# Patient Record
Sex: Male | Born: 1965 | Race: White | Hispanic: No | Smoking: Never smoker
Health system: Southern US, Community
[De-identification: ages and names within clinical notes are randomized; demographics above are authoritative.]

## PROBLEM LIST (undated history)

## (undated) DIAGNOSIS — T7840XA Allergy, unspecified, initial encounter: Secondary | ICD-10-CM

## (undated) DIAGNOSIS — E039 Hypothyroidism, unspecified: Secondary | ICD-10-CM

## (undated) DIAGNOSIS — K828 Other specified diseases of gallbladder: Secondary | ICD-10-CM

## (undated) DIAGNOSIS — T8859XA Other complications of anesthesia, initial encounter: Secondary | ICD-10-CM

## (undated) DIAGNOSIS — F411 Generalized anxiety disorder: Secondary | ICD-10-CM

## (undated) DIAGNOSIS — K229 Disease of esophagus, unspecified: Secondary | ICD-10-CM

## (undated) DIAGNOSIS — E663 Overweight: Secondary | ICD-10-CM

## (undated) DIAGNOSIS — K760 Fatty (change of) liver, not elsewhere classified: Secondary | ICD-10-CM

## (undated) DIAGNOSIS — K5792 Diverticulitis of intestine, part unspecified, without perforation or abscess without bleeding: Secondary | ICD-10-CM

## (undated) DIAGNOSIS — K649 Unspecified hemorrhoids: Secondary | ICD-10-CM

## (undated) DIAGNOSIS — T4145XA Adverse effect of unspecified anesthetic, initial encounter: Secondary | ICD-10-CM

## (undated) DIAGNOSIS — M1990 Unspecified osteoarthritis, unspecified site: Secondary | ICD-10-CM

## (undated) DIAGNOSIS — K219 Gastro-esophageal reflux disease without esophagitis: Secondary | ICD-10-CM

## (undated) DIAGNOSIS — Z87442 Personal history of urinary calculi: Secondary | ICD-10-CM

## (undated) DIAGNOSIS — Z8619 Personal history of other infectious and parasitic diseases: Secondary | ICD-10-CM

## (undated) DIAGNOSIS — K589 Irritable bowel syndrome without diarrhea: Secondary | ICD-10-CM

## (undated) DIAGNOSIS — K910 Vomiting following gastrointestinal surgery: Secondary | ICD-10-CM

## (undated) DIAGNOSIS — I10 Essential (primary) hypertension: Secondary | ICD-10-CM

## (undated) DIAGNOSIS — R112 Nausea with vomiting, unspecified: Secondary | ICD-10-CM

## (undated) HISTORY — DX: Overweight: E66.3

## (undated) HISTORY — DX: Hypothyroidism, unspecified: E03.9

## (undated) HISTORY — DX: Personal history of urinary calculi: Z87.442

## (undated) HISTORY — DX: Gastro-esophageal reflux disease without esophagitis: K21.9

## (undated) HISTORY — DX: Allergy, unspecified, initial encounter: T78.40XA

## (undated) HISTORY — DX: Personal history of other infectious and parasitic diseases: Z86.19

## (undated) HISTORY — PX: LIVER BIOPSY: SHX301

## (undated) HISTORY — DX: Fatty (change of) liver, not elsewhere classified: K76.0

## (undated) HISTORY — DX: Unspecified hemorrhoids: K64.9

## (undated) HISTORY — PX: TONSILLECTOMY AND ADENOIDECTOMY: SUR1326

## (undated) HISTORY — DX: Unspecified osteoarthritis, unspecified site: M19.90

## (undated) HISTORY — DX: Diverticulitis of intestine, part unspecified, without perforation or abscess without bleeding: K57.92

## (undated) HISTORY — DX: Generalized anxiety disorder: F41.1

## (undated) HISTORY — PX: DIAGNOSTIC LAPAROSCOPY: SUR761

## (undated) HISTORY — DX: Irritable bowel syndrome, unspecified: K58.9

---

## 2001-08-31 ENCOUNTER — Encounter: Payer: Self-pay | Admitting: Urology

## 2001-08-31 ENCOUNTER — Encounter: Admission: RE | Admit: 2001-08-31 | Discharge: 2001-08-31 | Payer: Self-pay | Admitting: Urology

## 2001-09-03 ENCOUNTER — Encounter: Payer: Self-pay | Admitting: Urology

## 2001-09-03 ENCOUNTER — Encounter: Admission: RE | Admit: 2001-09-03 | Discharge: 2001-09-03 | Payer: Self-pay | Admitting: Urology

## 2005-08-16 ENCOUNTER — Ambulatory Visit: Payer: Self-pay | Admitting: Pulmonary Disease

## 2005-12-20 ENCOUNTER — Ambulatory Visit: Payer: Self-pay | Admitting: Pulmonary Disease

## 2006-04-06 ENCOUNTER — Ambulatory Visit (HOSPITAL_COMMUNITY): Admission: RE | Admit: 2006-04-06 | Discharge: 2006-04-06 | Payer: Self-pay | Admitting: Pulmonary Disease

## 2006-04-06 ENCOUNTER — Ambulatory Visit: Payer: Self-pay | Admitting: Pulmonary Disease

## 2006-12-19 ENCOUNTER — Ambulatory Visit: Payer: Self-pay | Admitting: Pulmonary Disease

## 2007-02-15 ENCOUNTER — Ambulatory Visit: Payer: Self-pay | Admitting: Pulmonary Disease

## 2007-02-15 LAB — CONVERTED CEMR LAB
ALT: 100 units/L — ABNORMAL HIGH (ref 0–40)
AST: 61 units/L — ABNORMAL HIGH (ref 0–37)
Albumin: 4.1 g/dL (ref 3.5–5.2)
Alkaline Phosphatase: 71 units/L (ref 39–117)
BUN: 11 mg/dL (ref 6–23)
Basophils Absolute: 0 10*3/uL (ref 0.0–0.1)
Basophils Relative: 0.4 % (ref 0.0–1.0)
Bilirubin Urine: NEGATIVE
Bilirubin, Direct: 0.2 mg/dL (ref 0.0–0.3)
CO2: 30 meq/L (ref 19–32)
Calcium: 9.2 mg/dL (ref 8.4–10.5)
Chloride: 102 meq/L (ref 96–112)
Cholesterol: 143 mg/dL (ref 0–200)
Creatinine, Ser: 0.9 mg/dL (ref 0.4–1.5)
Eosinophils Absolute: 0.1 10*3/uL (ref 0.0–0.6)
Eosinophils Relative: 2 % (ref 0.0–5.0)
GFR calc Af Amer: 120 mL/min
GFR calc non Af Amer: 99 mL/min
Glucose, Bld: 96 mg/dL (ref 70–99)
HCT: 42.9 % (ref 39.0–52.0)
HDL: 30.1 mg/dL — ABNORMAL LOW (ref >39.0)
Hemoglobin, Urine: NEGATIVE
Hemoglobin: 15.2 g/dL (ref 13.0–17.0)
LDL Cholesterol: 75 mg/dL (ref 0–99)
Leukocytes, UA: NEGATIVE
Lymphocytes Relative: 40.4 % (ref 12.0–46.0)
MCHC: 35.6 g/dL (ref 30.0–36.0)
MCV: 92.6 fL (ref 78.0–100.0)
Monocytes Absolute: 0.6 10*3/uL (ref 0.2–0.7)
Monocytes Relative: 9.5 % (ref 3.0–11.0)
Neutro Abs: 3.3 10*3/uL (ref 1.4–7.7)
Neutrophils Relative %: 47.7 % (ref 43.0–77.0)
Nitrite: NEGATIVE
Platelets: 269 10*3/uL (ref 150–400)
Potassium: 3.6 meq/L (ref 3.5–5.1)
RBC: 4.63 M/uL (ref 4.22–5.81)
RDW: 11.9 % (ref 11.5–14.6)
Sodium: 139 meq/L (ref 135–145)
Specific Gravity, Urine: 1.025 (ref 1.000–1.03)
TSH: 20.48 microintl units/mL — ABNORMAL HIGH (ref 0.35–5.50)
Total Bilirubin: 1.4 mg/dL — ABNORMAL HIGH (ref 0.3–1.2)
Total CHOL/HDL Ratio: 4.8
Total Protein, Urine: NEGATIVE mg/dL
Total Protein: 7.2 g/dL (ref 6.0–8.3)
Triglycerides: 190 mg/dL — ABNORMAL HIGH (ref 0–149)
Urine Glucose: NEGATIVE mg/dL
Urobilinogen, UA: 0.2 (ref 0.0–1.0)
VLDL: 38 mg/dL (ref 0–40)
WBC: 6.7 10*3/uL (ref 4.5–10.5)
pH: 6 (ref 5.0–8.0)

## 2007-02-19 ENCOUNTER — Ambulatory Visit: Payer: Self-pay | Admitting: Pulmonary Disease

## 2007-06-19 ENCOUNTER — Ambulatory Visit: Payer: Self-pay | Admitting: Pulmonary Disease

## 2007-12-19 DIAGNOSIS — F411 Generalized anxiety disorder: Secondary | ICD-10-CM

## 2007-12-19 DIAGNOSIS — I10 Essential (primary) hypertension: Secondary | ICD-10-CM | POA: Insufficient documentation

## 2007-12-19 DIAGNOSIS — K219 Gastro-esophageal reflux disease without esophagitis: Secondary | ICD-10-CM | POA: Insufficient documentation

## 2007-12-19 DIAGNOSIS — E039 Hypothyroidism, unspecified: Secondary | ICD-10-CM

## 2007-12-19 DIAGNOSIS — K589 Irritable bowel syndrome without diarrhea: Secondary | ICD-10-CM | POA: Insufficient documentation

## 2007-12-19 DIAGNOSIS — K649 Unspecified hemorrhoids: Secondary | ICD-10-CM | POA: Insufficient documentation

## 2008-01-14 ENCOUNTER — Ambulatory Visit: Payer: Self-pay | Admitting: Pulmonary Disease

## 2008-01-14 DIAGNOSIS — E7800 Pure hypercholesterolemia, unspecified: Secondary | ICD-10-CM | POA: Insufficient documentation

## 2008-01-14 DIAGNOSIS — K7689 Other specified diseases of liver: Secondary | ICD-10-CM | POA: Insufficient documentation

## 2008-01-14 DIAGNOSIS — E663 Overweight: Secondary | ICD-10-CM | POA: Insufficient documentation

## 2008-07-08 ENCOUNTER — Ambulatory Visit: Payer: Self-pay | Admitting: Internal Medicine

## 2008-07-08 DIAGNOSIS — J069 Acute upper respiratory infection, unspecified: Secondary | ICD-10-CM | POA: Insufficient documentation

## 2008-07-15 ENCOUNTER — Telehealth: Payer: Self-pay | Admitting: Adult Health

## 2008-07-21 ENCOUNTER — Telehealth (INDEPENDENT_AMBULATORY_CARE_PROVIDER_SITE_OTHER): Payer: Self-pay | Admitting: *Deleted

## 2008-07-22 ENCOUNTER — Ambulatory Visit: Payer: Self-pay | Admitting: Pulmonary Disease

## 2008-07-22 DIAGNOSIS — J209 Acute bronchitis, unspecified: Secondary | ICD-10-CM | POA: Insufficient documentation

## 2008-08-25 ENCOUNTER — Ambulatory Visit: Payer: Self-pay | Admitting: Pulmonary Disease

## 2008-08-27 ENCOUNTER — Ambulatory Visit: Payer: Self-pay | Admitting: Pulmonary Disease

## 2008-08-31 LAB — CONVERTED CEMR LAB
ALT: 62 units/L — ABNORMAL HIGH (ref 0–53)
Basophils Absolute: 0 10*3/uL (ref 0.0–0.1)
Basophils Relative: 0.4 % (ref 0.0–3.0)
Bilirubin Urine: NEGATIVE
CO2: 30 meq/L (ref 19–32)
Calcium: 9.1 mg/dL (ref 8.4–10.5)
Cholesterol: 143 mg/dL (ref 0–200)
Creatinine, Ser: 0.9 mg/dL (ref 0.4–1.5)
GFR calc Af Amer: 120 mL/min
HCT: 43 % (ref 39.0–52.0)
HDL: 19.5 mg/dL — ABNORMAL LOW (ref >39.0)
Hemoglobin: 15.5 g/dL (ref 13.0–17.0)
Ketones, ur: NEGATIVE mg/dL
Leukocytes, UA: NEGATIVE
Lymphocytes Relative: 37.1 % (ref 12.0–46.0)
MCHC: 36 g/dL (ref 30.0–36.0)
MCV: 94.4 fL (ref 78.0–100.0)
Monocytes Absolute: 0.6 10*3/uL (ref 0.1–1.0)
Neutro Abs: 2.7 10*3/uL (ref 1.4–7.7)
RBC: 4.56 M/uL (ref 4.22–5.81)
RDW: 12.8 % (ref 11.5–14.6)
Specific Gravity, Urine: 1.01 (ref 1.000–1.03)
TSH: 6.65 microintl units/mL — ABNORMAL HIGH (ref 0.35–5.50)
Total Bilirubin: 1.6 mg/dL — ABNORMAL HIGH (ref 0.3–1.2)
Total Protein, Urine: NEGATIVE mg/dL
Total Protein: 7.1 g/dL (ref 6.0–8.3)
Triglycerides: 123 mg/dL (ref 0–149)
Urine Glucose: NEGATIVE mg/dL
pH: 7 (ref 5.0–8.0)

## 2008-12-15 ENCOUNTER — Telehealth (INDEPENDENT_AMBULATORY_CARE_PROVIDER_SITE_OTHER): Payer: Self-pay | Admitting: *Deleted

## 2008-12-24 ENCOUNTER — Telehealth (INDEPENDENT_AMBULATORY_CARE_PROVIDER_SITE_OTHER): Payer: Self-pay | Admitting: *Deleted

## 2009-01-01 ENCOUNTER — Telehealth (INDEPENDENT_AMBULATORY_CARE_PROVIDER_SITE_OTHER): Payer: Self-pay | Admitting: *Deleted

## 2009-01-13 ENCOUNTER — Encounter: Payer: Self-pay | Admitting: Adult Health

## 2009-01-13 ENCOUNTER — Ambulatory Visit: Payer: Self-pay | Admitting: Pulmonary Disease

## 2009-01-13 DIAGNOSIS — R300 Dysuria: Secondary | ICD-10-CM | POA: Insufficient documentation

## 2009-01-13 DIAGNOSIS — R1084 Generalized abdominal pain: Secondary | ICD-10-CM | POA: Insufficient documentation

## 2009-01-13 LAB — CONVERTED CEMR LAB
ALT: 39 units/L (ref 0–53)
Bilirubin Urine: NEGATIVE
Bilirubin, Direct: 0.2 mg/dL (ref 0.0–0.3)
CO2: 31 meq/L (ref 19–32)
Calcium: 9.2 mg/dL (ref 8.4–10.5)
Crystals: NEGATIVE
GFR calc Af Amer: 137 mL/min
Hemoglobin, Urine: NEGATIVE
Hemoglobin, Urine: NEGATIVE
Leukocytes, UA: NEGATIVE
Lymphocytes Relative: 20.7 % (ref 12.0–46.0)
Monocytes Absolute: 0.8 10*3/uL (ref 0.1–1.0)
Monocytes Relative: 8.1 % (ref 3.0–12.0)
Neutrophils Relative %: 68.2 % (ref 43.0–77.0)
Nitrite: NEGATIVE
Nitrite: NEGATIVE
Platelets: 271 10*3/uL (ref 150–400)
RDW: 11.8 % (ref 11.5–14.6)
Sodium: 140 meq/L (ref 135–145)
Total Bilirubin: 0.9 mg/dL (ref 0.3–1.2)
Total Protein, Urine: NEGATIVE mg/dL
WBC, UA: NONE SEEN cells/hpf
pH: 6.5 (ref 5.0–8.0)

## 2009-01-19 ENCOUNTER — Telehealth (INDEPENDENT_AMBULATORY_CARE_PROVIDER_SITE_OTHER): Payer: Self-pay | Admitting: *Deleted

## 2009-02-27 ENCOUNTER — Ambulatory Visit: Payer: Self-pay | Admitting: Pulmonary Disease

## 2009-03-02 ENCOUNTER — Ambulatory Visit: Payer: Self-pay | Admitting: Pulmonary Disease

## 2009-03-02 LAB — CONVERTED CEMR LAB: TSH: 92.9 microintl units/mL — ABNORMAL HIGH (ref 0.35–5.50)

## 2009-11-16 ENCOUNTER — Ambulatory Visit: Payer: Self-pay | Admitting: Pulmonary Disease

## 2009-11-23 ENCOUNTER — Telehealth (INDEPENDENT_AMBULATORY_CARE_PROVIDER_SITE_OTHER): Payer: Self-pay | Admitting: *Deleted

## 2009-12-12 HISTORY — PX: KNEE SURGERY: SHX244

## 2010-01-11 ENCOUNTER — Telehealth: Payer: Self-pay | Admitting: Pulmonary Disease

## 2010-01-13 ENCOUNTER — Telehealth (INDEPENDENT_AMBULATORY_CARE_PROVIDER_SITE_OTHER): Payer: Self-pay | Admitting: *Deleted

## 2010-03-19 ENCOUNTER — Ambulatory Visit: Payer: Self-pay | Admitting: Pulmonary Disease

## 2010-03-19 LAB — CONVERTED CEMR LAB
Albumin: 4.4 g/dL (ref 3.5–5.2)
BUN: 10 mg/dL (ref 6–23)
Calcium: 9.3 mg/dL (ref 8.4–10.5)
Cholesterol: 130 mg/dL (ref 0–200)
Eosinophils Relative: 3 % (ref 0.0–5.0)
GFR calc non Af Amer: 97.82 mL/min (ref >60)
Glucose, Bld: 94 mg/dL (ref 70–99)
HCT: 47.8 % (ref 39.0–52.0)
HDL: 32.6 mg/dL — ABNORMAL LOW (ref >39.00)
Hemoglobin: 16.9 g/dL (ref 13.0–17.0)
Ketones, ur: NEGATIVE mg/dL
Leukocytes, UA: NEGATIVE
Lymphocytes Relative: 37.6 % (ref 12.0–46.0)
Lymphs Abs: 2.6 10*3/uL (ref 0.7–4.0)
Monocytes Relative: 11.6 % (ref 3.0–12.0)
Neutro Abs: 3.3 10*3/uL (ref 1.4–7.7)
Platelets: 250 10*3/uL (ref 150.0–400.0)
Specific Gravity, Urine: 1.015 (ref 1.000–1.030)
TSH: 0.61 microintl units/mL (ref 0.35–5.50)
Triglycerides: 94 mg/dL (ref 0.0–149.0)
VLDL: 18.8 mg/dL (ref 0.0–40.0)
WBC: 6.9 10*3/uL (ref 4.5–10.5)
pH: 7 (ref 5.0–8.0)

## 2010-09-27 ENCOUNTER — Telehealth: Payer: Self-pay | Admitting: Pulmonary Disease

## 2010-10-26 ENCOUNTER — Encounter: Payer: Self-pay | Admitting: Cardiovascular Disease

## 2010-10-26 ENCOUNTER — Ambulatory Visit: Payer: Self-pay

## 2010-10-26 DIAGNOSIS — M7989 Other specified soft tissue disorders: Secondary | ICD-10-CM

## 2011-01-02 ENCOUNTER — Encounter: Payer: Self-pay | Admitting: Pulmonary Disease

## 2011-01-11 NOTE — Progress Notes (Signed)
Summary: rx  Phone Note Call from Patient Call back at Home Phone [phone]   Caller: Patient Call For: nadel Reason for Call: Talk to Nurse Summary of Call: sinus drainage, has been stopped up, cough, not in chest.  Can you call something in? [facility] Initial call taken by: Eugene Gavia,  January 11, 2010 1:55 PM  Follow-up for Phone Call        called and lmomtcb for pt. [name] CMA  January 11, 2010 1:57 PM   started friday night with congestion---stopped up nose--using advil sinus meds---drainage started 4-5 hours ago---clear drainage--had avelox last month for the same thing---please advise.  thanks  allergies--pcn [name] CMA  January 11, 2010 2:09 PM   Additional Follow-up for Phone Call Additional follow up Details #1::        probably viral , needs to use mucinex, saline REC: zpack to have on hold if symptoms worsen with discolored mucus  Zpack #1 take as directed , no refills Please contact office for sooner follow up if symptoms do not improve or worsen  Additional Follow-up by: Rubye Oaks NP,  January 11, 2010 2:23 PM    Additional Follow-up for Phone Call Additional follow up Details #2::    called and spoke with pt ---he is aware per TP recs--will hold the zpak and take only if he needs it with change in nasal drainage---use mucinex and nasal saline spray.  pt instructed to call back later this week if not better [name] CMA  January 11, 2010 2:27 PM   New/Updated Medications: ZITHROMAX Z-PAK 250 MG TABS (AZITHROMYCIN) take as directed Prescriptions: ZITHROMAX Z-PAK 250 MG TABS (AZITHROMYCIN) take as directed  #1 pak x 0   Entered by:   [name] CMA   Authorized by:   [name] MD   Signed by:   [name] CMA on 01/11/2010   Method used:   Electronically to        [facility] [address] [phone]* (retail)       [address], [place]  [address]       Ph: [phone]       Fax: [phone]   RxID:    [id number]

## 2011-01-11 NOTE — Assessment & Plan Note (Signed)
Summary: cpx/fasting/apc   CC:  Yearly ROv & CPX....  History of Present Illness: 45 y/o WM here for a follow up visit & CPX... he has multiple medical problems as noted below...    ~  March 02, 2009:  he informs me that he now has a new Engineering geologist thru work where he pays the first $6,000 out of pocket (w/ a health savings acct helping) & lower premiums for the rest of the plan (catastrophic coverage)... he has to pay for everything initially, meds included, except for a Physical Exam (?including CXR, EKG, labs), which is covered completely once per year... we discussed his med Rx- Levothyroid & Hyzaar- he will check w/ Pharm re: cash cost of the ARB's.   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  also has some right lat epicondylitis pain- try soaks, heat MOBIC (may need injection)... BP appears controlled on medication;  trying to control Chol w/ diet alone & intol to Cres10 last yr;  weight is about the same and desperately needs to get wt down, esp in view of his steatosis.    Current Problem List:  HYPERTENSION (ICD-401.9) - controlled on HYZAAR 100-25 daily... BP=114/80 today, & he states OK at home  ~130's to 140/ 80's-90... denies HA, fatigue, visual changes, CP, palipit, dizziness, syncope, dyspnea, edema, etc...   ~  CXR 4/11 showed low lung vol's but clear, normal Cor, NAD.  ~  EKG showed NSR rate 60, no signif STTWA's, WNL.  HYPERCHOLESTEROLEMIA (ICD-272.0) - he has refused meds preferring to attempt control on diet alone... he was intol to Niacin in past w/ flushing... intol to Crestor10 when tried in Sep09...  ~  FLP 3/08 showed TChol 143, TG 190, HDL 30, LDL 75... rec to start fibrate- he declined meds.  ~  FLP  9/09 showed TChol 143, TG 123, HDL 20, LDL 99... rec to start Crestor10mg /d- he agrees.  ~  pt states that he didn't tolerate the Crestor10 either, therefore stopped it.  ~  FLP 4/11 showed TChol 130, TG 94, HDL 33, LDL 79... much improved, on diet alone.  HYPOTHYROIDISM (ICD-244.9) - on SYNTHROID 260mcg/d w/ ?compliance issues...   ~  labs 3/08 showed TSH=20.5 on Synthroid 174mcg/d and dose increased to 200.  ~  labs 9/09 showed TSH= 6.65 on 231mcg/d... continue same med.  ~  ran out of meds Feb10 and didn't refill- labs 3/10 showed TSH= 93... rec> restart LEVOTHY200.  ~  labs 4/11 showed TSH= 0.61... clinically euthyroid, may need to cut back dose.  OVERWEIGHT (ICD-278.02) - since we first met [name] in 1997 his weight has been 220-230 range... occas down to 210-215, and once down to 205# in 2001... hightest weight has been 232#.[name].  ~  weight 3/10 = 230#  ~  weight 4/11 = 228#  GERD (ICD-530.81) - uses OTC Prilosec as needed.  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 he uses OTC PrepH.  FATTY LIVER DISEASE (ICD-571.8) -  he has hepatic steatosis w/ prev GI eval 1999 by [facility] including a liver Bx... there is a pos family hx of cirrhosis in his father who died from  this disease... he has been counselled on weight reduction and told to avoid hepatotoxins including alcohol...  he has discussed Hepatic Steatosis/ Fatty Liver Disease w/ GI [facility]), and we have provided him w/ mult hand-out about this condition...  he understands that it can lead to cirrhosis and death from liver failure... he understands that he must get his weight down, control his fat intake, and follow the FLP & LFT's carefully...  ~  LFT's 3/08 showed SGOT= 61, SGPT= 100... rec> get wt down.  ~  LFT's 9/09 showed SGOT= 43, SGPT= 62... improved.  ~  LFT's 2/10 showed SGOT= 29, SGPT= 39... normal!  ~  LFT's 4/11 showed SGOT= 60, SGPT= 97... must diet & get wt  down!  ANXIETY (ICD-300.00) - uses Alprazolam Prn...  DERM - mult tattoos...  Health Maintenance -   ~  GU:  age 68, PSA checked 2/10 due to symptoms and normal at 0.85...  ~  GI:  had FlexSig in 1998 for IBS symptoms by [facility] & it was neg...  ~  Immunizations:  ? last Tetanus shot (he agrees to TDAP today)... hasn't had Pneumovax, Flu shots (refuses), or HepB series...   Allergies: 1)  ! Penicillin 2)  ! Niacin 3)  ! Crestor (Rosuvastatin Calcium)  Comments:  Nurse/Medical Assistant: The patient's medications and allergies were reviewed with the patient and were updated in the Medication and Allergy Lists.  Past History:  Past Medical History: HYPERTENSION (ICD-401.9) HYPERCHOLESTEROLEMIA (ICD-272.0) HYPOTHYROIDISM (ICD-244.9) OVERWEIGHT (ICD-278.02) GERD (ICD-530.81) IRRITABLE BOWEL SYNDROME (ICD-564.1) HEMORRHOIDS (ICD-455.6) FATTY LIVER DISEASE (ICD-571.8) ANXIETY (ICD-300.00)  Family History: Reviewed history from 11/16/2009 and no changes required. allergies - mother, MGM heart disease - MGF rheumatism - father cancer - father (kidney) DM - father, PGF  Social History: Reviewed history from 03/02/2009 and no changes required. Married Children Never Smoked Social alcohol Works for [employer]  Review of Systems       The patient complains of joint pain and paresthesias.  The patient denies fever, chills, sweats, anorexia, fatigue, weakness, malaise, weight loss, sleep disorder, blurring, diplopia, eye irritation, eye discharge, vision loss, eye pain, photophobia, earache, ear discharge, tinnitus, decreased hearing, nasal congestion, nosebleeds, sore throat, hoarseness, chest pain, palpitations, syncope, dyspnea on exertion, orthopnea, PND, peripheral edema, cough, dyspnea at rest, excessive sputum, hemoptysis, wheezing, pleurisy, nausea, vomiting, diarrhea, constipation, change in bowel habits, abdominal pain, melena, hematochezia, jaundice,  gas/bloating, indigestion/heartburn, dysphagia, odynophagia, dysuria, hematuria, urinary frequency, urinary hesitancy, nocturia, incontinence, back pain, joint swelling, muscle cramps, muscle weakness, stiffness, arthritis, sciatica, restless legs, leg pain at night, leg pain with exertion, rash, itching, dryness, suspicious lesions, paralysis, seizures, tremors, vertigo, transient blindness, frequent falls, frequent headaches, difficulty walking, depression, anxiety, memory loss, confusion, cold intolerance, heat intolerance, polydipsia, polyphagia, polyuria, unusual weight change, abnormal bruising, bleeding, enlarged lymph nodes, urticaria, allergic rash, hay fever, and recurrent infections.    Vital Signs:  Patient profile:   45 year old male Height:      69 inches Weight:      227.31 pounds BMI:     33.69 O2 Sat:      99 % on Room air Temp:     96.8 degrees F oral Pulse rate:   62 / minute BP sitting:   114 / 80  (right arm) Cuff size:   regular  Vitals Entered By: [name] CMA (March 19, 2010 8:55 AM)  O2 Sat at Rest %:  99 O2 Flow:  Room air  CC: Yearly  ROv & CPX... Is Patient Diabetic? No Pain Assessment Patient in pain? yes      Comments meds updated today   Physical Exam  Additional Exam:  WD, Overweight, 45 y/o WM in NAD... GENERAL:  Alert & oriented; pleasant & cooperative... HEENT:  [place]/AT, EOM-wnl, PERRLA, EACs-clear, TMs-wnl, NOSE-clear, THROAT-clear & wnl... NECK:  Supple w/ full ROM; no JVD; normal carotid impulses w/o bruits; no thyromegaly or nodules palpated; no lymphadenopathy... CHEST:  Clear to P & A; without wheezes/ rales/ or rhonchi heard... HEART:  Regular Rhythm; without murmurs/ rubs/ or gallops detected... ABDOMEN:  Soft & nontender; normal bowel sounds; no organomegaly or masses palpated... RECTAL:  neg- prostate 2+ normal, stool heme neg, few ext hem's noted... EXT: without deformities, mild arthritic changes;   NEURO:  CN's intact; motor  testing normal; sensory testing normal; gait normal & balance OK. DERM:  No lesions noted; no rash, several tatoos...    CXR  Procedure date:  03/19/2010  Findings:      HEST - 2 VIEW Comparison: [facility] chest x-rays 07/22/2008 and 03/02/2009.   Findings: Persistent low lung volumes with clear lungs and normal heart size seen.  Mediastinum, hila, pleura and osseous structures appear normal for age.   IMPRESSION: Stable - no active disease.   Read By:  Barnie Del,  M.D.   EKG  Procedure date:  03/19/2010  Findings:      Normal sinus rhythm with rate of:  60/ min... Tracing is WNL, NAD...  SN   MISC. Report  Procedure date:  03/19/2010  Findings:      Lipid Panel (LIPID)   Cholesterol               130 mg/dL                   1-610   Triglycerides             94.0 mg/dL                  9.6-045.4   HDL                  [L]  09.81 mg/dL                 >19.14   LDL Cholesterol           79 mg/dL                    7-82         BMP (METABOL)   Sodium                    140 mEq/L                   135-145   Potassium                 3.6 mEq/L                   3.5-5.1   Chloride                  100 mEq/L                   96-112   Carbon Dioxide            31 mEq/L  19-32   Glucose                   94 mg/dL                    16-10   BUN                       10 mg/dL                    9-60   Creatinine                0.9 mg/dL                   4.5-4.0   Calcium                   9.3 mg/dL                   9.8-11.9   GFR                       97.82 mL/min                >60   Hepatic/Liver Function Panel (HEPATIC)   Total Bilirubin      [H]  1.6 mg/dL                   1.4-7.8   Direct Bilirubin          0.2 mg/dL                   2.9-5.6   Alkaline Phosphatase      61 U/L                      39-117   AST                  [H]  60 U/L                      0-37   ALT                  [H]  97 U/L                       0-53   Total Protein             7.8 g/dL                    2.1-3.0   Albumin                   4.4 g/dL                    8.6-5.7  Comments:      CBC Platelet w/Diff (CBCD)   White Cell Count          6.9 K/uL                    4.5-10.5   Red Cell Count            5.05 Mil/uL                 4.22-5.81   Hemoglobin                16.9 g/dL  13.0-17.0   Hematocrit                47.8 %                      39.0-52.0   MCV                       94.6 fl                     78.0-100.0   Platelet Count            250.0 K/uL                  150.0-400.0   Neutrophil %              47.4 %                      43.0-77.0   Lymphocyte %              37.6 %                      12.0-46.0   Monocyte %                11.6 %                      3.0-12.0   Eosinophils%              3.0 %                       0.0-5.0   Basophils %               0.4 %                       0.0-3.0   TSH (TSH)   FastTSH                   0.61 uIU/mL                 0.35-5.50   UDip w/Micro (URINE)   Color                     YELLOW   Clarity                   CLEAR                       Clear   Specific Gravity          1.015                       1.000 - 1.030   Urine Ph                  7.0                         5.0-8.0   Protein                   NEGATIVE                    Negative   Urine Glucose             NEGATIVE  Negative   Ketones                   NEGATIVE                    Negative   Urine Bilirubin           NEGATIVE                    Negative   Blood                     NEGATIVE                    Negative   Urobilinogen              0.2                         0.0 - 1.0   Leukocyte Esterace        NEGATIVE                    Negative   Nitrite                   NEGATIVE                    Negative   Urine WBC                 0-2/hpf                     0-2/hpf   Urine RBC                 0-2/hpf                     0-2/hpf   Urine Epith                Rare(0-4/hpf)               Rare(0-4/hpf)   Urine Bacteria            Rare(<10/hpf)               None   Impression & Recommendations:  Problem # 1:  PHYSICAL EXAMINATION (ICD-V70.0)  Orders: EKG w/ Interpretation (93000) T-2 View CXR (71020TC) TLB-Lipid Panel (80061-LIPID) TLB-BMP (Basic Metabolic Panel-BMET) (80048-METABOL) TLB-Hepatic/Liver Function Pnl (80076-HEPATIC) TLB-CBC Platelet - w/Differential (85025-CBCD) TLB-TSH (Thyroid Stimulating Hormone) (84443-TSH) TLB-Udip w/ Micro (81001-URINE)  Problem # 2:  HYPERTENSION (ICD-401.9) Controlled on Rx... continue same. His updated medication list for this problem includes:    Hyzaar 100-25 Mg Tabs (Losartan potassium-hctz) .[name]... Take 1 tab by mouth once daily...  Problem # 3:  HYPERCHOLESTEROLEMIA (ICD-272.0) Improved on his diet Rx... HDL still sl low- rec incr exercise!!!  Problem # 4:  HYPOTHYROIDISM (ICD-244.9) Amazing how well the meds work when you take them regularly... clinically euthyroid & TSH sl suppressed... continue same for now, we will watch... His updated medication list for this problem includes:    Levothyroxine Sodium 200 Mcg Tabs (Levothyroxine sodium) .[name]... Take 1 tab by mouth once daily...  Problem # 5:  FATTY LIVER DISEASE (ICD-571.8) LFT's are back up and diet, weight reduction are his only hope here- we discussed this in detail & he understands...  Problem # 6:  OVERWEIGHT (ICD-278.02) Weight reduction is key!!!  Problem # 7:  OTHER MEDICAL  PROBLEMS AS NOTED>>> OK TDAP today...  Complete Medication List: 1)  Adult Aspirin Low Strength 81 Mg Tbdp (Aspirin) .[name].. 1 tab daily.Marland KitchenMarland Kitchen 2)  Hyzaar 100-25 Mg Tabs (Losartan potassium-hctz) .... Take 1 tab by mouth once daily.Marland KitchenMarland Kitchen 3)  Levothyroxine Sodium 200 Mcg Tabs (Levothyroxine sodium) .... Take 1 tab by mouth once daily.Marland KitchenMarland Kitchen 4)  Multivitamins Tabs (Multiple vitamin) .... Take 1 tablet by mouth once a day 5)  Vitamin B-12 1000 Mcg Tabs (Cyanocobalamin) ....  Take 1 tablet by mouth once a day 6)  Meloxicam 7.5 Mg Tabs (Meloxicam) .... Take 1 tab by mouth once daily as needed for arthritis pain...  Other Orders: Prescription Created Electronically [phone]) Tdap => 32yrs IM (47829) Admin 1st Vaccine (56213)  Patient Instructions: 1)  Today we updated your med list- see below.... 2)  We refilled your meds for 90d supplies as requested... 3)  We wrote a new perscription for generic Mobic- take one tab daily w/ a meal as needed for arthritis/ bursitis/ tendonitis pain.Marland KitchenMarland Kitchen 4)  For your left elbow (prob ulnar neuropathy): you need to keep off the elbow while keyboarding, driving, etc... try a bulky elbow pad. 5)  For your right elbow pain (lat epicondylitis= "tennis elbow":  try the Mobic + hot soaks, etc... if symptoms persist we can ask Dr[facility]le to inject the area for you. 6)  Today we did your follow up CXR, EKG, and FASTING blood work... please call the "phone tree" in a few days for your lab results.Marland KitchenMarland Kitchen 7)  Remember to get on that diet & get the weight down... goal is to lose 15-20 lbs... 8)  Today we gave you a TETANUS vaccination (good for 16yrs). 9)  Please collect the "stool cards" at your convenience so that we can check for hidden blood... 10)  Call w/ ant questions... 11)  Please schedule a follow-up appointment in 1 year. Prescriptions: MELOXICAM 7.5 MG TABS (MELOXICAM) take 1 tab by mouth once daily as needed for arthritis pain...  #90 x 4   Entered and Authorized by:   [name] MD   Signed by:   [name] MD on 03/19/2010   Method used:   Print then Give to Patient   RxID:   [id number] LEVOTHYROXINE SODIUM 200 MCG TABS (LEVOTHYROXINE SODIUM) take 1 tab by mouth once daily...  #90 x 4   Entered and Authorized by:   [name] MD   Signed by:   [name] MD on 03/19/2010   Method used:   Print then Give to Patient   RxID:   [id number] HYZAAR 100-25 MG TABS (LOSARTAN POTASSIUM-HCTZ) take 1 tab by mouth once  daily...  #90 x 4   Entered and Authorized by:   [name] MD   Signed by:   [name] MD on 03/19/2010   Method used:   Print then Give to Patient   RxID:   [id number]    CardioPerfect ECG  ID: [id number] Patient: Paul Shannon A DOB: [birth year] Age: 45 Years Old Sex: Male Race: White Physician: Amiya Escamilla Technician: [name] CMA Height: 69 Weight: 227.31 Status: Unconfirmed Past Medical History:  HYPERTENSION (ICD-401.9) HYPERCHOLESTEROLEMIA (ICD-272.0) HYPOTHYROIDISM (ICD-244.9) OVERWEIGHT (ICD-278.02) GERD (ICD-530.81) IRRITABLE BOWEL SYNDROME (ICD-564.1) HEMORRHOIDS (ICD-455.6) FATTY LIVER DISEASE (ICD-571.8) ANXIETY (ICD-300.00)   Recorded: 03/19/2010 09:07 AM P/PR: 127 ms / 173 ms - Heart rate (maximum exercise) QRS: 110 QT/QTc/QTd: 427 ms / 427 ms / 42 ms - Heart rate (maximum exercise)  P/QRS/T  axis: 27 deg / 69 deg / 17 deg - Heart rate (maximum exercise)  Heartrate: 60 bpm  Interpretation:  Normal sinus rhythm with rate of:  60/ min... Tracing is WNL, NAD...  SN     Immunizations Administered:  Tetanus Vaccine:    Vaccine Type: Tdap    Site: left deltoid    Mfr: boostrix    Dose: 0.5 ml    Route: IM    Given by: [name] CMA    Exp. Date: 03/05/2012    Lot #: [id number]    VIS given: 10/30/07 version given March 19, 2010.

## 2011-01-11 NOTE — Progress Notes (Signed)
Summary: cough  Phone Note Call from Patient   Caller: Patient Call For: nadel Summary of Call: pt need something for a cough [facility]  [place] Initial call taken by: [name],  January 13, 2010 8:24 AM  Follow-up for Phone Call        The patient states he did not pick up abx and doesn't think he needs it but does c/o drainage and a dry hacky cough. He would like something called in for the cough. Please advise.[name] CMA  January 13, 2010 9:38 AM  Additional Follow-up for Phone Call Additional follow up Details #1::        per SN---use mucinex max 1200mg  1 by mouth two times a day with plenty of fluids and tussionex  #4oz  1 tsp by mouth every 12 hours as needed for cough.  thanks [name] CMA  January 13, 2010 2:24 PM   Patient is aware of SN recs and will call if his sxs do not improve or get worse.[name] CMA  January 13, 2010 2:32 PM    New/Updated Medications: MUCINEX MAXIMUM STRENGTH 1200 MG XR12H-TAB (GUAIFENESIN) 1 by mouth two times a day TUSSIONEX PENNKINETIC ER 8-10 MG/5ML LQCR (CHLORPHENIRAMINE-HYDROCODONE) 1 tsp every 12 hours as needed for cough Prescriptions: TUSSIONEX PENNKINETIC ER 8-10 MG/5ML LQCR (CHLORPHENIRAMINE-HYDROCODONE) 1 tsp every 12 hours as needed for cough  #4 oz x 0   Entered by:   [name] CMA   Authorized by:   [name] MD   Signed by:   [name] CMA on 01/13/2010   Method used:   Telephoned to ...       [facility] [address] [phone]* (retail)       [address] [place]  [address]       Ph: [phone]       Fax: [phone]   RxID:   [id number]

## 2011-01-11 NOTE — Progress Notes (Signed)
Summary: prescription  Phone Note Call from Patient Call back at Home Phone [phone]   Caller: Patient Call For: nadel Summary of Call: Wants a rx for zpak.//[facility] Initial call taken by: Darletta Moll,  September 27, 2010 8:23 AM  Follow-up for Phone Call        called and spoke with pt.  pt states she is scheduled to have knee surgery this weds and believes he is coming down with a sinus infection.  Pt requests abx.  Sx started on Friday 09/17/2010.  C/o facial presure, nasal congestion, PND, and sore throat. Pt denied fever or chest congestion.  Pt has tried OTC Zyrtec and antihistamines with no relief of symptoms.  Will forward message to SN to address.  Aundra Millet Reynolds LPN  September 27, 2010 9:05 AM  allergies: PCN, Niacin, Crestor  Additional Follow-up for Phone Call Additional follow up Details #1::        per SN---ok for pt to have zpak #1   take as directec until gone. thanks [name] CMA  September 27, 2010 12:30 PM     called and spoke with pt and he is aware of zpak sent to the pharmacy [name] CMA  September 27, 2010 12:49 PM     New/Updated Medications: ZITHROMAX Z-PAK 250 MG TABS (AZITHROMYCIN) take as directed Prescriptions: ZITHROMAX Z-PAK 250 MG TABS (AZITHROMYCIN) take as directed  #1 pak x 0   Entered by:   [name] CMA   Authorized by:   [name] MD   Signed by:   [name] CMA on 09/27/2010   Method used:   Faxed to ...       [facility] (retail)       [address]       [place], [place]    [place]       Ph: [phone]       Fax: [phone]   RxID:   [id number]

## 2011-01-11 NOTE — Miscellaneous (Signed)
Summary: Orders Update  Clinical Lists Changes  Problems: Added new problem of SWELLING OF LIMB (ICD-729.81) Orders: Added new Test order of Venous Duplex Lower Extremity (Venous Duplex Lower) - Signed 

## 2011-01-21 ENCOUNTER — Telehealth (INDEPENDENT_AMBULATORY_CARE_PROVIDER_SITE_OTHER): Payer: Self-pay | Admitting: *Deleted

## 2011-01-27 NOTE — Progress Notes (Signed)
Summary: congestion > z pak  Phone Note Call from Patient Call back at [facility] Phone [phone]   Caller: Patient Call For: nadel Summary of Call: Pt c/o pressure in sinus congestion  since yesterday wants a z-pak called in pls advsie.//[facility] [address] [place] Initial call taken by: Darletta Moll,  January 21, 2011 10:18 AM  Follow-up for Phone Call        Spoke with pt and he is c/o sinus pressure, head congestion, and sinus drainage x 2 days. Pt requesting zpak. Please advise. Carron Curie CMA  January 21, 2011 10:44 AM  PER SN, call in Zpak #1, use as directed.[name] [facility]  January 21, 2011 4:06 PM   Additional Follow-up for Phone Call Additional follow up Details #1::        Called, spoke with pt.  He is aware SN recs z pak - take as directed.  He verbalized understanding and aware rx sent to [facility].  Will call back if sxs worsen or do not improve. Additional Follow-up by: Gweneth Dimitri RN,  January 21, 2011 4:28 PM    New/Updated Medications: ZITHROMAX Z-PAK 250 MG TABS (AZITHROMYCIN) take as directed Prescriptions: ZITHROMAX Z-PAK 250 MG TABS (AZITHROMYCIN) take as directed  #1 x 0   Entered by:   Gweneth Dimitri RN   Authorized by:   [name] MD   Signed by:   Gweneth Dimitri RN on 01/21/2011   Method used:   Faxed to ...       [facility] (retail)       [address]       [place], [place]    [place]       Ph: [phone]       Fax: [phone]   RxID:   [id number]

## 2011-04-19 ENCOUNTER — Emergency Department (HOSPITAL_COMMUNITY): Payer: BC Managed Care – PPO

## 2011-04-19 ENCOUNTER — Inpatient Hospital Stay (HOSPITAL_COMMUNITY)
Admission: EM | Admit: 2011-04-19 | Discharge: 2011-04-23 | DRG: 182 | Disposition: A | Discharge status: Home / Self Care | Payer: BC Managed Care – PPO | Attending: Internal Medicine | Admitting: Internal Medicine

## 2011-04-19 DIAGNOSIS — E039 Hypothyroidism, unspecified: Secondary | ICD-10-CM | POA: Diagnosis present

## 2011-04-19 DIAGNOSIS — Z880 Allergy status to penicillin: Secondary | ICD-10-CM

## 2011-04-19 DIAGNOSIS — K5732 Diverticulitis of large intestine without perforation or abscess without bleeding: Principal | ICD-10-CM | POA: Diagnosis present

## 2011-04-19 DIAGNOSIS — E538 Deficiency of other specified B group vitamins: Secondary | ICD-10-CM | POA: Diagnosis present

## 2011-04-19 DIAGNOSIS — I10 Essential (primary) hypertension: Secondary | ICD-10-CM | POA: Diagnosis present

## 2011-04-19 DIAGNOSIS — E669 Obesity, unspecified: Secondary | ICD-10-CM | POA: Diagnosis present

## 2011-04-19 DIAGNOSIS — K7689 Other specified diseases of liver: Secondary | ICD-10-CM | POA: Diagnosis present

## 2011-04-19 DIAGNOSIS — Z7982 Long term (current) use of aspirin: Secondary | ICD-10-CM

## 2011-04-19 HISTORY — DX: Essential (primary) hypertension: I10

## 2011-04-19 LAB — BASIC METABOLIC PANEL
CO2: 26 mEq/L (ref 19–32)
Calcium: 9.4 mg/dL (ref 8.4–10.5)
Chloride: 99 mEq/L (ref 96–112)
GFR calc Af Amer: >60 mL/min (ref >60)
Sodium: 137 mEq/L (ref 135–145)

## 2011-04-19 LAB — HEPATIC FUNCTION PANEL
ALT: 82 U/L — ABNORMAL HIGH (ref 0–53)
Albumin: 4.1 g/dL (ref 3.5–5.2)
Alkaline Phosphatase: 81 U/L (ref 39–117)
Total Bilirubin: 1.3 mg/dL — ABNORMAL HIGH (ref 0.3–1.2)
Total Protein: 7.6 g/dL (ref 6.0–8.3)

## 2011-04-19 LAB — DIFFERENTIAL
Basophils Absolute: 0 10*3/uL (ref 0.0–0.1)
Lymphocytes Relative: 10 % — ABNORMAL LOW (ref 12–46)
Monocytes Relative: 8 % (ref 3–12)
Neutro Abs: 13.8 10*3/uL — ABNORMAL HIGH (ref 1.7–7.7)
Neutrophils Relative %: 82 % — ABNORMAL HIGH (ref 43–77)

## 2011-04-19 LAB — CBC
HCT: 45 % (ref 39.0–52.0)
Hemoglobin: 17 g/dL (ref 13.0–17.0)
WBC: 16.8 10*3/uL — ABNORMAL HIGH (ref 4.0–10.5)

## 2011-04-19 LAB — URINALYSIS, ROUTINE W REFLEX MICROSCOPIC
Glucose, UA: NEGATIVE mg/dL
Hgb urine dipstick: NEGATIVE
Specific Gravity, Urine: 1.024 (ref 1.005–1.030)
Urobilinogen, UA: 1 mg/dL (ref 0.0–1.0)

## 2011-04-19 LAB — PROTIME-INR: Prothrombin Time: 13.6 seconds (ref 11.6–15.2)

## 2011-04-19 MED ORDER — IOHEXOL 300 MG/ML  SOLN
80.0000 mL | Freq: Once | INTRAMUSCULAR | Status: AC | PRN
  Administered 2011-04-19: 80 mL via INTRAVENOUS

## 2011-04-20 LAB — COMPREHENSIVE METABOLIC PANEL
Alkaline Phosphatase: 60 U/L (ref 39–117)
BUN: 8 mg/dL (ref 6–23)
CO2: 26 mEq/L (ref 19–32)
Chloride: 101 mEq/L (ref 96–112)
Creatinine, Ser: 0.63 mg/dL (ref 0.4–1.5)
GFR calc non Af Amer: >60 mL/min (ref >60)
Glucose, Bld: 99 mg/dL (ref 70–99)
Potassium: 3.3 mEq/L — ABNORMAL LOW (ref 3.5–5.1)
Total Bilirubin: 2.4 mg/dL — ABNORMAL HIGH (ref 0.3–1.2)

## 2011-04-20 LAB — GLUCOSE, CAPILLARY: Glucose-Capillary: 134 mg/dL — ABNORMAL HIGH (ref 70–99)

## 2011-04-20 LAB — CBC
HCT: 38.4 % — ABNORMAL LOW (ref 39.0–52.0)
MCH: 32.3 pg (ref 26.0–34.0)
MCV: 89.9 fL (ref 78.0–100.0)
Platelets: 172 10*3/uL (ref 150–400)
RBC: 4.27 MIL/uL (ref 4.22–5.81)
WBC: 16.5 10*3/uL — ABNORMAL HIGH (ref 4.0–10.5)

## 2011-04-21 ENCOUNTER — Telehealth: Payer: Self-pay | Admitting: Pulmonary Disease

## 2011-04-21 ENCOUNTER — Inpatient Hospital Stay (HOSPITAL_COMMUNITY): Payer: BC Managed Care – PPO

## 2011-04-21 LAB — DIFFERENTIAL
Basophils Absolute: 0 10*3/uL (ref 0.0–0.1)
Basophils Relative: 0 % (ref 0–1)
Eosinophils Absolute: 0.1 10*3/uL (ref 0.0–0.7)
Monocytes Relative: 8 % (ref 3–12)
Neutro Abs: 8.6 10*3/uL — ABNORMAL HIGH (ref 1.7–7.7)
Neutrophils Relative %: 79 % — ABNORMAL HIGH (ref 43–77)

## 2011-04-21 LAB — BASIC METABOLIC PANEL
Chloride: 103 mEq/L (ref 96–112)
Creatinine, Ser: 0.69 mg/dL (ref 0.4–1.5)
GFR calc Af Amer: >60 mL/min (ref >60)
Sodium: 137 mEq/L (ref 135–145)

## 2011-04-21 LAB — GLUCOSE, CAPILLARY: Glucose-Capillary: 85 mg/dL (ref 70–99)

## 2011-04-21 LAB — CBC
Hemoglobin: 13.7 g/dL (ref 13.0–17.0)
MCH: 32.1 pg (ref 26.0–34.0)
Platelets: 162 10*3/uL (ref 150–400)
RBC: 4.27 MIL/uL (ref 4.22–5.81)
WBC: 10.9 10*3/uL — ABNORMAL HIGH (ref 4.0–10.5)

## 2011-04-21 NOTE — Telephone Encounter (Signed)
Will forward to Dr. Kriste Basque to make him aware pt would like him to call regarding pt dx. Please advise Dr. Kriste Basque. Thanks  Carver Fila, CMA

## 2011-04-22 ENCOUNTER — Inpatient Hospital Stay (HOSPITAL_COMMUNITY): Payer: BC Managed Care – PPO

## 2011-04-22 ENCOUNTER — Encounter (HOSPITAL_COMMUNITY): Payer: Self-pay | Admitting: Radiology

## 2011-04-22 LAB — CBC
HCT: 40.1 % (ref 39.0–52.0)
Platelets: 216 10*3/uL (ref 150–400)
RBC: 4.52 MIL/uL (ref 4.22–5.81)
RDW: 12.3 % (ref 11.5–15.5)
WBC: 8.3 10*3/uL (ref 4.0–10.5)

## 2011-04-22 LAB — BASIC METABOLIC PANEL
Chloride: 104 mEq/L (ref 96–112)
GFR calc non Af Amer: >60 mL/min (ref >60)
Potassium: 3.6 mEq/L (ref 3.5–5.1)
Sodium: 138 mEq/L (ref 135–145)

## 2011-04-22 MED ORDER — IOHEXOL 300 MG/ML  SOLN
100.0000 mL | Freq: Once | INTRAMUSCULAR | Status: AC | PRN
  Administered 2011-04-22: 100 mL via INTRAVENOUS

## 2011-04-22 NOTE — Telephone Encounter (Signed)
Dr. Kriste Basque has spoken with pt in the hospital

## 2011-04-23 LAB — CBC
HCT: 39.3 % (ref 39.0–52.0)
Hemoglobin: 14.5 g/dL (ref 13.0–17.0)
WBC: 7.8 10*3/uL (ref 4.0–10.5)

## 2011-04-23 LAB — BASIC METABOLIC PANEL
CO2: 23 mEq/L (ref 19–32)
Chloride: 104 mEq/L (ref 96–112)
Glucose, Bld: 87 mg/dL (ref 70–99)
Potassium: 3.3 mEq/L — ABNORMAL LOW (ref 3.5–5.1)
Sodium: 139 mEq/L (ref 135–145)

## 2011-04-24 NOTE — Discharge Summary (Signed)
[name], [name]                 ACCOUNT NO.:  [id number]  MEDICAL RECORD NO.:  [id number]           PATIENT TYPE:  I  LOCATION:  6741                         FACILITY:  [facility]  PHYSICIAN:  Jeoffrey Massed, MD    DATE OF BIRTH:  Aug 23, 1966  DATE OF ADMISSION:  04/19/2011 DATE OF DISCHARGE:                        DISCHARGE SUMMARY - REFERRING   PRIMARY CARE PRACTITIONER:  Lonzo Cloud. Kriste Basque, MD  PRIMARY GASTROENTEROLOGIST:  Petra Kuba, MD  PRIMARY DISCHARGE DIAGNOSIS:  Diverticulitis.  SECONDARY DISCHARGE DIAGNOSES:  Include the following; 1. Hypertension. 2. Hypothyroidism.  DISCHARGE MEDICATIONS: 1. Ciprofloxacin 500 mg 1 tablet p.o. twice daily for 7 more days. 2. Flagyl 500 mg p.o. every 8 hours for 7 more days. 3. Flora-Q 1 capsule p.o. daily. 4. Aspirin 81 mg 1 tablet daily. 5. Hyzaar 100/25 one tablet p.o. daily. 6. Multivitamins 1 tablet daily. 7. Synthroid 200 mcg 1 tablet p.o. daily. 8. Vitamin B12 of 1 tablet p.o. daily.  CONSULTATIONS:  Dr. Luisa Hart from [facility].  BRIEF HISTORY OF PRESENT ILLNESS:  The patient is a very pleasant 45- year-old male who presented to the ED on May 8 for abdominal pain. Further workup in the ED revealed the patient had diverticulitis.  The patient was then admitted to the hospitalist service for further evaluation and treatment.  For further details, please see the history and physical that was dictated by Dr. Mahala Menghini on admission.  PERTINENT RADIOLOGICAL STUDIES.: 1. CT of the abdomen and pelvis done Apr 19, 2011, showed sigmoid     diverticulosis with active diverticulitis. 2. Repeat CT of the abdomen on Apr 22, 2011, showed no significant     change in sigmoid colon diverticulitis with perforation and     pericolonic inflammation.  No organized abscess at this point.  PERTINENT LABORATORY DATA: 1. C. diff by PCR was negative. 2. CBC on admission showed a WBC of 16.8. 3. CBC on discharge showed WBC of  7.8.  BRIEF HOSPITAL COURSE: 1. Abdominal pain.  This was secondary to acute diverticulitis.  The     patient was admitted, kept n.p.o. and started on IV ciprofloxacin     and Flagyl.  Surgery was consulted by the admitting physician and     they agreed with conservative medical management.  The patient's     diet was slowly advanced.  His hospital stay was complicated by a     persistent pain and the fever at one point in time.  Repeat CT scan     of the abdomen was essentially unchanged per my discussion with Dr.     Rito Ehrlich, Radiology/Oncology yesterday.  Currently today the     patient claims significant symptomatic improvement and denies that     he has further pain at all.  He has been tolerating a full liquid     diet and does not want to eat in the hospital for any further and     is requesting to be discharged home.  I have instructed the patient     to stay on a soft mechanical diet for the next few weeks.  He     claims understanding.  The patient has also been instructed to     follow up with his primary gastroenterologist Dr. Ewing Schlein within 4-6     weeks for a colonoscopy.  He claims understanding and claims that     he can call and make these appointments. 2. Hypertension.  This is stable.  The patient is to resume his prior     medications on discharge. 3. Hypothyroidism.  The patient will continue Synthroid on discharge. 4. Disposition.  The patient is to be discharged home.  FOLLOWUP INSTRUCTIONS: 1. The patient to stay on a soft mechanical diet for the next few     weeks. 2. The patient needs to follow up with his primary care practitioner     Dr. Kriste Basque within 1-2 weeks upon discharge.  He is to call and make     an appointment.  He claims understanding. 3. The patient is to follow up with Dr. Ewing Schlein from [facility] who was     his primary gastroenterologist within 4-6 weeks upon discharge for     a colonoscopy.  The patient claims he can call and make      appointments.  Total time spent coordinating discharge 45 minutes.     Jeoffrey Massed, MD     SG/MEDQ  D:  04/23/2011  T:  04/23/2011  Job:  [id number]  cc:   Lonzo Cloud. Kriste Basque, MD Petra Kuba, M.D.  Electronically Signed by Jeoffrey Massed  on 04/24/2011 03:06:40 PM

## 2011-04-25 ENCOUNTER — Telehealth: Payer: Self-pay | Admitting: Pulmonary Disease

## 2011-04-25 DIAGNOSIS — K5792 Diverticulitis of intestine, part unspecified, without perforation or abscess without bleeding: Secondary | ICD-10-CM

## 2011-04-25 NOTE — Telephone Encounter (Signed)
Pt calling, states he was admitted to hospital 5/8 @ [facility]. Dx diverticulitis and d/c 5/12. States he was given rx for flagyl and cipro and pharmacy closed prior to picking up rxs. Will p/u meds on 5/13. Pt c/o sharp pain in rt lower abdomen, onset 5/12 1730, says pain @ "2" on 1-10 scale w intermittent stabbing pain. Denies vomiting. Last BM 5/12 am A febrile. Pt advised to go to ED if pain becomes severe. (This msg was sent by operator Patriciaann Clan, also sent fax to triage). Darletta Moll

## 2011-04-25 NOTE — Telephone Encounter (Signed)
Abdomen. Onset 04/23/11 1730. States pain @ "2" on 1-10 scale w intermittent stabbing pain. Denies vomiting. Last BM 5/12 am. Afebrile. Pt advised to go to ER if pain becomes severe. Fax also sent to triage.

## 2011-04-25 NOTE — Telephone Encounter (Signed)
Is fine to refer to GI for recent hospitalization for diverticulitis.

## 2011-04-25 NOTE — Telephone Encounter (Signed)
Pt aware referral was sent for GI evaluation. Nothing further was needed. Referral has been sent

## 2011-04-25 NOTE — Telephone Encounter (Signed)
Error.Paul Shannon ° ° °

## 2011-04-25 NOTE — Telephone Encounter (Signed)
Pt is calling for GI referral due to diverticulitis. Pls advise.

## 2011-04-25 NOTE — Telephone Encounter (Signed)
This is a duplicate message on this pt for the same issue.

## 2011-04-26 NOTE — Consult Note (Signed)
NAMESPENCER, CARDINAL                 ACCOUNT NO.:  [id number]  MEDICAL RECORD NO.:  [id number]           PATIENT TYPE:  O  LOCATION:  6741                         FACILITY:  [facility]  PHYSICIAN:  Emmalena Canny A. Presley Gora, M.D.DATE OF BIRTH:  [birth year]  DATE OF CONSULTATION:  04/19/2011 DATE OF DISCHARGE:                                CONSULTATION   REQUESTING PHYSICIAN:  Dr. Ethelda Chick in the emergency department.  REASON FOR CONSULTATION:  Abdominal pain diverticulitis.  HISTORY OF PRESENT ILLNESS:  Paul Shannon is a pleasant 45 year old gentleman who was awoken about 2 a.m. with severe lower midline and right lower quadrant abdominal pain.  This was associated with fever, chills and sweats.  He presented to the emergency department due to the amount of severe pain.  He describes no nausea or vomiting.  He did not describe any previous symptoms yesterday.  He denies any prior history of symptoms similar to this.  He denies any associated dysuria, hematuria.  States his bowel movements have been normal without hematochezia or melena.  His presentation to the emergency department prompted some labs and ultimately a CT scan which finds evidence of extensive sigmoid diverticulitis but no evidence of abscess or perforation.  His white blood cell count is elevated.  The hospitalist team has been asked to admit the patient for an inpatient management and we have asked to evaluate the patient for possible surgical intervention.  PAST MEDICAL HISTORY: 1. Hypertension. 2. Hypothyroidism.  PAST SURGICAL HISTORY:  Negative.  FAMILY HISTORY:  Noncontributory at the present case.  SOCIAL HISTORY:  The patient is married, lives here in town.  He denies any alcohol, tobacco or illicit drug use.  DRUG ALLERGIES:  PENICILLIN.  MEDICATIONS:  The patient states he takes blood pressure medicine and thyroid medicine but cannot remember the exact names and dosings.  REVIEW OF SYSTEMS:  Please see  history of present illness for pertinent findings, otherwise complete 10 system review found negative.  PHYSICAL EXAMINATION:  GENERAL:  A 45 year old obese gentleman who is in mild acute distress. VITAL SIGNS:  Temperature of 101.2, heart rate of 110, respiratory rate of 20, blood pressure 115/83, oxygen saturation 100% on room air. ENT:  Unremarkable. NECK:  Supple without lymphadenopathy.  Trachea is midline.  No thyromegaly or masses. LUNGS:  Clear to auscultation.  No wheezes, rhonchi or rales. Respiratory effort is normal without use of accessory muscles. HEART:  Tachycardic but regular.  No murmurs, gallops or rubs.  Carotids 2+ and brisk without bruits.  Peripheral pulses are intact and symmetrical. ABDOMEN:  Soft.  No surgical scars.  The patient is tender in the suprapubic region predominantly, is also tender in both right and left quadrant favoring the right side slightly, no mass effect is appreciated.  No hernias are appreciated.  The patient's bowel sounds are hypoactive. RECTAL:  Deferred. EXTREMITIES:  Good active range of motion in all extremities without crepitus or pain.  Normal muscle strength and tone without atrophy. SKIN:  Otherwise, warm and dry with good turgor.  No rashes, lesions, or nodules noted. NEUROLOGIC:  The patient is alert and oriented  x3.  DIAGNOSTICS:  CBC is abnormal but his white blood cell count shows a level of 16.9, hemoglobin of 17, hematocrit of 45.0, platelet count of 213.  Metabolic panel shows a sodium of 137, potassium of 3.2, chloride of 99, CO2 of 26, BUN of 11, creatinine of 0.7, glucose of 113.  Coags are normal.  Urinalysis otherwise negative.  IMAGING:  CT scan again showed diverticulosis with extensive diverticulitis type changes of the sigmoid colon which lies predominantly in the midline.  There is no focal fluid collection to suggest abscess or free air to suggest perforation at present time. Small bowel is otherwise  decompressed.  IMPRESSION:  Acute sigmoid diverticulitis without complicating features.  RECOMMENDATIONS:  Agree with current plan although would otherwise recommend the patient to be n.p.o. with the exception of medications or ice chips for at least the first 24 hours or until pain is significantly improved prior to starting liquid diet and subsequent advancement. Agree with plan for IV antibiotics for several days and subsequent transfer to p.o. antibiotics.  The patient may need repeat CT scan if conservative management does not show improvement.  We will follow along with this case in the event the patient does not improve clinically and may need more urgent surgery.     Brayton El, PA-C   ______________________________ Paul Shannon, M.D.    Corky Downs  D:  04/19/2011  T:  04/20/2011  Job:  [id number]  cc:   Pleas Koch, MD Dr. Ethelda Chick  Electronically Signed by Brayton El  on 04/26/2011 01:25:21 PM Electronically Signed by Harriette Bouillon M.D. on 04/26/2011 01:47:29 PM

## 2011-05-03 NOTE — H&P (Signed)
Paul Shannon, Paul Shannon                 ACCOUNT NO.:  [id number]  MEDICAL RECORD NO.:  [id number]           PATIENT TYPE:  E  LOCATION:  [facility]                         FACILITY:  [facility]  PHYSICIAN:  Pleas Koch, MD        DATE OF BIRTH:  Nov 18, 1966  DATE OF ADMISSION:  04/19/2011 DATE OF DISCHARGE:                             HISTORY & PHYSICAL   ADMISSION DIAGNOSIS:  Abdominal pain.  PRIMARY CARE PHYSICIAN:  Lonzo Cloud. Kriste Basque, MD  HISTORY OF PRESENT ILLNESS:  The patient is a pleasant 45 year old male complaining of abdominal pain since 2 a.m. this morning.  The patient states the abdominal pain is within lower quadrant and sort of a constant pain, which intermittently got sharper not associated with any food but was worsened by movement.  There is no relationship to this with anything else.  Allegedly the patient ate at a restaurant, had stake salad and potatoes outside [place].  He states he is not known to have had food before and suffered these issues after that. There was nothing else that he has eaten in the recent past.  He denies any current nausea and vomiting, but states he felt nauseated this morning.  On careful questioning, he states that he passed multiple stools this morning and the pain was characterized more as a gassy type pain which increased in intensity.  Character of his stools was brown and pasty, no darkness in it.  He is still passing gas.  He denies any chest pain, but does feel weak.  He has no faintness or dizziness.  He did note a fever, however and came to the emergency room because he did not feel well.  PAST MEDICAL HISTORY: 1. Blood pressure. 2. Hypothyroidism. 3. History of having fatty liver disease, biopsy proven.  PAST SURGICAL HISTORY: 1. Right knee arthroscopy. 2. He has had his tonsils and adenoids taken out.  ALLERGIES:  The patient allergic to PENICILLIN, breaks into hives.  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  He does not smoke or drink at present, although he did drink before.  He has never used legal drugs.  He also lives in [place].  FAMILY HISTORY:  Significant for premature cardiac disease with his father passing at the age of 51 after sustaining a heart attack at the age of 79.  Grandfather lived till the age of 73.  He does not know much of his mother's history.  PHYSICAL EXAMINATION:  GENERAL:  The patient is alert, oriented Caucasian male in no apparent distress. VITAL SIGNS:  His temperature on admission was 101.2, blood pressures 114-133 over 70-78, his pulse rate has been elevated as high as 120, however currently it is about 115, O2 sats is 97%, his pain scale is currently 6 on 10, it was 10/10 when he came in. HEENT:  His sclerae are clear.  He has no icterus.  No pallor. Funduscopy was performed, but I was not able to appreciate this (the patient states he has had Lasik surgery).  Dentition is good. NECK:  Soft, supple,  but thick. HEART:  S1, S2.  No murmurs, rubs, or gallops, but tachycardic. CHEST:  Clinically clear.  No fremitus or resonance. ABDOMEN:  Tender in the lower quadrant.  There is no rebound.  There is no guarding. EXTREMITIES:  The lower extremities are soft. NEUROLOGIC:  He is grossly intact.  LABORATORY DATA:  Urinalysis was negative.  BMP showed potassium 3.2, BUN and creatinine was 0.7 and 2, calcium 9.4, rest normal.  CBC showed some "interfering substance" but his WBC is 16.8, repeat is currently pending.  Hepatic function panel showed total bilirubin 1.3 slightly elevated with indirect component of 1.1.  AST and ALT elevated at 58 and 82 rest is normal.  INR is normal at 1.02.  IMAGING DATA:  Chest x-ray two-view on Apr 19, 2011, showed persistent low lung volumes with clear lungs and normal heart seen.  CT abdomen and pelvis on Apr 19, 2011, showed sigmoid diverticulosis with active diverticulitis, diffuse fatty infiltration of  liver.  IMPRESSION/ASSESSMENT: 1. Possible diverticulitis and diverticulosis in the setting of recent     questionable food intake:  This sounds like it could be food     poisoning versus an acute gastroenteritis.  The patient does not     have classical symptoms of nausea, vomiting, or diarrhea.  However,     he has empirically been started on Avelox and Flagyl.  I would, for     the sake of cost, change this to Cipro in the morning.  He does     pass stool, again we will try and get stool culture to determine     exact etiology.  It is unclear to me at this point whether he     should desist from seeds and nuts from his diet as there is no real     evidence for or accounts the same.  Hopefully, the patient will     resolve and can be transitioned to p.o. antibiotics in a day and     half or so.  The patient already states he is feeling like he could     drink something and we will give him some fluids for this.  I will     keep him on IV fluids that maybe 150 mL an hour to prevent     dehydration and the patient will be reviewed.  I will admitted him     as an outpatient, if he needs inpatient criteria and we will change     the same. 2. Hypertension.  We will hold his antihypertensives at present time     given the fact that he might develop some volume depletion.  These     will be reimplanted in the near future depending on how he does. 3. Hypothyroidism.  Continue Synthroid. 4. Fatty liver disease.  The patient has biopsy-proven fatty liver     disease.  He does have an elevated indirect bilirubin, which would     may also point to possible Crigler-Najjar syndrome.  I am unsure if     this is the case and Dr. Kriste Basque can follow up on this. 5. Vitamin B12 deficiency.  The patient will continue cyanocobalamin,     he will need CBC in the morning to determine this.  Repeat CBC is     pending. 6. Obesity.  The patient will be counseled regarding this. 7. The patient is a full code.   His wife is his healthcare power of  attorney, her name is Selena Batten.  She can be reached at [phone].  The     patient will be admitted to [facility] team 1.  It was pleasure     meeting him.          ______________________________ Pleas Koch, MD     JS/MEDQ  D:  04/19/2011  T:  04/19/2011  Job:  [id number]  cc:   Lonzo Cloud. Kriste Basque, MD  Electronically Signed by Pleas Koch MD on 05/03/2011 08:51:15 AM

## 2011-05-05 ENCOUNTER — Telehealth: Payer: Self-pay | Admitting: Pulmonary Disease

## 2011-05-05 MED ORDER — LEVOTHYROXINE SODIUM 200 MCG PO TABS: 200.0000 ug | ORAL_TABLET | Freq: Every day | ORAL | Status: DC

## 2011-05-05 MED ORDER — LOSARTAN POTASSIUM-HCTZ 100-25 MG PO TABS: 1.0000 | ORAL_TABLET | Freq: Every day | ORAL | Status: DC

## 2011-05-05 NOTE — Telephone Encounter (Signed)
Ok to add pt to SN schedule for 6-20 at 10am for cpx. thanks

## 2011-05-05 NOTE — Telephone Encounter (Signed)
Spoke with pt and notified of appt date and time and he accepted this appt. He requested refills for hyzaar and synthroid 90 day supply and meds were sent to pharm.

## 2011-05-05 NOTE — Telephone Encounter (Signed)
Pls advise thanks.  

## 2011-05-25 ENCOUNTER — Telehealth: Payer: Self-pay | Admitting: Pulmonary Disease

## 2011-05-25 MED ORDER — DICYCLOMINE HCL 20 MG PO TABS: 20.0000 mg | ORAL_TABLET | Freq: Four times a day (QID) | ORAL | Status: DC | PRN

## 2011-05-25 NOTE — Telephone Encounter (Signed)
Spoke with pt and notified of recs per SN. Pt verbalized understanding. Rx for bentyl was sent to pharm.

## 2011-05-25 NOTE — Telephone Encounter (Signed)
Per SN--yes call in bentyl 20mg    #100    Take one tablet by mouth four times daily as needed for abd cramping. And ask him to call Dr. Arlyce Dice for work in appt if not improving. thanks

## 2011-05-25 NOTE — Telephone Encounter (Signed)
LMTCB

## 2011-05-25 NOTE — Telephone Encounter (Signed)
Spoke with pt.  He states that he was dxed with diverticulitis approx 5 wks ago. Started having some cramping yesterday, and it seems to be getting worse. He is sched to see Arlyce Dice on 05/06/11 and has appt pending with SN on 06/09/11. Would like to know if SN can call something in for him in the meantime. Please advise, thanks! Allergies  Allergen Reactions  . Niacin     REACTION: pt states INTOL w/ flushing  . Penicillins     REACTION: childhood - extreme rash  . Rosuvastatin     REACTION: pt states INTOL  ]

## 2011-05-30 ENCOUNTER — Other Ambulatory Visit: Payer: Self-pay | Admitting: Pulmonary Disease

## 2011-05-30 ENCOUNTER — Other Ambulatory Visit (INDEPENDENT_AMBULATORY_CARE_PROVIDER_SITE_OTHER): Payer: BC Managed Care – PPO

## 2011-05-30 DIAGNOSIS — Z0000 Encounter for general adult medical examination without abnormal findings: Secondary | ICD-10-CM

## 2011-05-30 LAB — BASIC METABOLIC PANEL
BUN: 17 mg/dL (ref 6–23)
CO2: 28 mEq/L (ref 19–32)
Chloride: 97 mEq/L (ref 96–112)
Creatinine, Ser: 0.8 mg/dL (ref 0.4–1.5)
Glucose, Bld: 87 mg/dL (ref 70–99)

## 2011-05-30 LAB — CBC WITH DIFFERENTIAL/PLATELET
Basophils Relative: 0.4 % (ref 0.0–3.0)
Eosinophils Absolute: 0.2 10*3/uL (ref 0.0–0.7)
Eosinophils Relative: 2.8 % (ref 0.0–5.0)
Lymphocytes Relative: 35.1 % (ref 12.0–46.0)
Neutrophils Relative %: 51.7 % (ref 43.0–77.0)
Platelets: 264 10*3/uL (ref 150.0–400.0)
RBC: 4.58 Mil/uL (ref 4.22–5.81)
WBC: 6.5 10*3/uL (ref 4.5–10.5)

## 2011-05-30 LAB — LIPID PANEL
Cholesterol: 131 mg/dL (ref 0–200)
Triglycerides: 112 mg/dL (ref 0.0–149.0)

## 2011-05-30 LAB — TSH: TSH: 1.09 u[IU]/mL (ref 0.35–5.50)

## 2011-05-30 LAB — HEPATIC FUNCTION PANEL
ALT: 55 U/L — ABNORMAL HIGH (ref 0–53)
AST: 37 U/L (ref 0–37)
Total Protein: 7.9 g/dL (ref 6.0–8.3)

## 2011-06-01 ENCOUNTER — Encounter: Payer: Self-pay | Admitting: Pulmonary Disease

## 2011-06-01 ENCOUNTER — Ambulatory Visit (INDEPENDENT_AMBULATORY_CARE_PROVIDER_SITE_OTHER): Payer: BC Managed Care – PPO | Admitting: Pulmonary Disease

## 2011-06-01 DIAGNOSIS — K589 Irritable bowel syndrome without diarrhea: Secondary | ICD-10-CM

## 2011-06-01 DIAGNOSIS — K5792 Diverticulitis of intestine, part unspecified, without perforation or abscess without bleeding: Secondary | ICD-10-CM

## 2011-06-01 DIAGNOSIS — E039 Hypothyroidism, unspecified: Secondary | ICD-10-CM

## 2011-06-01 DIAGNOSIS — E7800 Pure hypercholesterolemia, unspecified: Secondary | ICD-10-CM

## 2011-06-01 DIAGNOSIS — K5732 Diverticulitis of large intestine without perforation or abscess without bleeding: Secondary | ICD-10-CM

## 2011-06-01 DIAGNOSIS — K219 Gastro-esophageal reflux disease without esophagitis: Secondary | ICD-10-CM

## 2011-06-01 DIAGNOSIS — I10 Essential (primary) hypertension: Secondary | ICD-10-CM

## 2011-06-01 DIAGNOSIS — K7689 Other specified diseases of liver: Secondary | ICD-10-CM

## 2011-06-01 DIAGNOSIS — E663 Overweight: Secondary | ICD-10-CM

## 2011-06-01 DIAGNOSIS — F411 Generalized anxiety disorder: Secondary | ICD-10-CM

## 2011-06-01 MED ORDER — LOSARTAN POTASSIUM-HCTZ 100-25 MG PO TABS: 1.0000 | ORAL_TABLET | Freq: Every day | ORAL | Status: DC

## 2011-06-01 MED ORDER — POTASSIUM CHLORIDE CRYS ER 20 MEQ PO TBCR: 20.0000 meq | EXTENDED_RELEASE_TABLET | Freq: Two times a day (BID) | ORAL | Status: DC

## 2011-06-01 MED ORDER — LEVOTHYROXINE SODIUM 200 MCG PO TABS: 200.0000 ug | ORAL_TABLET | Freq: Every day | ORAL | Status: DC

## 2011-06-01 NOTE — Patient Instructions (Signed)
Today we updated your med list in EPIC>    We reilled your Hyzaar & Synthroid, and we added KCl tab to take one twice daily on a regular basis...  We reviewed all your recent blood work, and the hospital data from May...  Keep the sched GI appt w/ DrKaplan next week...  Call for any questions...  Let's plan a follow up visit in 6 months, sooner if needed for problems.Marland KitchenMarland Kitchen

## 2011-06-01 NOTE — Progress Notes (Signed)
Subjective:    Patient ID: Paul Shannon, male    DOB: 18-Apr-1966, 45 y.o.   MRN: [id number]  HPI 45 y/o WM here for a follow up visit & CPX... he has multiple medical problems as noted below...   ~  March 02, 2009:  he informs me that he now has a new Engineering geologist thru work where he pays the first $6,000 out of pocket (w/ a health savings acct helping) & lower premiums for the rest of the plan (catastrophic coverage)... he has to pay for everything initially, meds included, except for a Physical Exam (?including CXR, EKG, labs), which is covered completely once per year... we discussed his med Rx- Levothyroid & Hyzaar- he will check w/ Pharm re: cash cost of the ARB's.  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  also has some right lat epicondylitis pain- try soaks, heat MOBIC (may need injection)... BP appears controlled on medication;  trying to control Chol w/ diet alone & intol to Cres10 last yr;  weight is about the same and desperately needs to get wt down, esp in view of his steatosis.  ~  June 01, 2011:  80mo ROV & post hosp visit> He was hospitalized 5/12 w/ acute diverticulitis, treated w/ Cipro & Flagyl & slowly improved; CT Abd revealed acute diverticulitis in prox sigmoid w/ perforation & pericolonic inflamm, and fatty liver disease; CCS consult by DrCornett w/ rec for med rx & no surg intervention; he has a post hosp follow up appt w/ DrKaplan soon...     Medically Paul Shannon has been OK> BP controlled on Losartan; note- CXR & EKG were not done during his recent hosp...    We did f/u Fasting labs and FLP looks good on diet alone now x for low HDL at 30 & advised incr exercise, get wt down; despite the hosp he hasn't lost much  wt.    Clinically euthyroid on large dose of Synthroid & TSH= 1.09    He has some DJD knees and he right knee arthroscopy 10/11 by DrWainer...   Problem List:  HYPERTENSION (ICD-401.9) - controlled on HYZAAR 100-25 daily... BP=116/76 today, & he states OK at home ~130's to 140/ 80's-90... denies HA, fatigue, visual changes, CP, palipit, dizziness, syncope, dyspnea, edema, etc...  ~  CXR 4/11 showed low lung vol's but clear, normal Cor, NAD. ~  EKG showed NSR rate 60, no signif STTWA's, WNL.  HYPERCHOLESTEROLEMIA (ICD-272.0) - he has refused meds preferring to attempt control on diet alone... he was intol to Niacin in past w/ flushing... intol to Crestor10 when tried in Sep09... ~  FLP 3/08 showed TChol 143, TG 190, HDL 30, LDL 75... rec to start fibrate- he declined meds. ~  FLP 9/09 showed TChol 143, TG 123, HDL 20, LDL 99... rec to start Crestor10mg /d- he agrees. ~  pt states that he didn't tolerate the Crestor10 either, therefore stopped it. ~  FLP 4/11 showed TChol 130, TG 94, HDL 33, LDL 79... much improved, on diet alone. ~  FLP 6/12 on diet alone (after divertic episode) showed TChol 131, TG 112, HDL 30, LDL 78... rec incr exercise.  HYPOTHYROIDISM (ICD-244.9) - on SYNTHROID 285mcg/d w/ ?  compliance issues...  ~  labs 3/08 showed TSH=20.5 on Synthroid 178mcg/d and dose increased to 200. ~  labs 9/09 showed TSH= 6.65 on 217mcg/d... continue same med. ~  ran out of meds Feb10 and didn't refill- labs 3/10 showed TSH= 93... rec> restart LEVOTHY200. ~  labs 4/11 showed TSH= 0.61... clinically euthyroid, may need to cut back dose. ~  Labs 6/12 showed TSH= 1.09  OVERWEIGHT (ICD-278.02) - since we first met Paul Shannon in 1997 his weight has been 220-230 range... occas down to 210-215, and once down to 205# in 2001... highest weight has been 232#.[name]. ~  weight 3/10 = 230# ~  weight 4/11 = 228# ~  Weight 6/12 = 221#... This was after his diverticulitis episode.  GERD (ICD-530.81) - uses OTC  Prilosec as needed.  DIVERTICULITIS>  Adm to hosp 5/12 w/ acute sigmoid diverticulitis;  treated w/ Cipro & Flagyl & slowly improved; CT Abd revealed acute diverticulitis in prox sigmoid w/ perforation & pericolonic inflamm, and fatty liver disease; CCS consult by DrCornett w/ rec for med rx & no surg intervention; he has a post hosp follow up appt w/ DrKaplan ==> pending 6/12  IRRITABLE BOWEL SYNDROME (ICD-564.1) & HEMORRHOIDS (ICD-455.6) - he had a neg FlexSig in 1998... he uses OTC PrepH.  FATTY LIVER DISEASE (ICD-571.8) -  he has hepatic steatosis w/ prev GI eval 1999 by [facility] including a liver Bx... there is a pos family hx of cirrhosis in his father who died from this disease... he has been counselled on weight reduction and told to avoid hepatotoxins including alcohol...  he has discussed Hepatic Steatosis/ Fatty Liver Disease w/ GI, and we have provided him w/ mult hand-out about this condition...  he understands that it can lead to cirrhosis and death from liver failure... he understands that he must get his weight down, control his fat intake, and follow the FLP & LFT's carefully... ~  LFTs 3/08 showed SGOT= 61, SGPT= 100... rec> get wt down. ~  LFTs 9/09 showed SGOT= 43, SGPT= 62... improved. ~  LFTs 2/10 showed SGOT= 29, SGPT= 39... normal! ~  LFTs 4/11 showed SGOT= 60, SGPT= 97... must diet & get wt down! 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  ANXIETY (ICD-300.00) - uses Alprazolam Prn...  DERM - mult tattoos...  Health Maintenance -  ~  GU:  age 31, PSA checked 2/10 due to symptoms and normal at 0.85... ~  GI:  had FlexSig in 1998 for IBS symptoms by [facility] & it was neg... GI f/u pending w/ DrKaplan s/p diverticulitis. ~  Immunizations:  ? last Tetanus shot (he agrees to TDAP today)... hasn't had Pneumovax, Flu shots (refuses), or HepB series...   Past Surgical History  Procedure Date    . Knee surgery 2011    right knee arthroscopy for meniscus tears & chondromalacia 10/11 by DrWainer  . Tonsillectomy and adenoidectomy   . Liver biopsy     Outpatient Encounter Prescriptions as of 06/01/2011  Medication Sig Dispense Refill  . aspirin 81 MG tablet Take 81 mg by mouth daily.        [name] dicyclomine (BENTYL) 20 MG tablet Take 1 tablet (20 mg total) by mouth 4 (four) times daily as needed.  100 tablet  0  . levothyroxine (SYNTHROID) 200 MCG tablet Take 1 tablet (200 mcg total) by mouth daily.  90 tablet  0  . losartan-hydrochlorothiazide (HYZAAR) 100-25  MG per tablet Take 1 tablet by mouth daily.  90 tablet  0  . Multiple Vitamin (MULTIVITAMIN) capsule Take 1 capsule by mouth daily.          Allergies  Allergen Reactions  . Niacin     REACTION: pt states INTOL w/ flushing  . Penicillins     REACTION: childhood - extreme rash  . Rosuvastatin     REACTION: pt states INTOL    Current Medications, Allergies, Past Medical History, Past Surgical History, Family History, and Social History were reviewed in [employer] record.   Review of Systems        The patient complains of joint pain and paresthesias.  The patient denies fever, chills, sweats, anorexia, fatigue, weakness, malaise, weight loss, sleep disorder, blurring, diplopia, eye irritation, eye discharge, vision loss, eye pain, photophobia, earache, ear discharge, tinnitus, decreased hearing, nasal congestion, nosebleeds, sore throat, hoarseness, chest pain, palpitations, syncope, dyspnea on exertion, orthopnea, PND, peripheral edema, cough, dyspnea at rest, excessive sputum, hemoptysis, wheezing, pleurisy, nausea, vomiting, diarrhea, constipation, change in bowel habits, abdominal pain, melena, hematochezia, jaundice, gas/bloating, indigestion/heartburn, dysphagia, odynophagia, dysuria, hematuria, urinary frequency, urinary hesitancy, nocturia, incontinence, back pain, joint swelling, muscle cramps,  muscle weakness, stiffness, arthritis, sciatica, restless legs, leg pain at night, leg pain with exertion, rash, itching, dryness, suspicious lesions, paralysis, seizures, tremors, vertigo, transient blindness, frequent falls, frequent headaches, difficulty walking, depression, anxiety, memory loss, confusion, cold intolerance, heat intolerance, polydipsia, polyphagia, polyuria, unusual weight change, abnormal bruising, bleeding, enlarged lymph nodes, urticaria, allergic rash, hay fever, and recurrent infections.     Objective:   Physical Exam      WD, Overweight, 45 y/o WM in NAD... GENERAL:  Alert & oriented; pleasant & cooperative... HEENT:  [place]/AT, EOM-wnl, PERRLA, EACs-clear, TMs-wnl, NOSE-clear, THROAT-clear & wnl... NECK:  Supple w/ full ROM; no JVD; normal carotid impulses w/o bruits; no thyromegaly or nodules palpated; no lymphadenopathy... CHEST:  Clear to P & A; without wheezes/ rales/ or rhonchi heard... HEART:  Regular Rhythm; without murmurs/ rubs/ or gallops detected... ABDOMEN:  Soft & min tender LLQ; normal bowel sounds; no organomegaly or masses palpated... RECTAL:  neg- prostate 2+ normal, stool heme neg, few ext hem's noted... EXT: without deformities, mild arthritic changes;   NEURO:  CN's intact; motor testing normal; sensory testing normal; gait normal & balance OK. DERM:  No lesions noted; no rash, several tatoos...   Assessment & Plan:   DIVERTICULITIS>  AS ABOVE- acute diverticulitis w/ perf & pericolonic inflamm, improved w/ antibiotic Rx & now pending consult from DrKaplan/ GI...  HBP>  Controlled on Hyzaar, continue same, get wt down... NOTE: Labs today w/ K=3.0 & we will supplement w/ K20Bid  CHOL>  Improved w/ sl wt reduction after the diverticulitis, advised to keep up the wt reduction efforts & loew fat diet...  HYPOTHYROID>  Stable on the Synthroid 259mcg/d dose, clinically & biochem euthyroid...  OBESITY>  Needs to continue wt reduction for the sake of  his NAFLD...  GI>  GERD/ Divertics/ IBS/ Fatty Liver>  Consult w/ DrKaplan pending...  DJD> s/p right knee arthroscopy by DrWainer 10/11.Marland KitchenMarland Kitchen

## 2011-06-06 ENCOUNTER — Ambulatory Visit (INDEPENDENT_AMBULATORY_CARE_PROVIDER_SITE_OTHER): Payer: BC Managed Care – PPO | Admitting: Gastroenterology

## 2011-06-06 ENCOUNTER — Encounter: Payer: Self-pay | Admitting: Gastroenterology

## 2011-06-06 DIAGNOSIS — K5732 Diverticulitis of large intestine without perforation or abscess without bleeding: Secondary | ICD-10-CM

## 2011-06-06 DIAGNOSIS — K5792 Diverticulitis of intestine, part unspecified, without perforation or abscess without bleeding: Secondary | ICD-10-CM

## 2011-06-06 DIAGNOSIS — K7689 Other specified diseases of liver: Secondary | ICD-10-CM

## 2011-06-06 MED ORDER — PEG-KCL-NACL-NASULF-NA ASC-C 100 G PO SOLR: 1.0000 | Freq: Once | ORAL | Status: DC

## 2011-06-06 NOTE — Progress Notes (Signed)
History of Present Illness:  Mr. Paul Shannon is a 45 year old white male with history of hepatic steatosis and diverticulitis referred at the request of Dr. Kriste Basque for GI followup. He was hospitalized in May, 2012 for  CT proven acute diverticulitis. He was treated with antibiotics and discharged after an uneventful hospitalization. Except for mild fatigue he has been feeling well. Bowel movements are formed and regular. He no longer has abdominal pain. His only complaint is mild fatigue.    Review of Systems: Pertinent positive and negative review of systems were noted in the above HPI section. All other review of systems were otherwise negative.    Current Medications, Allergies, Past Medical History, Past Surgical History, Family History and Social History were reviewed in [employer] electronic medical record  Vital signs were reviewed in today's medical record. Physical Exam: General: Well developed , well nourished, no acute distress Head: Normocephalic and atraumatic Eyes:  sclerae anicteric, EOMI Ears: Normal auditory acuity Mouth: No deformity or lesions Lungs: Clear throughout to auscultation Heart: Regular rate and rhythm; no murmurs, rubs or bruits Abdomen: Soft,  non distended. No masses, hepatosplenomegaly or hernias noted. Normal Bowel sounds; there is minimal tenderness to palpation in the right lower course in Rectal:deferred Musculoskeletal: Symmetrical with no gross deformities  Pulses:  Normal pulses noted Extremities: No clubbing, cyanosis, edema or deformities noted Neurological: Alert oriented x 4, grossly nonfocal Psychological:  Alert and cooperative. Normal mood and affect

## 2011-06-06 NOTE — Assessment & Plan Note (Signed)
Hepatic steatosis by liver biopsy in 1999. Liver enzymes are stable.

## 2011-06-06 NOTE — Assessment & Plan Note (Addendum)
Clinically resolved.  Plan colonoscopy

## 2011-06-06 NOTE — Patient Instructions (Signed)
Colonoscopy A colonoscopy is an exam to evaluate your entire colon. In this exam, your colon is cleansed. A long fiberoptic tube is inserted through your rectum and into your colon. The fiberoptic scope (endoscope) is a long bundle of enclosed and very flexible fibers. These fibers transmit light to the area examined and send images from that area to your caregiver. Discomfort is usually minimal. You may be given a drug to help you sleep (sedative) during or prior to the procedure. This exam helps to detect lumps (tumors), polyps, inflammation, and areas of bleeding. Your caregiver may also take a small piece of tissue (biopsy) that will be examined under a microscope. BEFORE THE PROCEDURE  A clear liquid diet may be required for 2 days before the exam.   Liquid injections (enemas) or laxatives may be required.   A large amount of electrolyte solution may be given to you to drink over a short period of time. This solution is used to clean out your colon.   You should be present 1 prior to your procedure or as directed by your caregiver.   Check in at the admissions desk to fill out necessary forms if not preregistered. There will be consent forms to sign prior to the procedure. If accompanied by friends or family, there is a waiting area for them while you are having your procedure.  LET YOUR CAREGIVER KNOW ABOUT:  Allergies to food or medicine.  Medicines taken, including vitamins, herbs, eyedrops, over-the-counter medicines, and creams.   Use of steroids (by mouth or creams).   Previous problems with anesthetics or numbing medicines.   History of bleeding problems or blood clots.  Previous surgery.   Other health problems, including diabetes and kidney problems.   Possibility of pregnancy, if this applies.   AFTER THE PROCEDURE  If you received a sedative and/or pain medicine, you will need to arrange for someone to drive you home.   Occasionally, there is a little blood passed  with the first bowel movement. DO NOT be concerned.  HOME CARE INSTRUCTIONS  It is not unusual to pass moderate amounts of gas and experience mild abdominal cramping following the procedure. This is due to air being used to inflate your colon during the exam. Walking or a warm pack on your belly (abdomen) may help.   You may resume all normal meals and activities after sedatives and medicines have worn off.   Only take over-the-counter or prescription medicines for pain, discomfort, or fever as directed by your caregiver. DO NOT use aspirin or blood thinners if a biopsy was taken. Consult your caregiver for medicine usage if biopsies were taken.  FINDING OUT THE RESULTS OF YOUR TEST Not all test results are available during your visit. If your test results are not back during the visit, make an appointment with your caregiver to find out the results. Do not assume everything is normal if you have not heard from your caregiver or the medical facility. It is important for you to follow up on all of your test results. SEEK IMMEDIATE MEDICAL CARE IF:  You pass large blood clots or fill a toilet with blood following the procedure. This may also occur 10 to 14 days following the procedure. This is more likely if a biopsy was taken.   You develop abdominal pain that keeps getting worse and cannot be relieved with medicine.  Document Released: 11/25/2000 Document Re-Released: 02/22/2010 [employer] Patient Information 2011 [employer]. 

## 2011-06-09 ENCOUNTER — Ambulatory Visit (AMBULATORY_SURGERY_CENTER): Payer: BC Managed Care – PPO | Admitting: Gastroenterology

## 2011-06-09 ENCOUNTER — Encounter: Payer: Self-pay | Admitting: Gastroenterology

## 2011-06-09 DIAGNOSIS — K5732 Diverticulitis of large intestine without perforation or abscess without bleeding: Secondary | ICD-10-CM

## 2011-06-09 DIAGNOSIS — K7689 Other specified diseases of liver: Secondary | ICD-10-CM

## 2011-06-09 DIAGNOSIS — K5730 Diverticulosis of large intestine without perforation or abscess without bleeding: Secondary | ICD-10-CM

## 2011-06-09 LAB — HM COLONOSCOPY

## 2011-06-09 MED ORDER — SODIUM CHLORIDE 0.9 % IV SOLN: 500.0000 mL | INTRAVENOUS | Status: DC

## 2011-06-09 NOTE — Patient Instructions (Signed)
Diverticulosis Diverticulosis is a common condition that develops when small pouches (diverticula) form in the wall of the colon. The risk of diverticulosis increases with age. It happens more often in people who eat a low-fiber diet. Most individuals with diverticulosis have no symptoms. Those individuals with symptoms usually experience belly (abdominal) pain, constipation, or loose stools (diarrhea). HOME CARE INSTRUCTIONS  Increase the amount of fiber in your diet as directed by your caregiver or dietician. This may reduce symptoms of diverticulosis.   Your caregiver may recommend taking a dietary fiber supplement.   Drink at least 6 to 8 glasses of water each day to prevent constipation.   Try not to strain when you have a bowel movement.   Your caregiver may recommend avoiding nuts and seeds to prevent complications, although this is still an uncertain benefit.   Only take over-the-counter or prescription medicines for pain, discomfort, or fever as directed by your caregiver.  FOODS HAVING HIGH FIBER CONTENT INCLUDE:  Fruits. Apple, peach, pear, tangerine, raisins, prunes.   Vegetables. Brussels sprouts, asparagus, broccoli, cabbage, carrot, cauliflower, romaine lettuce, spinach, summer squash, tomato, winter squash, zucchini.   Starchy Vegetables. Baked beans, kidney beans, lima beans, split peas, lentils, potatoes (with skin).   Grains. Whole wheat bread, brown rice, bran flake cereal, plain oatmeal, white rice, shredded wheat, bran muffins.  SEEK IMMEDIATE MEDICAL CARE IF:  You develop increasing pain or severe bloating.   You have an oral temperature above 100, not controlled by medicine.   You develop vomiting or bowel movements that are bloody or black.  Document Released: 08/25/2004 Document Re-Released: 05/18/2010 [facility] Patient Information 2011 [place], [place].

## 2011-06-10 ENCOUNTER — Telehealth: Payer: Self-pay | Admitting: *Deleted

## 2011-06-10 NOTE — Telephone Encounter (Signed)
Follow up Call- Patient questions:  Do you have a fever, pain , or abdominal swelling? no Pain Score  0 *  Have you tolerated food without any problems? yes  Have you been able to return to your normal activities? yes  Do you have any questions about your discharge instructions: Diet   no Medications  no Follow up visit  no  Do you have questions or concerns about your Care? no  Actions: * If pain score is 4 or above: No action needed, pain <4. Patient questioned what to do if diverticulitis returns. Instructed patient at first symptom, pt. Verbalizes understanding of symptoms,to call Dr. Marzetta Board office. Patient verbalized understanding.

## 2011-06-11 ENCOUNTER — Encounter: Payer: Self-pay | Admitting: Pulmonary Disease

## 2011-08-02 ENCOUNTER — Telehealth: Payer: Self-pay | Admitting: Pulmonary Disease

## 2011-08-02 MED ORDER — AZITHROMYCIN 250 MG PO TABS: ORAL_TABLET | ORAL | Status: AC

## 2011-08-02 NOTE — Telephone Encounter (Signed)
Per SN----ok for pt to have zpak #1  Take as directed with no refill.  thanks

## 2011-08-02 NOTE — Telephone Encounter (Signed)
Pt aware z-pak sent to the pharmacy

## 2011-08-02 NOTE — Telephone Encounter (Signed)
Spoke with pt. He is c/o sinus pressure/HA and PND x 2 days. States no fever, cough, or other c/o's. He is requesting a zpack be called in. Please advise thanks! Allergies  Allergen Reactions  . Niacin     REACTION: pt states INTOL w/ flushing  . Penicillins     REACTION: childhood - extreme rash  . Rosuvastatin     REACTION: pt states INTOL

## 2011-08-16 ENCOUNTER — Other Ambulatory Visit: Payer: Self-pay | Admitting: Pulmonary Disease

## 2011-08-17 ENCOUNTER — Telehealth: Payer: Self-pay | Admitting: Pulmonary Disease

## 2011-08-17 ENCOUNTER — Telehealth: Payer: Self-pay | Admitting: Gastroenterology

## 2011-08-17 NOTE — Telephone Encounter (Signed)
Pt scheduled to see Willette Cluster NP 08/18/11@2pm . Pt aware of appt date and time.

## 2011-08-17 NOTE — Telephone Encounter (Signed)
Spoke with the pt and he was c/o increased itching, burning, and bleeding from his hemorrhoids. Pt has seen GI for this in June so I advised him to call them and transferred him to Dr. Arlyce Dice office.Carron Curie, CMA

## 2011-08-18 ENCOUNTER — Encounter: Payer: Self-pay | Admitting: Nurse Practitioner

## 2011-08-18 ENCOUNTER — Ambulatory Visit (INDEPENDENT_AMBULATORY_CARE_PROVIDER_SITE_OTHER): Payer: BC Managed Care – PPO | Admitting: Nurse Practitioner

## 2011-08-18 ENCOUNTER — Telehealth: Payer: Self-pay | Admitting: Pulmonary Disease

## 2011-08-18 VITALS — BP 134/72 | HR 88 | Ht 71.0 in | Wt 228.0 lb

## 2011-08-18 DIAGNOSIS — K648 Other hemorrhoids: Secondary | ICD-10-CM

## 2011-08-18 MED ORDER — HYDROCOD POLST-CHLORPHEN POLST 10-8 MG/5ML PO LQCR: 5.0000 mL | Freq: Two times a day (BID) | ORAL | Status: DC

## 2011-08-18 MED ORDER — LIDOCAINE HCL 2 % EX GEL: CUTANEOUS | Status: DC

## 2011-08-18 MED ORDER — HYDROCORTISONE ACETATE 25 MG RE SUPP: 25.0000 mg | Freq: Two times a day (BID) | RECTAL | Status: DC

## 2011-08-18 MED ORDER — MOXIFLOXACIN HCL 400 MG PO TABS: 400.0000 mg | ORAL_TABLET | Freq: Every day | ORAL | Status: AC

## 2011-08-18 NOTE — Telephone Encounter (Signed)
Per SN----pt can use otc  Allegra 180 once daily, nasal saline spray  Every 1-2 hours while awake, mucinex 2 po bid with plenty of fluids and we can call in avelox 400mg    1 daily x 7 days and tussionex #4oz   1 tsp every 12 hours prn for cough.  thanks

## 2011-08-18 NOTE — Telephone Encounter (Signed)
Pt aware.

## 2011-08-18 NOTE — Patient Instructions (Addendum)
      Anal Fissure An anal fissure often begins with sharp pain. This is usually following a bowel movement. It often causes bright red blood stained stools. It is the most common cause of rectal bleeding. One common cause of this is passage of a large, hard stool. It can also be caused by having frequent diarrheal stools. Anal fissures that occur for a longtime (chronic) may require surgery. CAUSES  Passing large, hard stools.   Frequent diarrheal stools.   Constipation.  SYMPTOMS  Bright red, blood stained stools.   Rectal bleeding.  HOME CARE INSTRUCTIONS  If constipation is the cause of the rectal fissure, it may be necessary to add a bulk-forming laxative. A diet high in fruits, whole grains, and vegetables will also help.   Taking hot sitz baths for 1 half hour 4 times per day may help.   Increase your fluid intake.   Only take over-the-counter or prescription medicines for pain, discomfort, or fever as directed by your caregiver. Do not take aspirin as this may increase the tendency for bleeding.   Do not use ointments containing anesthetic medications or hydrocortisone. They could slow healing.   Avoid constipating foods such as bananas and cheese.   In children, brown Karo syrup may be used by adding 1 teaspoon of syrup to 8 ounces of formula. An alternative is to give 3 teaspoons of mineral oil every day.  SEEK MEDICAL CARE IF: Rectal bleeding continues, changes in intensity, or becomes more severe. MAKE SURE YOU:   Understand these instructions.   Will watch your condition.   Will get help right away if you are not doing well or get worse.  Document Released: 11/28/2005 Document Re-Released: 02/22/2010 [facility] Patient Information 2011 [place], [place].    Do Sitz baths several times a day. Use Miralax once daily, 17 grams in a glass of water daily. We are sending a prescription for Xylocaine 2 % jelly, 30 ml tube  To [facility] [address],  [place].

## 2011-08-18 NOTE — Telephone Encounter (Signed)
Left a detailed msg regarding instructions per SN. Prescriptions called to his pharmacy. Will hold onto msg and call the pt back in the morning to make sure he received the msg.

## 2011-08-19 ENCOUNTER — Encounter: Payer: Self-pay | Admitting: Nurse Practitioner

## 2011-08-19 DIAGNOSIS — K648 Other hemorrhoids: Secondary | ICD-10-CM | POA: Insufficient documentation

## 2011-08-19 NOTE — Assessment & Plan Note (Signed)
Rectal pain and bleeding. Nothing on the external examination or anoscopy to account for the pain. Anoscopic examination was uncomfortable for the patient but not extremely painful so he may or may not have a fissure. There were internal hemorrhoids to explain the bleeding however. Need to treat constipation. Begin steroid suppositories for internal hemorrhoids. Xylocaine for local pain control . Patient will call us in a few days with condition update. If pain has not improved will try Diltiazem Gel for presumed fissure.

## 2011-08-19 NOTE — Progress Notes (Signed)
Paul Shannon [id number] 08-Sep-1966   HISTORY OR PRESENT ILLNESS : Patient is a 45 year old white male known to Dr. Judie Petit. for history of hepatic steatosis and diverticulitis. Patient was last seen at time of his colonoscopy in June of this year. Patient presents today with complaints of hemorrhoidal pain and bleeding. He does have frequent episodes of constipation.  Current Medications, Allergies, Past Medical History, Past Surgical History, Family History and Social History were reviewed in [employer] record.  PHYSICAL EXAMINATION : General: Well developed  male in no acute distress Head: Normocephalic and atraumatic Eyes:  sclerae anicteric,conjunctive pink. Lungs: Clear throughout to auscultation Heart: Regular rate and rhythm; no murmurs heard Abdomen: Soft, nondistended, nontender. No masses or hepatomegaly noted. Normal bowel sounds Rectal: No external lesions or fissures seen. Painful area just inside anal canal at 6 o'clock. Internal hemorrhoids seen on endoscopy. Neurological: Alert oriented x 4, grossly nonfocal Psychological:  Alert and cooperative. Normal mood and affect  ASSESSMENT AND PLAN :

## 2011-08-22 NOTE — Progress Notes (Signed)
Agree with Ms. Guenther's assessment and plan. Dorette Hartel E. Jaiona Simien, MD, FACG   

## 2011-11-21 ENCOUNTER — Telehealth: Payer: Self-pay | Admitting: Pulmonary Disease

## 2011-11-21 MED ORDER — AZITHROMYCIN 250 MG PO TABS: ORAL_TABLET | ORAL | Status: AC

## 2011-11-21 MED ORDER — PREDNISONE (PAK) 5 MG PO TABS: ORAL_TABLET | ORAL | Status: DC

## 2011-11-21 NOTE — Telephone Encounter (Signed)
I spoke with pt and he c/o sinus pressure, sinus headache, nasal congestion, sore throat, cough w/ clear phlem, drainage down back of throat x 3 days. Pt has tried otc mucinex and many other otc sinus medications but has had no relief. Pt denies any fever, chills, nausea, vomiting. Pt is requesting a zpak and cough syrup be called in for him. Please advise Dr. Kriste Basque, thanks  Allergies  Allergen Reactions  . Niacin     REACTION: pt states INTOL w/ flushing  . Penicillins     REACTION: childhood - extreme rash  . Rosuvastatin     REACTION: pt states INTOL

## 2011-11-21 NOTE — Telephone Encounter (Signed)
Per SN---ok for zpak #1  Take as directed, pred dosepak  5mg    6 day pack, mucinex 2 po bid with plenty of fluids.  thanks

## 2011-11-21 NOTE — Telephone Encounter (Signed)
Rx sent. Pt aware of recs.Carron Curie, CMA

## 2011-11-30 ENCOUNTER — Ambulatory Visit: Payer: BC Managed Care – PPO | Admitting: Pulmonary Disease

## 2011-12-13 ENCOUNTER — Other Ambulatory Visit: Payer: Self-pay | Admitting: Pulmonary Disease

## 2012-02-13 ENCOUNTER — Encounter: Payer: Self-pay | Admitting: Gastroenterology

## 2012-02-13 ENCOUNTER — Other Ambulatory Visit (INDEPENDENT_AMBULATORY_CARE_PROVIDER_SITE_OTHER): Payer: BC Managed Care – PPO

## 2012-02-13 ENCOUNTER — Ambulatory Visit (INDEPENDENT_AMBULATORY_CARE_PROVIDER_SITE_OTHER): Payer: BC Managed Care – PPO | Admitting: Gastroenterology

## 2012-02-13 ENCOUNTER — Telehealth: Payer: Self-pay | Admitting: Gastroenterology

## 2012-02-13 DIAGNOSIS — R1013 Epigastric pain: Secondary | ICD-10-CM | POA: Insufficient documentation

## 2012-02-13 DIAGNOSIS — R109 Unspecified abdominal pain: Secondary | ICD-10-CM

## 2012-02-13 LAB — COMPREHENSIVE METABOLIC PANEL
ALT: 112 U/L — ABNORMAL HIGH (ref 0–53)
AST: 75 U/L — ABNORMAL HIGH (ref 0–37)
Albumin: 4.2 g/dL (ref 3.5–5.2)
Alkaline Phosphatase: 59 U/L (ref 39–117)
Glucose, Bld: 85 mg/dL (ref 70–99)
Potassium: 3.7 mEq/L (ref 3.5–5.1)
Sodium: 139 mEq/L (ref 135–145)
Total Protein: 7.5 g/dL (ref 6.0–8.3)

## 2012-02-13 LAB — CBC WITH DIFFERENTIAL/PLATELET
Basophils Absolute: 0 10*3/uL (ref 0.0–0.1)
Eosinophils Relative: 0.9 % (ref 0.0–5.0)
HCT: 45.6 % (ref 39.0–52.0)
Lymphocytes Relative: 25.4 % (ref 12.0–46.0)
Lymphs Abs: 2.3 10*3/uL (ref 0.7–4.0)
Monocytes Relative: 8.7 % (ref 3.0–12.0)
Platelets: 253 10*3/uL (ref 150.0–400.0)
WBC: 9.2 10*3/uL (ref 4.5–10.5)

## 2012-02-13 NOTE — Assessment & Plan Note (Addendum)
Abdominal pain could be do to a acalculus cholecystitis. Pancreatitis and active peptic ulcer disease are other considerations. I think it is unlikely that he has acute diverticulitis.  Recommendations #1 CBC, amylase and comprehensive metabolic profile #2 stat HIDA scan #3 to consider upper endoscopy if numbers one and 2 are not diagnostic

## 2012-02-13 NOTE — Telephone Encounter (Signed)
Pt states he woke up at 1:30am this morning with lots of abdominal pain and bloating. States he has had no fever, he does not think it is diverticulitis. Pt is requesting to be seen. Pt scheduled to see Dr. Arlyce Dice today at 10:45am. Pt aware of appt date and time.

## 2012-02-13 NOTE — Progress Notes (Signed)
History of Present Illness:  Paul Shannon has returned for evaluation of abdominal pain. Several hours ago he awakened with severe upper and midepigastric pain. Pain radiated slightly to the back and to the sides. Pain has relented although it is still present. He's never had similar symptoms. He denies nausea or vomiting. CT scan in May, 2012 was negative for gallstones.    Review of Systems: Pertinent positive and negative review of systems were noted in the above HPI section. All other review of systems were otherwise negative.    Current Medications, Allergies, Past Medical History, Past Surgical History, Family History and Social History were reviewed in [employer] electronic medical record  Vital signs were reviewed in today's medical record. Physical Exam: General: Well developed , well nourished, no acute distress Head: Normocephalic and atraumatic Eyes:  sclerae anicteric, EOMI Ears: Normal auditory acuity Mouth: No deformity or lesions Lungs: Clear throughout to auscultation Heart: Regular rate and rhythm; no murmurs, rubs or bruits Abdomen: Soft,  and non distended. No masses, hepatosplenomegaly or hernias noted. Normal Bowel sounds; he has mild tenderness in the mid epigastrium and subxiphoid areas Rectal:deferred Musculoskeletal: Symmetrical with no gross deformities  Pulses:  Normal pulses noted Extremities: No clubbing, cyanosis, edema or deformities noted Neurological: Alert oriented x 4, grossly nonfocal Psychological:  Alert and cooperative. Normal mood and affect

## 2012-02-13 NOTE — Patient Instructions (Signed)
Your HIDA scan is scheduled at [facility] in Radiology on the first floor  Nothing to eat or drink after midnight You will go to the basement today for labs

## 2012-02-14 ENCOUNTER — Encounter (HOSPITAL_COMMUNITY)
Admission: RE | Admit: 2012-02-14 | Discharge: 2012-02-14 | Disposition: A | Discharge status: Home / Self Care | Payer: BC Managed Care – PPO | Source: Ambulatory Visit | Attending: Gastroenterology | Admitting: Gastroenterology

## 2012-02-14 DIAGNOSIS — R109 Unspecified abdominal pain: Secondary | ICD-10-CM | POA: Insufficient documentation

## 2012-02-14 MED ORDER — SINCALIDE 5 MCG IJ SOLR
INTRAMUSCULAR | Status: AC
  Administered 2012-02-14: 5.23 ug via INTRAVENOUS
  Filled 2012-02-14: qty 10

## 2012-02-14 MED ORDER — TECHNETIUM TC 99M MEBROFENIN IV KIT
5.0000 | PACK | Freq: Once | INTRAVENOUS | Status: AC | PRN
  Administered 2012-02-14: 5 via INTRAVENOUS

## 2012-02-16 ENCOUNTER — Telehealth: Payer: Self-pay

## 2012-02-16 NOTE — Telephone Encounter (Signed)
Pt scheduled for previsit 02/23/12@8 :30am, EGD scheduled with Dr. Arlyce Dice 02/27/12@11am . Pt aware of appt dates and times.

## 2012-02-16 NOTE — Telephone Encounter (Signed)
Message copied by Chrystie Nose on Thu Feb 16, 2012  4:58 PM ------      Message from: Melvia Heaps D      Created: Thu Feb 16, 2012  8:25 AM       Let's schedule Paul Shannon for EGD

## 2012-02-23 ENCOUNTER — Ambulatory Visit (AMBULATORY_SURGERY_CENTER): Payer: BC Managed Care – PPO | Admitting: *Deleted

## 2012-02-23 VITALS — Ht 70.0 in | Wt 232.1 lb

## 2012-02-23 DIAGNOSIS — R109 Unspecified abdominal pain: Secondary | ICD-10-CM

## 2012-02-23 DIAGNOSIS — R1013 Epigastric pain: Secondary | ICD-10-CM

## 2012-02-23 NOTE — Progress Notes (Signed)
No allergy to egg or soy products  

## 2012-02-27 ENCOUNTER — Encounter: Payer: Self-pay | Admitting: Gastroenterology

## 2012-02-27 ENCOUNTER — Ambulatory Visit (AMBULATORY_SURGERY_CENTER): Payer: BC Managed Care – PPO | Admitting: Gastroenterology

## 2012-02-27 VITALS — BP 136/87 | HR 65 | Temp 98.7°F | Resp 9 | Ht 70.0 in | Wt 232.0 lb

## 2012-02-27 DIAGNOSIS — R109 Unspecified abdominal pain: Secondary | ICD-10-CM

## 2012-02-27 DIAGNOSIS — D130 Benign neoplasm of esophagus: Secondary | ICD-10-CM

## 2012-02-27 DIAGNOSIS — R1013 Epigastric pain: Secondary | ICD-10-CM

## 2012-02-27 DIAGNOSIS — K229 Disease of esophagus, unspecified: Secondary | ICD-10-CM

## 2012-02-27 MED ORDER — SODIUM CHLORIDE 0.9 % IV SOLN: 500.0000 mL | INTRAVENOUS | Status: DC

## 2012-02-27 NOTE — Patient Instructions (Signed)

## 2012-02-27 NOTE — Op Note (Signed)
[facility] [address] [place], [place]  [address]  ENDOSCOPY PROCEDURE REPORT  PATIENT:  Paul Shannon, Paul Shannon  MR#:  [id number] BIRTHDATE:  08-26-1966, 46 yrs. old  GENDER:  male  ENDOSCOPIST:  Barbette Hair. Arlyce Dice, MD Referred by:  PROCEDURE DATE:  02/27/2012 PROCEDURE:  EGD with biopsy, 29562 ASA CLASS:  Class II INDICATIONS:  abdominal pain  MEDICATIONS:   MAC sedation, administered by CRNA propofol 220mg IV, glycopyrrolate (Robinal) 0.2 mg IV, 0.6cc simethancone 0.6 cc PO TOPICAL ANESTHETIC:  DESCRIPTION OF PROCEDURE:   After the risks and benefits of the procedure were explained, informed consent was obtained.  The LB GIF-H180 [id number] endoscope was introduced through the mouth and advanced to the third portion of the duodenum.  The instrument was slowly withdrawn as the mucosa was fully examined. <<PROCEDUREIMAGES>>  A nodule was found in the distal esophagus. 2-49mm area of slightly raised mucosa at 35cm from incisors. Bxs taken (see image5). Otherwise the examination was normal (see image1, image2, and image6).    Retroflexed views revealed no abnormalities.    The scope was then withdrawn from the patient and the procedure completed.  COMPLICATIONS:  None  ENDOSCOPIC IMPRESSION: 1) Nodule in the distal esophagus 2) Otherwise normal examination RECOMMENDATIONS:Surgical consultation for consideration of cholecystectomy  ______________________________ Barbette Hair. Arlyce Dice, MD  CC:  [name], MD, Ovidio Kin, MD  n. eSIGNED:   Barbette Hair. Donyelle Enyeart at 02/27/2012 11:31 AM  Chalmers Cater, [phone]

## 2012-02-27 NOTE — Progress Notes (Signed)
Patient did not experience any of the following events: a burn prior to discharge; a fall within the facility; wrong site/side/patient/procedure/implant event; or a hospital transfer or hospital admission upon discharge from the facility. (G8907) Patient did not have preoperative order for IV antibiotic SSI prophylaxis. (G8918)  

## 2012-02-28 ENCOUNTER — Other Ambulatory Visit: Payer: Self-pay | Admitting: Gastroenterology

## 2012-02-28 ENCOUNTER — Telehealth: Payer: Self-pay | Admitting: *Deleted

## 2012-02-28 ENCOUNTER — Telehealth: Payer: Self-pay

## 2012-02-28 NOTE — Telephone Encounter (Signed)
Pt scheduled to see Dr. Ovidio Kin 04/19/12@8 :45am. Pt aware of appt date and time.

## 2012-02-28 NOTE — Telephone Encounter (Signed)
  Follow up Call-  Call back number 02/27/2012 06/09/2011  Post procedure Call Back phone  # [phone]  Permission to leave phone message Yes -     Patient questions:  Do you have a fever, pain , or abdominal swelling? no Pain Score  0 *  Have you tolerated food without any problems? yes  Have you been able to return to your normal activities? yes  Do you have any questions about your discharge instructions: Diet   no Medications  no Follow up visit  no  Do you have questions or concerns about your Care? no  Actions: * If pain score is 4 or above: No action needed, pain <4.  Patient c/o sore in mouth, on bottom lip. States that he talked to the doctor about it post procedure yesterday. Told him that it sounds like the bite block may have pinched it yesterday. Patient says he is gargling with mouthwash to keep it clean. Advised patient to call us if it gets worse, that Dr. Arlyce Dice may want to see him in office about it. He said he would call us if needed.

## 2012-03-05 ENCOUNTER — Encounter: Payer: Self-pay | Admitting: Gastroenterology

## 2012-03-12 ENCOUNTER — Telehealth: Payer: Self-pay | Admitting: Gastroenterology

## 2012-03-12 ENCOUNTER — Telehealth (INDEPENDENT_AMBULATORY_CARE_PROVIDER_SITE_OTHER): Payer: Self-pay

## 2012-03-12 NOTE — Telephone Encounter (Signed)
Spoke with Arline Asp at [facility] and she will have patient moved up on Dr. Allene Pyo schedule. They will call patient with appointment change. Spoke with patient and he is aware. He will also take the Bentyl QID as recommended.

## 2012-03-12 NOTE — Telephone Encounter (Signed)
It is possible that this is gallbladder pain. Should take Bentyl 4 times a day as needed. I would probably move up his appointment with his surgeon.

## 2012-03-12 NOTE — Telephone Encounter (Signed)
On Saturday at 4 AM, woke up with vomiting and diarrhea. He thought it was a stomach bug. He continued with this on Sunday. By Sunday night, only had diarrhea. Last night at 2:30-6:00 AM, he had right upper abdominal pain like he previously had. He is still having diarrhea. He is concerned that this is his gall bladder again. He is suppose to go on a cruise next Wednesday. He is scheduled to see a Careers adviser in May. Please, advise.

## 2012-03-12 NOTE — Telephone Encounter (Signed)
Dr Arlyce Dice is requesting pt be seen sooner than may due to increasing symptoms. Please call pt to move up appt date.

## 2012-03-13 ENCOUNTER — Telehealth (INDEPENDENT_AMBULATORY_CARE_PROVIDER_SITE_OTHER): Payer: Self-pay | Admitting: General Surgery

## 2012-03-13 NOTE — Telephone Encounter (Signed)
Called pt back today to talk to him about getting him movie up to see a different Doctor, but pt stated that he was going on a Cruise next week and was not going to miss it, he asked what he could do when he is on the cruise. I told him about the grease food and the spice food to stay away from them when he is out at sea and to drink plenty of water. And I told him once he come back and he had pain when he was the cruise that he could call back up here and we could get him in the see on of the Doctor here, as of now he is staying on Dr Ezzard Standing schedule for 04-19-12 and pt was ok with all I told him

## 2012-03-30 ENCOUNTER — Other Ambulatory Visit: Payer: Self-pay | Admitting: Pulmonary Disease

## 2012-04-02 ENCOUNTER — Telehealth: Payer: Self-pay | Admitting: Pulmonary Disease

## 2012-04-02 NOTE — Telephone Encounter (Signed)
rx was authorized via surescripts in epic for #90 with no refills to [facility] on [address] in [place].    Called [facility] and verified this.  Called spoke with patient and advised that his rx was sent on 4.19.13 and that per [facility] it is ready to be picked up.  Pt verbalized his understanding and will keep the 6.7.13 appt with SN.  Pt to call is anything further is needed.

## 2012-04-19 ENCOUNTER — Other Ambulatory Visit (INDEPENDENT_AMBULATORY_CARE_PROVIDER_SITE_OTHER): Payer: Self-pay | Admitting: Surgery

## 2012-04-19 ENCOUNTER — Encounter (INDEPENDENT_AMBULATORY_CARE_PROVIDER_SITE_OTHER): Payer: Self-pay | Admitting: Surgery

## 2012-04-19 ENCOUNTER — Ambulatory Visit (INDEPENDENT_AMBULATORY_CARE_PROVIDER_SITE_OTHER): Payer: BC Managed Care – PPO | Admitting: Surgery

## 2012-04-19 VITALS — BP 132/94 | HR 76 | Temp 98.0°F | Resp 16 | Ht 70.0 in | Wt 231.6 lb

## 2012-04-19 DIAGNOSIS — R1013 Epigastric pain: Secondary | ICD-10-CM

## 2012-04-19 DIAGNOSIS — K829 Disease of gallbladder, unspecified: Secondary | ICD-10-CM | POA: Insufficient documentation

## 2012-04-19 NOTE — Progress Notes (Signed)
Re:   Paul Shannon DOB:   05/22/1966 MRN:   [id number]  ASSESSMENT AND PLAN: 1.  Biliary dyskinesia.  I discussed with the patient the indications and risks of gall bladder surgery.  The primary risks of gall bladder surgery include, but are not limited to, bleeding, infection, common bile duct injury, and open surgery.  There is also the risk that the patient may have continued symptoms after surgery.  However, the likelihood of improvement in symptoms and return to the patient's normal status is good. We discussed the typical post-operative recovery course. I tried to answer the patient's questions.  I gave the patient literature about gall bladder surgery.  2.  Esophageal nodule - benign squamous papilloma. 3.  History of diverticulitis. 4.  Hypertension 5.  Seasonal allergies 6.  History of kidney stones. 7.  Questionable right inguinal hernia. 8.  On thyroid replacement.  Chief Complaint  Patient presents with  . Abdominal Pain    new pt- eval GB    REFERRING PHYSICIAN:  Dr. Darrel Hoover  HISTORY OF PRESENT ILLNESS: Paul Shannon is a 46 y.o. (DOB: Oct 22, 1966)  white male whose primary care physician is [name],[name], MD, MD and comes to me today for gall bladder disease.  About one year ago the patient had a history of diverticulitis. This resolved with outpatient antibiotics.  In around February 2013 he developed epigastric abdominal pain. He had vomiting and loose stools. As the symptoms improved he had mild discomfort about every 2 or 3 weeks in the same location.  He's had no fever. His appetite has been good. He has no family history of gallbladder disease.  No history of stomach disease.  No history of liver disease.  No history of gall bladder disease.  No history of pancreas disease.  He has had diverticulitis about one year ago.  HIDA - EF 2% - 02/16/2012 EGD - nodule distal esophagus - 02/27/2012 CT scan - diverticulitis - 04/22/2011    Past Medical History    Diagnosis Date  . Hypertension   . Pure hypercholesterolemia   . Unspecified hypothyroidism   . Overweight   . Esophageal reflux   . Irritable bowel syndrome   . Unspecified hemorrhoids without mention of complication   . Anxiety state, unspecified   . Fatty liver     Dr Kinnie Scales   . Diverticulitis   . Arthritis   . History of kidney stones   . Chronic kidney disease     hx of kidney stones      Past Surgical History  Procedure Date  . Knee surgery 2011    right knee   . Tonsillectomy and adenoidectomy   . Liver biopsy   . Colonoscopy       Current Outpatient Prescriptions  Medication Sig Dispense Refill  . aspirin 81 MG tablet Take 81 mg by mouth daily.        . Cyanocobalamin (VITAMIN B 12 PO) Take 1 tablet by mouth daily.      [name] dicyclomine (BENTYL) 20 MG tablet Take 1 tablet (20 mg total) by mouth 4 (four) times daily as needed.  100 tablet  0  . levothyroxine (SYNTHROID) 200 MCG tablet Take 1 tablet (200 mcg total) by mouth daily.  90 tablet  3  . levothyroxine (SYNTHROID, LEVOTHROID) 200 MCG tablet TAKE 1 TABLET BY MOUTH EVERY DAY  90 tablet  0  . losartan-hydrochlorothiazide (HYZAAR) 100-25 MG per tablet Take 1 tablet by mouth daily.  90 tablet  3  . Multiple Vitamin (MULTIVITAMIN) capsule Take 1 capsule by mouth daily.        . Probiotic Product (PROBIOTIC PO) Take by mouth daily.          Allergies  Allergen Reactions  . Niacin     REACTION: pt states INTOL w/ flushing  . Penicillins     REACTION: childhood - extreme rash  . Rosuvastatin     REACTION: pt states INTOL    REVIEW OF SYSTEMS: Skin:  No history of rash.  No history of abnormal moles. Infection:  No history of hepatitis or HIV.  No history of MRSA. Neurologic:  No history of stroke.  No history of seizure.  No history of headaches. Cardiac:  Has history of hypertension.  No history of seeing a cardiologist. Pulmonary:  Does not smoke cigarettes.  No asthma or bronchitis.  No  OSA/CPAP.  Endocrine:  No diabetes. On thyroid replacement x 10 years. Gastrointestinal:  See HPI. Urologic:  History of kidney stones.  Last attack 5 years ago. Musculoskeletal:  No history of joint or back disease. Hematologic:  No bleeding disorder.  No history of anemia.  Not anticoagulated. Psycho-social:  The patient is oriented.   The patient has no obvious psychologic or social impairment to understanding our conversation and plan.  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 Raised by grandparents.  PHYSICAL EXAM: BP 132/94  Pulse 76  Temp(Src) 98 F (36.7 C) (Temporal)  Resp 16  Ht 5\' 10"  (1.778 m)  Wt 231 lb 9.6 oz (105.053 kg)  BMI 33.23 kg/m2  General: WN WM who is alert and generally healthy appearing.  HEENT: Normal. Pupils equal. Good dentition. Neck: Supple. No mass.  No thyroid mass. Lymph Nodes:  No supraclavicular or cervical nodes. Lungs: Clear to auscultation and symmetric breath sounds.  Has a little bit of a cough today. Heart:  RRR. No murmur or rub.  Abdomen: Soft. No mass. No tenderness. No hernia. Normal bowel sounds.  No abdominal scars.  Questionable right inguinal hernia Rectal: Not done. Extremities:  Good strength and ROM  in upper and lower extremities. Neurologic:  Grossly intact to motor and sensory function. Psychiatric: Has normal mood and affect. Behavior is normal.   DATA REVIEWED: Dr Marzetta Board notes and x-rays.  Ovidio Kin, MD,  [facility] [address] [place]  [address]   [place], [place]    [address] Phone:  [phone] FAX:  [phone]

## 2012-05-01 ENCOUNTER — Encounter (HOSPITAL_COMMUNITY): Payer: Self-pay | Admitting: Pharmacy Technician

## 2012-05-02 ENCOUNTER — Encounter (HOSPITAL_COMMUNITY)
Admission: RE | Admit: 2012-05-02 | Discharge: 2012-05-02 | Disposition: A | Discharge status: Home / Self Care | Payer: BC Managed Care – PPO | Source: Ambulatory Visit | Attending: Surgery | Admitting: Surgery

## 2012-05-02 ENCOUNTER — Encounter (HOSPITAL_COMMUNITY): Payer: Self-pay

## 2012-05-02 ENCOUNTER — Ambulatory Visit (HOSPITAL_COMMUNITY)
Admission: RE | Admit: 2012-05-02 | Discharge: 2012-05-02 | Disposition: A | Discharge status: Home / Self Care | Payer: BC Managed Care – PPO | Source: Ambulatory Visit | Attending: Surgery | Admitting: Surgery

## 2012-05-02 DIAGNOSIS — Z01812 Encounter for preprocedural laboratory examination: Secondary | ICD-10-CM | POA: Insufficient documentation

## 2012-05-02 DIAGNOSIS — Z01810 Encounter for preprocedural cardiovascular examination: Secondary | ICD-10-CM | POA: Insufficient documentation

## 2012-05-02 DIAGNOSIS — K828 Other specified diseases of gallbladder: Secondary | ICD-10-CM | POA: Insufficient documentation

## 2012-05-02 DIAGNOSIS — Z01818 Encounter for other preprocedural examination: Secondary | ICD-10-CM | POA: Insufficient documentation

## 2012-05-02 HISTORY — DX: Other specified diseases of gallbladder: K82.8

## 2012-05-02 LAB — DIFFERENTIAL
Basophils Absolute: 0 K/uL (ref 0.0–0.1)
Basophils Relative: 0 % (ref 0–1)
Eosinophils Absolute: 0.3 K/uL (ref 0.0–0.7)
Eosinophils Relative: 4 % (ref 0–5)
Lymphocytes Relative: 39 % (ref 12–46)
Lymphs Abs: 3 K/uL (ref 0.7–4.0)
Monocytes Absolute: 0.7 K/uL (ref 0.1–1.0)
Monocytes Relative: 10 % (ref 3–12)
Neutro Abs: 3.6 K/uL (ref 1.7–7.7)
Neutrophils Relative %: 47 % (ref 43–77)

## 2012-05-02 LAB — CBC
Hemoglobin: 15.8 g/dL (ref 13.0–17.0)
MCH: 33.1 pg (ref 26.0–34.0)
MCHC: 36.3 g/dL — ABNORMAL HIGH (ref 30.0–36.0)
Platelets: 259 10*3/uL (ref 150–400)
RBC: 4.78 MIL/uL (ref 4.22–5.81)

## 2012-05-02 LAB — COMPREHENSIVE METABOLIC PANEL
ALT: 164 U/L — ABNORMAL HIGH (ref 0–53)
AST: 121 U/L — ABNORMAL HIGH (ref 0–37)
Alkaline Phosphatase: 83 U/L (ref 39–117)
Calcium: 9.3 mg/dL (ref 8.4–10.5)
Glucose, Bld: 116 mg/dL — ABNORMAL HIGH (ref 70–99)
Potassium: 3.1 mEq/L — ABNORMAL LOW (ref 3.5–5.1)
Sodium: 139 mEq/L (ref 135–145)
Total Protein: 7.7 g/dL (ref 6.0–8.3)

## 2012-05-02 MED ORDER — CHLORHEXIDINE GLUCONATE 4 % EX LIQD
1.0000 "application " | Freq: Once | CUTANEOUS | Status: DC
  Filled 2012-05-02: qty 15

## 2012-05-02 NOTE — Patient Instructions (Addendum)
21 Paul Shannon  05/02/2012   Your procedure is scheduled on:  05/11/12  Report to SHORT STAY DEPT  at 6:00 AM.  Call this number if you have problems the morning of surgery: [phone]   Remember:   Do not eat food or drink liquids AFTER MIDNIGHT    Take these medicines the morning of surgery with A SIP OF WATER: LEVOTHYROXINE   Do not wear jewelry, make-up or nail polish.  Do not wear lotions, powders, or perfumes.   Do not shave legs or underarms 48 hrs. before surgery (men may shave face)  Do not bring valuables to the hospital.  Contacts, dentures or bridgework may not be worn into surgery.  Leave suitcase in the car. After surgery it may be brought to your room.  For patients admitted to the hospital, checkout time is 11:00 AM the day of discharge.   Patients discharged the day of surgery will not be allowed to drive home.    Special Instructions:   Please read over the following fact sheets that you were given: MRSA  Information               SHOWER WITH BETASEPT THE NIGHT BEFORE SURGERY AND THE MORNING OF SURGERY              STOP ALL ASPIRIN PRODUCTS / HERBAL MEDICATIONS AND VITAMINS 7 DAYS PREOP

## 2012-05-03 NOTE — Pre-Procedure Instructions (Signed)
I spoke with Britta Mccreedy at office concerning abnormal CMET. She will ask Dr. Ezzard Standing to review lab.

## 2012-05-08 NOTE — Pre-Procedure Instructions (Signed)
Pt notified of time change from 8:30 am to 7:30 am Instructed to arrive at 5:15 am

## 2012-05-10 NOTE — Progress Notes (Signed)
Pt notified of time change and will report to short stay at [facility] 05/11/12

## 2012-05-11 ENCOUNTER — Ambulatory Visit (HOSPITAL_COMMUNITY): Payer: BC Managed Care – PPO | Admitting: Anesthesiology

## 2012-05-11 ENCOUNTER — Ambulatory Visit (HOSPITAL_COMMUNITY)
Admission: RE | Admit: 2012-05-11 | Discharge: 2012-05-11 | Disposition: A | Discharge status: Home / Self Care | Payer: BC Managed Care – PPO | Source: Ambulatory Visit | Attending: Surgery | Admitting: Surgery

## 2012-05-11 ENCOUNTER — Encounter (HOSPITAL_COMMUNITY): Payer: Self-pay | Admitting: Anesthesiology

## 2012-05-11 ENCOUNTER — Telehealth (INDEPENDENT_AMBULATORY_CARE_PROVIDER_SITE_OTHER): Payer: Self-pay

## 2012-05-11 ENCOUNTER — Ambulatory Visit (HOSPITAL_COMMUNITY): Payer: BC Managed Care – PPO

## 2012-05-11 ENCOUNTER — Encounter (HOSPITAL_COMMUNITY)
Admission: RE | Disposition: A | Discharge status: Home / Self Care | Payer: Self-pay | Source: Ambulatory Visit | Attending: Surgery

## 2012-05-11 ENCOUNTER — Encounter (HOSPITAL_COMMUNITY): Payer: Self-pay | Admitting: *Deleted

## 2012-05-11 DIAGNOSIS — K219 Gastro-esophageal reflux disease without esophagitis: Secondary | ICD-10-CM | POA: Insufficient documentation

## 2012-05-11 DIAGNOSIS — E7800 Pure hypercholesterolemia, unspecified: Secondary | ICD-10-CM | POA: Insufficient documentation

## 2012-05-11 DIAGNOSIS — Z79899 Other long term (current) drug therapy: Secondary | ICD-10-CM | POA: Insufficient documentation

## 2012-05-11 DIAGNOSIS — K829 Disease of gallbladder, unspecified: Secondary | ICD-10-CM

## 2012-05-11 DIAGNOSIS — Z7982 Long term (current) use of aspirin: Secondary | ICD-10-CM | POA: Insufficient documentation

## 2012-05-11 DIAGNOSIS — K8010 Calculus of gallbladder with chronic cholecystitis without obstruction: Secondary | ICD-10-CM | POA: Insufficient documentation

## 2012-05-11 DIAGNOSIS — I10 Essential (primary) hypertension: Secondary | ICD-10-CM | POA: Insufficient documentation

## 2012-05-11 DIAGNOSIS — K7689 Other specified diseases of liver: Secondary | ICD-10-CM | POA: Insufficient documentation

## 2012-05-11 HISTORY — PX: CHOLECYSTECTOMY: SHX55

## 2012-05-11 SURGERY — LAPAROSCOPIC CHOLECYSTECTOMY WITH INTRAOPERATIVE CHOLANGIOGRAM
Anesthesia: General | Site: Abdomen | Wound class: Clean Contaminated

## 2012-05-11 MED ORDER — LACTATED RINGERS IV SOLN: INTRAVENOUS | Status: DC

## 2012-05-11 MED ORDER — ROCURONIUM BROMIDE 100 MG/10ML IV SOLN
INTRAVENOUS | Status: DC | PRN
  Administered 2012-05-11: 40 mg via INTRAVENOUS
  Administered 2012-05-11: 20 mg via INTRAVENOUS

## 2012-05-11 MED ORDER — HYDROCODONE-ACETAMINOPHEN 5-325 MG PO TABS: 1.0000 | ORAL_TABLET | Freq: Four times a day (QID) | ORAL | Status: AC | PRN

## 2012-05-11 MED ORDER — CIPROFLOXACIN IN D5W 400 MG/200ML IV SOLN
INTRAVENOUS | Status: AC
  Filled 2012-05-11: qty 200

## 2012-05-11 MED ORDER — SUCCINYLCHOLINE CHLORIDE 20 MG/ML IJ SOLN
INTRAMUSCULAR | Status: DC | PRN
  Administered 2012-05-11: 100 mg via INTRAVENOUS

## 2012-05-11 MED ORDER — DIATRIZOATE MEGLUMINE 30 % UR SOLN
URETHRAL | Status: DC | PRN
  Administered 2012-05-11: 10 mL

## 2012-05-11 MED ORDER — BUPIVACAINE HCL (PF) 0.25 % IJ SOLN
INTRAMUSCULAR | Status: DC | PRN
  Administered 2012-05-11: 30 mL

## 2012-05-11 MED ORDER — HYDROCODONE-ACETAMINOPHEN 5-325 MG PO TABS
ORAL_TABLET | ORAL | Status: AC
  Filled 2012-05-11: qty 1

## 2012-05-11 MED ORDER — MEPERIDINE HCL 50 MG/ML IJ SOLN: 6.2500 mg | INTRAMUSCULAR | Status: DC | PRN

## 2012-05-11 MED ORDER — LIDOCAINE HCL (CARDIAC) 20 MG/ML IV SOLN
INTRAVENOUS | Status: DC | PRN
  Administered 2012-05-11: 100 mg via INTRAVENOUS

## 2012-05-11 MED ORDER — CIPROFLOXACIN IN D5W 400 MG/200ML IV SOLN
400.0000 mg | INTRAVENOUS | Status: AC
  Administered 2012-05-11: 400 mg via INTRAVENOUS

## 2012-05-11 MED ORDER — EPHEDRINE SULFATE 50 MG/ML IJ SOLN
INTRAMUSCULAR | Status: DC | PRN
  Administered 2012-05-11: 5 mg via INTRAVENOUS

## 2012-05-11 MED ORDER — IOHEXOL 300 MG/ML  SOLN
INTRAMUSCULAR | Status: AC
  Filled 2012-05-11: qty 1

## 2012-05-11 MED ORDER — NEOSTIGMINE METHYLSULFATE 1 MG/ML IJ SOLN
INTRAMUSCULAR | Status: DC | PRN
  Administered 2012-05-11: 5 mg via INTRAVENOUS

## 2012-05-11 MED ORDER — ONDANSETRON HCL 4 MG/2ML IJ SOLN
INTRAMUSCULAR | Status: DC | PRN
  Administered 2012-05-11: 4 mg via INTRAVENOUS

## 2012-05-11 MED ORDER — HYDROMORPHONE HCL PF 1 MG/ML IJ SOLN
0.2500 mg | INTRAMUSCULAR | Status: DC | PRN
  Administered 2012-05-11 (×2): 0.5 mg via INTRAVENOUS

## 2012-05-11 MED ORDER — PROMETHAZINE HCL 25 MG/ML IJ SOLN: 6.2500 mg | INTRAMUSCULAR | Status: DC | PRN

## 2012-05-11 MED ORDER — HYDROCODONE-ACETAMINOPHEN 5-325 MG PO TABS
1.0000 | ORAL_TABLET | ORAL | Status: DC | PRN
  Administered 2012-05-11: 1 via ORAL

## 2012-05-11 MED ORDER — FENTANYL CITRATE 0.05 MG/ML IJ SOLN
INTRAMUSCULAR | Status: DC | PRN
  Administered 2012-05-11 (×5): 50 ug via INTRAVENOUS

## 2012-05-11 MED ORDER — LACTATED RINGERS IR SOLN
Status: DC | PRN
  Administered 2012-05-11: 1000 mL

## 2012-05-11 MED ORDER — GLYCOPYRROLATE 0.2 MG/ML IJ SOLN
INTRAMUSCULAR | Status: DC | PRN
  Administered 2012-05-11: .8 mg via INTRAVENOUS

## 2012-05-11 MED ORDER — MIDAZOLAM HCL 5 MG/5ML IJ SOLN
INTRAMUSCULAR | Status: DC | PRN
  Administered 2012-05-11: 2 mg via INTRAVENOUS

## 2012-05-11 MED ORDER — HYDROMORPHONE HCL PF 1 MG/ML IJ SOLN
INTRAMUSCULAR | Status: AC
  Filled 2012-05-11: qty 1

## 2012-05-11 MED ORDER — LACTATED RINGERS IV SOLN
INTRAVENOUS | Status: DC | PRN
  Administered 2012-05-11 (×2): via INTRAVENOUS

## 2012-05-11 MED ORDER — PROPOFOL 10 MG/ML IV EMUL
INTRAVENOUS | Status: DC | PRN
  Administered 2012-05-11: 200 mg via INTRAVENOUS

## 2012-05-11 MED ORDER — BUPIVACAINE-EPINEPHRINE 0.5% -1:200000 IJ SOLN
INTRAMUSCULAR | Status: AC
  Filled 2012-05-11: qty 1

## 2012-05-11 NOTE — Transfer of Care (Signed)
Immediate Anesthesia Transfer of Care Note  Patient: Paul Shannon  Procedure(s) Performed: Procedure(s) (LRB): LAPAROSCOPIC CHOLECYSTECTOMY WITH INTRAOPERATIVE CHOLANGIOGRAM (N/A) LIVER BIOPSY ()  Patient Location: PACU  Anesthesia Type: General  Level of Consciousness: awake, alert , oriented and patient cooperative  Airway & Oxygen Therapy: Patient Spontanous Breathing and Patient connected to face mask oxygen  Post-op Assessment: Report given to PACU RN, Post -op Vital signs reviewed and stable and Patient moving all extremities  Post vital signs: Reviewed and stable  Complications: No apparent anesthesia complications

## 2012-05-11 NOTE — Telephone Encounter (Signed)
The pharmacist called and states that Dr Ezzard Standing wrote for Vicoprofen 5mg  and it only comes in 7.5/200.  I paged Dr Ezzard Standing and he advised the 7.5 is ok.  I called this back to Lauren.

## 2012-05-11 NOTE — H&P (View-Only) (Signed)
 Re:   Paul Shannon DOB:   11/11/1966 MRN:   [id number]  ASSESSMENT AND PLAN: 1.  Biliary dyskinesia.  I discussed with the patient the indications and risks of gall bladder surgery.  The primary risks of gall bladder surgery include, but are not limited to, bleeding, infection, common bile duct injury, and open surgery.  There is also the risk that the patient may have continued symptoms after surgery.  However, the likelihood of improvement in symptoms and return to the patient's normal status is good. We discussed the typical post-operative recovery course. I tried to answer the patient's questions.  I gave the patient literature about gall bladder surgery.  2.  Esophageal nodule - benign squamous papilloma. 3.  History of diverticulitis. 4.  Hypertension 5.  Seasonal allergies 6.  History of kidney stones. 7.  Questionable right inguinal hernia. 8.  On thyroid replacement.  Chief Complaint  Patient presents with  . Abdominal Pain    new pt- eval GB    REFERRING PHYSICIAN:  Dr. R. Kaplan  HISTORY OF PRESENT ILLNESS: Paul Shannon is a 46 y.o. (DOB: 05/22/1966)  white male whose primary care physician is [name],[name], MD, MD and comes to me today for gall bladder disease.  About one year ago the patient had a history of diverticulitis. This resolved with outpatient antibiotics.  In around February 2013 he developed epigastric abdominal pain. He had vomiting and loose stools. As the symptoms improved he had mild discomfort about every 2 or 3 weeks in the same location.  He's had no fever. His appetite has been good. He has no family history of gallbladder disease.  No history of stomach disease.  No history of liver disease.  No history of gall bladder disease.  No history of pancreas disease.  He has had diverticulitis about one year ago.  HIDA - EF 2% - 02/16/2012 EGD - nodule distal esophagus - 02/27/2012 CT scan - diverticulitis - 04/22/2011    Past Medical History    Diagnosis Date  . Hypertension   . Pure hypercholesterolemia   . Unspecified hypothyroidism   . Overweight   . Esophageal reflux   . Irritable bowel syndrome   . Unspecified hemorrhoids without mention of complication   . Anxiety state, unspecified   . Fatty liver     Dr Medoff   . Diverticulitis   . Arthritis   . History of kidney stones   . Chronic kidney disease     hx of kidney stones      Past Surgical History  Procedure Date  . Knee surgery 2011    right knee   . Tonsillectomy and adenoidectomy   . Liver biopsy   . Colonoscopy       Current Outpatient Prescriptions  Medication Sig Dispense Refill  . aspirin 81 MG tablet Take 81 mg by mouth daily.        . Cyanocobalamin (VITAMIN B 12 PO) Take 1 tablet by mouth daily.      . dicyclomine (BENTYL) 20 MG tablet Take 1 tablet (20 mg total) by mouth 4 (four) times daily as needed.  100 tablet  0  . levothyroxine (SYNTHROID) 200 MCG tablet Take 1 tablet (200 mcg total) by mouth daily.  90 tablet  3  . levothyroxine (SYNTHROID, LEVOTHROID) 200 MCG tablet TAKE 1 TABLET BY MOUTH EVERY DAY  90 tablet  0  . losartan-hydrochlorothiazide (HYZAAR) 100-25 MG per tablet Take 1 tablet by mouth daily.    90 tablet  3  . Multiple Vitamin (MULTIVITAMIN) capsule Take 1 capsule by mouth daily.        . Probiotic Product (PROBIOTIC PO) Take by mouth daily.          Allergies  Allergen Reactions  . Niacin     REACTION: pt states INTOL w/ flushing  . Penicillins     REACTION: childhood - extreme rash  . Rosuvastatin     REACTION: pt states INTOL    REVIEW OF SYSTEMS: Skin:  No history of rash.  No history of abnormal moles. Infection:  No history of hepatitis or HIV.  No history of MRSA. Neurologic:  No history of stroke.  No history of seizure.  No history of headaches. Cardiac:  Has history of hypertension.  No history of seeing a cardiologist. Pulmonary:  Does not smoke cigarettes.  No asthma or bronchitis.  No  OSA/CPAP.  Endocrine:  No diabetes. On thyroid replacement x 10 years. Gastrointestinal:  See HPI. Urologic:  History of kidney stones.  Last attack 5 years ago. Musculoskeletal:  No history of joint or back disease. Hematologic:  No bleeding disorder.  No history of anemia.  Not anticoagulated. Psycho-social:  The patient is oriented.   The patient has no obvious psychologic or social impairment to understanding our conversation and plan.  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  PHYSICAL EXAM: BP 132/94  Pulse 76  Temp(Src) 98 F (36.7 C) (Temporal)  Resp 16  Ht 5' 10" (1.778 m)  Wt 231 lb 9.6 oz (105.053 kg)  BMI 33.23 kg/m2  General: WN WM who is alert and generally healthy appearing.  HEENT: Normal. Pupils equal. Good dentition. Neck: Supple. No mass.  No thyroid mass. Lymph Nodes:  No supraclavicular or cervical nodes. Lungs: Clear to auscultation and symmetric breath sounds.  Has a little bit of a cough today. Heart:  RRR. No murmur or rub.  Abdomen: Soft. No mass. No tenderness. No hernia. Normal bowel sounds.  No abdominal scars.  Questionable right inguinal hernia Rectal: Not done. Extremities:  Good strength and ROM  in upper and lower extremities. Neurologic:  Grossly intact to motor and sensory function. Psychiatric: Has normal mood and affect. Behavior is normal.   DATA REVIEWED: Dr Kaplan's notes and x-rays.  Lacheryl Niesen, MD,  [facility] [address]  [address]   [place], [place]    [address] Phone:  [phone] FAX:  [phone]  

## 2012-05-11 NOTE — Anesthesia Preprocedure Evaluation (Addendum)
Anesthesia Evaluation  Patient identified by MRN, date of birth, ID band Patient awake    Reviewed: Allergy & Precautions, H&P , NPO status , Patient's Chart, lab work & pertinent test results  Airway Mallampati: II TM Distance: >3 FB Neck ROM: Full    Dental No notable dental hx.    Pulmonary neg pulmonary ROS,  breath sounds clear to auscultation  Pulmonary exam normal       Cardiovascular hypertension, Pt. on medications negative cardio ROS  Rhythm:Regular Rate:Normal     Neuro/Psych negative neurological ROS  negative psych ROS   GI/Hepatic negative GI ROS, Neg liver ROS,   Endo/Other  negative endocrine ROSHypothyroidism   Renal/GU negative Renal ROS  negative genitourinary   Musculoskeletal negative musculoskeletal ROS (+)   Abdominal   Peds negative pediatric ROS (+)  Hematology negative hematology ROS (+)   Anesthesia Other Findings   Reproductive/Obstetrics negative OB ROS                          Anesthesia Physical Anesthesia Plan  ASA: II  Anesthesia Plan: General   Post-op Pain Management:    Induction: Intravenous  Airway Management Planned: Oral ETT  Additional Equipment:   Intra-op Plan:   Post-operative Plan: Extubation in OR  Informed Consent: I have reviewed the patients History and Physical, chart, labs and discussed the procedure including the risks, benefits and alternatives for the proposed anesthesia with the patient or authorized representative who has indicated his/her understanding and acceptance.   Dental advisory given  Plan Discussed with: CRNA  Anesthesia Plan Comments:         Anesthesia Quick Evaluation

## 2012-05-11 NOTE — Preoperative (Signed)
Beta Blockers   Reason not to administer Beta Blockers:Not Applicable 

## 2012-05-11 NOTE — Discharge Instructions (Signed)
[facility] - DISCHARGE INSTRUCTIONS TO PATIENT  Return to work on:  10 - 14 days  Activity:  Driving - May drive in 3 to 4 days, if doing well.   Lifting - No lifting > 7 days.  Wound Care:   May shower in two days - 05/13/2012  Diet:  As tolerated  Follow up appointment:  Call Dr. Allene Pyo office [facility]) at [phone] for an appointment in 2 to  3 weeks.  Medications and dosages:  Resume your home medications.  You have a prescription for:  Vicodin  Call Dr. Ezzard Standing or his office  [phone]) if you have:  Temperature greater than 100.4,  Persistent nausea and vomiting,  Severe uncontrolled pain,  Redness, tenderness, or signs of infection (pain, swelling, redness, odor or green/yellow discharge around the site),  Difficulty breathing, headache or visual disturbances,  Any other questions or concerns you may have after discharge.  In an emergency, call 911 or go to an Emergency Department at a nearby hospital.

## 2012-05-11 NOTE — Brief Op Note (Signed)
05/11/2012  12:31 PM  PATIENT:  Paul Shannon, 46 y.o., male, MRN: [id number]  PREOP DIAGNOSIS:  biliary dyskinesia   POSTOP DIAGNOSIS:   Cholecystitis, cholelithiasis, fatty liver with early cirrhotic changes.  PROCEDURE:   Procedure(s): LAPAROSCOPIC CHOLECYSTECTOMY WITH INTRAOPERATIVE CHOLANGIOGRAM, LIVER BIOPSY  Nasal swab MRSA.  SURGEON:   Ovidio Kin, M.D.  ASSISTANT:   None  ANESTHESIA:   general  Elisabeth Cara, CRNA - CRNA Phillips Grout, MD - Anesthesiologist Illene Silver, CRNA - CRNA  General  EBL:  Minimal  ml  BLOOD ADMINISTERED: none  DRAINS: none   LOCAL MEDICATIONS USED:   25 cc 1/4% marcaine  SPECIMEN:   Gall bladder and liver biopsy  COUNTS CORRECT:  YES  INDICATIONS FOR PROCEDURE:  Paul Shannon is a 46 y.o. (DOB: June 03, 1966) white male whose primary care physician is [name], MD, MD and comes for cholecystectomy.  I also talked to Dr. Kriste Basque about biopsying his liver since he has increasing liver function test.   The indications and risks of the surgery were explained to the patient.  The risks include, but are not limited to, infection, bleeding, and nerve injury.  Note dictated to:   #[id number]

## 2012-05-11 NOTE — Progress Notes (Signed)
O.K. TO TAKE PATIENT TO SHORT STAY FOR DISCHARGE- PER DR. CARIGNAN

## 2012-05-11 NOTE — Anesthesia Postprocedure Evaluation (Signed)
  Anesthesia Post-op Note  Patient: Paul Shannon  Procedure(s) Performed: Procedure(s) (LRB): LAPAROSCOPIC CHOLECYSTECTOMY WITH INTRAOPERATIVE CHOLANGIOGRAM (N/A) LIVER BIOPSY ()  Patient Location: PACU  Anesthesia Type: General  Level of Consciousness: awake and alert   Airway and Oxygen Therapy: Patient Spontanous Breathing  Post-op Pain: mild  Post-op Assessment: Post-op Vital signs reviewed, Patient's Cardiovascular Status Stable, Respiratory Function Stable, Patent Airway and No signs of Nausea or vomiting  Post-op Vital Signs: stable  Complications: No apparent anesthesia complications

## 2012-05-11 NOTE — Interval H&P Note (Signed)
History and Physical Interval Note:  05/11/2012 10:27 AM  Paul Shannon  has presented today for surgery, with the diagnosis of biliary dyskinesia   The various methods of treatment have been discussed with the patient and family. The patient's wife is here.  Also, I spoke with Dr. Kriste Basque.  [name] has some increase in his liver function test and he asked that I do a liver biopsy.  I discussed this with the patient and he agrees.  I told him there was an increase risks of bleeding.  After consideration of risks, benefits and other options for treatment, the patient has consented to  Procedure(s) (LRB): LAPAROSCOPIC CHOLECYSTECTOMY WITH INTRAOPERATIVE CHOLANGIOGRAM and liver biopsy as a surgical intervention .    The patients' history has been reviewed, patient examined, no change in status, stable for surgery.  I have reviewed the patients' chart and labs.  Questions were answered to the patient's satisfaction.     [name] H

## 2012-05-12 NOTE — Op Note (Signed)
NAMEMILON, DETHLOFF                 ACCOUNT NO.:  [id number]  MEDICAL RECORD NO.:  [id number]  LOCATION:  [facility]                         FACILITY:  [facility]  PHYSICIAN:  Sandria Bales. Ezzard Standing, M.D.  DATE OF BIRTH:  12/29/1965  DATE OF PROCEDURE: DATE OF DISCHARGE:  05/11/2012                              OPERATIVE REPORT   An unedited report.  For final edited dictation, please contact [facility]/Liz Katrinka Blazing.  PREOPERATIVE DIAGNOSIS:  Biliary dyskinesia.  POSTOPERATIVE DIAGNOSES:  Mild chronic cholecystitis, cholelithiasis, fatty liver with early cirrhotic changes.  PROCEDURES:  Laparoscopic cholecystectomy with intraoperative cholangiogram and Tru-Cut biopsy of liver.  SURGEON:  Dr. Ezzard Standing.  FIRST ASSISTANT:  None.  ANESTHESIA:  General.  ESTIMATED BLOOD LOSS:  Minimal.  LOCAL ANESTHETIC:  25 mL of 0.25% Marcaine.  HISTORY OF ILLNESS:  Mr. Cuadros is a 46 year old white male who is a patient Dr. Alroy Dust.  He has had some vague abdominal pain, seen Dr. Melvia Heaps.  Dr. Arlyce Dice obtained a HIDA scan, which showed an ejection fraction of 2% and referred to me for evaluation of gallbladder surgery.  I reviewed with the patient indications and potential complications of gallbladder surgery.  Potential complications include, but are not limited to bleeding, infection, common bile duct injury, and open surgery.  OPERATIVE NOTE:  The patient was taken to room 11, where he underwent a general endotracheal anesthetic as supervised by Dr. Phillips Grout.  A time-out was held and surgical checklist run.  The abdomen was prepped with ChloraPrep and sterilely draped.  I accessed the abdominal cavity with a 0 degree 10 mm laparoscope through a 12 mm Hasson trocar into an infraumbilical incision.  I placed a 5 mm in the subxiphoid, a 5 mm in the right mid subcostal, and a 5 mm in the right lateral subcostal location.  Abdominal exploration revealed actually both lobes of the  liver were somewhat nodular, fatty infiltrated.  This was consistent with probably an early cirrhotic changes of his liver.  I did take a Tru-Cut biopsy of the right lobe of the liver, I did a single biopsy and sent this to Pathology.  I then turned my attention to the gallbladder.  The gallbladder was somewhat fatty, around the neck of the gallbladder, I dissected down to identify the cystic artery and cystic duct.  A clip was placed on the gallbladder side of the cystic duct and I did an intraoperative cholangiogram.  The intraoperative cholangiogram was done using half-strength Hypaque solution.  I placed a Taut catheter through a 14-gauge Jelco into the side of the cut cystic duct and secured this with an Endo clip.  I then used 10 mL of half-strength Hypaque solution injection. This showed free flow down the cystic duct into the common bile duct, down to the duodenum up to the hepatic radicals.  There actually seemed to have a low takeoff of a right hepatic radicle.  Taut catheter was then removed.  The gallbladder cystic duct then triply Endoclipped and divided.  The gallbladder was then sharply and bluntly dissected from the gallbladder bed.  Bleeding was controlled with Bovie electrocautery.  After I removed the gallbladder, I rechecked  the gallbladder bed, then the triangle of Calot.  There was no bleeding or bile leak.  I then placed the gallbladder in a bag and delivered it through the umbilicus.  On cutting the cystic duct, I did my cholangiogram.  I did note that he had some stone debris material in the duct, I milked out.  I think this was probably causing his poor biliary function.  I then put the scope back in the belly.  I irrigated the abdomen about 300 mL of saline.  There was no bleeding or bile leak.  I relooked at the liver biopsy site.  I then removed the umbilical trocar, closed it with a 0 Vicryl suture.  I removed all the trocars, the 3 trocars under the  ribs, and closed the skin at each site with a 5-0 Vicryl suture, painted with Dermabond.  The patient tolerated the procedure well, was transported to the recovery room in good condition.  Sponge, needle count were correct at the end of the case.     Sandria Bales. Ezzard Standing, M.D.     DHN/MEDQ  D:  05/11/2012  T:  05/12/2012  Job:  [id number]  cc:   Lonzo Cloud. Kriste Basque, MD [address] [place] [address]  Barbette Hair. Arlyce Dice, MD,FACG [address] [place] [address]

## 2012-05-15 ENCOUNTER — Telehealth (INDEPENDENT_AMBULATORY_CARE_PROVIDER_SITE_OTHER): Payer: Self-pay

## 2012-05-15 NOTE — Telephone Encounter (Signed)
Message copied by Ivory Broad on Tue May 15, 2012  2:05 PM ------      Message from: Erin Sons      Created: Tue May 15, 2012 11:16 AM       Please call pt at [phone]. Pt had sx on 05/11/12 for GB and need 2-3 wk PO appt.            Thanks

## 2012-05-15 NOTE — Telephone Encounter (Signed)
I made an appt for 06/01/12 at 2:50pm and called the pt.

## 2012-05-18 ENCOUNTER — Encounter: Payer: BC Managed Care – PPO | Admitting: Pulmonary Disease

## 2012-05-24 ENCOUNTER — Telehealth: Payer: Self-pay

## 2012-05-24 NOTE — Telephone Encounter (Signed)
Pt scheduled to see Dr. Arlyce Dice 06/18/12@10 :30am. Appt letter mailed to pt.

## 2012-05-24 NOTE — Telephone Encounter (Signed)
Message copied by Chrystie Nose on Thu May 24, 2012 11:30 AM ------      Message from: Melvia Heaps D      Created: Thu May 24, 2012 11:23 AM       No rush, 2-3 weeks      ----- Message -----         From: Lily Lovings, RN         Sent: 05/24/2012   9:22 AM           To: Louis Meckel, MD            How soon do you want him seen?      ----- Message -----         From: Louis Meckel, MD         Sent: 05/24/2012   9:16 AM           To: Lily Lovings, RN            Please schedule an OV

## 2012-06-01 ENCOUNTER — Ambulatory Visit (INDEPENDENT_AMBULATORY_CARE_PROVIDER_SITE_OTHER): Payer: BC Managed Care – PPO | Admitting: Surgery

## 2012-06-01 ENCOUNTER — Encounter (INDEPENDENT_AMBULATORY_CARE_PROVIDER_SITE_OTHER): Payer: Self-pay | Admitting: Surgery

## 2012-06-01 VITALS — BP 120/82 | HR 60 | Temp 97.8°F | Resp 12 | Ht 70.0 in | Wt 231.0 lb

## 2012-06-01 DIAGNOSIS — K829 Disease of gallbladder, unspecified: Secondary | ICD-10-CM

## 2012-06-01 NOTE — Progress Notes (Signed)
Re:   Paul Shannon DOB:   11/10/1966 MRN:   [id number]  ASSESSMENT AND PLAN: 1.  Biliary dyskinesia.  S/P lap chole - 05/11/2012 - D. Creedon Danielski  His final path did show some small gall stones.  He has done well, feels well.  His return is PRN.  2.  Esophageal nodule - benign squamous papilloma. 3.  History of diverticulitis. 4.  Hypertension 5.  Seasonal allergies 6.  History of kidney stones. 7.  Questionable right inguinal hernia. 8.  On thyroid replacement. 9.  Liver with 70% steatosis changes.  He has a NASH score of 8.  I gave him a copy of the path report.  He is to see Dr. Kriste Basque next week.    Chief Complaint  Patient presents with  . Routine Post Op   REFERRING PHYSICIAN:  Dr. Darrel Hoover  HISTORY OF PRESENT ILLNESS: Paul Shannon is a 46 y.o. (DOB: 1966/04/02)  white male whose primary care physician is [name],[name], MD and comes to me follow up of lap chole and liver biopsy.  He has done well.  Feels good.  Has no concerns.  We talked about his path some.  He has already made some dietary changes.   Past Medical History  Diagnosis Date  . Hypertension   . Unspecified hypothyroidism   . Overweight   . Esophageal reflux   . Irritable bowel syndrome   . Unspecified hemorrhoids without mention of complication   . Anxiety state, unspecified   . Fatty liver     Dr Kinnie Scales   . Diverticulitis   . Arthritis   . History of kidney stones   . Kidney stone   . Biliary dyskinesia      Current Outpatient Prescriptions  Medication Sig Dispense Refill  . aspirin 81 MG tablet Take 81 mg by mouth daily with breakfast.       . Cyanocobalamin (VITAMIN B 12 PO) Take 1 tablet by mouth daily.      [name] dicyclomine (BENTYL) 20 MG tablet Take 20 mg by mouth daily.      [name] levothyroxine (SYNTHROID, LEVOTHROID) 200 MCG tablet Take 200 mcg by mouth daily.      [name] losartan-hydrochlorothiazide (HYZAAR) 100-25 MG per tablet Take 1 tablet by mouth daily.      . Multiple Vitamin (MULTIVITAMIN)  capsule Take 1 capsule by mouth daily.        . Probiotic Product (PROBIOTIC PO) Take 1 capsule by mouth daily with breakfast.           Allergies  Allergen Reactions  . Niacin     REACTION: pt states INTOL w/ flushing  . Other     Red dye=rash   . Penicillins     REACTION: childhood - extreme rash  . Rosuvastatin     REACTION: pt states INTOL    REVIEW OF SYSTEMS: Cardiac:  Has history of hypertension.  No history of seeing a cardiologist. Endocrine:  No diabetes. On thyroid replacement x 10 years. Gastrointestinal:  Liver biopsy with steatosis. Urologic:  History of kidney stones.  Last attack 5 years ago.  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 Raised by grandparents.  PHYSICAL EXAM: BP 120/82  Pulse 60  Temp 97.8 F (36.6 C) (Temporal)  Resp 12  Ht 5\' 10"  (1.778 m)  Wt 231 lb (104.781 kg)  BMI 33.15 kg/m2  General: WN WM who is alert and generally healthy appearing. Abdomen: Soft. Incisions well healed and  look good.  DATA REVIEWED: Path report.  Ovidio Kin, MD,  [facility] [address]  [address]   [place], [place]    [address] Phone:  [phone] FAX:  [phone]

## 2012-06-12 ENCOUNTER — Ambulatory Visit (INDEPENDENT_AMBULATORY_CARE_PROVIDER_SITE_OTHER): Payer: BC Managed Care – PPO | Admitting: Pulmonary Disease

## 2012-06-12 ENCOUNTER — Other Ambulatory Visit (INDEPENDENT_AMBULATORY_CARE_PROVIDER_SITE_OTHER): Payer: BC Managed Care – PPO

## 2012-06-12 ENCOUNTER — Encounter: Payer: Self-pay | Admitting: Pulmonary Disease

## 2012-06-12 VITALS — BP 122/80 | HR 65 | Temp 97.0°F | Ht 69.0 in | Wt 230.4 lb

## 2012-06-12 DIAGNOSIS — E039 Hypothyroidism, unspecified: Secondary | ICD-10-CM

## 2012-06-12 DIAGNOSIS — K219 Gastro-esophageal reflux disease without esophagitis: Secondary | ICD-10-CM

## 2012-06-12 DIAGNOSIS — Z0000 Encounter for general adult medical examination without abnormal findings: Secondary | ICD-10-CM

## 2012-06-12 DIAGNOSIS — K589 Irritable bowel syndrome without diarrhea: Secondary | ICD-10-CM

## 2012-06-12 DIAGNOSIS — F411 Generalized anxiety disorder: Secondary | ICD-10-CM

## 2012-06-12 DIAGNOSIS — E663 Overweight: Secondary | ICD-10-CM

## 2012-06-12 DIAGNOSIS — K5730 Diverticulosis of large intestine without perforation or abscess without bleeding: Secondary | ICD-10-CM

## 2012-06-12 DIAGNOSIS — K7689 Other specified diseases of liver: Secondary | ICD-10-CM

## 2012-06-12 DIAGNOSIS — E7800 Pure hypercholesterolemia, unspecified: Secondary | ICD-10-CM

## 2012-06-12 DIAGNOSIS — I10 Essential (primary) hypertension: Secondary | ICD-10-CM

## 2012-06-12 DIAGNOSIS — M1990 Unspecified osteoarthritis, unspecified site: Secondary | ICD-10-CM

## 2012-06-12 LAB — LIPID PANEL
Cholesterol: 123 mg/dL (ref 0–200)
LDL Cholesterol: 65 mg/dL (ref 0–99)
Triglycerides: 123 mg/dL (ref 0.0–149.0)
VLDL: 24.6 mg/dL (ref 0.0–40.0)

## 2012-06-12 LAB — BASIC METABOLIC PANEL
BUN: 13 mg/dL (ref 6–23)
Calcium: 9.4 mg/dL (ref 8.4–10.5)
Creatinine, Ser: 0.8 mg/dL (ref 0.4–1.5)
GFR: 106.3 mL/min (ref >60.00)

## 2012-06-12 LAB — TSH: TSH: 0.19 u[IU]/mL — ABNORMAL LOW (ref 0.35–5.50)

## 2012-06-12 LAB — HEPATIC FUNCTION PANEL
ALT: 128 U/L — ABNORMAL HIGH (ref 0–53)
Total Bilirubin: 1.8 mg/dL — ABNORMAL HIGH (ref 0.3–1.2)

## 2012-06-12 MED ORDER — DICYCLOMINE HCL 20 MG PO TABS: 20.0000 mg | ORAL_TABLET | Freq: Every day | ORAL | Status: DC

## 2012-06-12 MED ORDER — LOSARTAN POTASSIUM-HCTZ 100-25 MG PO TABS: 1.0000 | ORAL_TABLET | Freq: Every day | ORAL | Status: DC

## 2012-06-12 MED ORDER — POTASSIUM CHLORIDE CRYS ER 20 MEQ PO TBCR: 20.0000 meq | EXTENDED_RELEASE_TABLET | Freq: Two times a day (BID) | ORAL | Status: DC

## 2012-06-12 MED ORDER — LEVOTHYROXINE SODIUM 200 MCG PO TABS: 200.0000 ug | ORAL_TABLET | Freq: Every day | ORAL | Status: DC

## 2012-06-12 NOTE — Progress Notes (Addendum)
Subjective:     Patient ID: Paul Shannon, male   DOB: January 30, 1966, 46 y.o.   MRN: [id number]  HPI Review of Systems   Physical Exam       Subjective:    Patient ID: Paul Shannon, male    DOB: December 15, 1965, 46 y.o.   MRN: [id number]  HPI 46 y/o WM here for a follow up visit & CPX... he has multiple medical problems as noted below...   ~  March 02, 2009:  he informs me that he now has a new Engineering geologist thru work where he pays the first $6,000 out of pocket (w/ a health savings acct helping) & lower premiums for the rest of the plan (catastrophic coverage)... he has to pay for everything initially, meds included, except for a Physical Exam (?including CXR, EKG, labs), which is covered completely once per year... we discussed his med Rx- Levothyroid & Hyzaar- he will check w/ Pharm re: cash cost of the ARB's.  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  also has some right lat epicondylitis pain- try soaks, heat MOBIC (may need injection)... BP appears controlled on medication;  trying to control Chol w/ diet alone & intol to Cres10 last yr;  weight is about the same and desperately needs to get wt down, esp in view of his steatosis.  ~  June 01, 2011:  37mo ROV & post hosp visit> He was hospitalized 5/12 w/ acute diverticulitis, treated w/ Cipro & Flagyl & slowly improved; CT Abd revealed acute diverticulitis in prox sigmoid w/ perforation & pericolonic inflamm, and fatty liver disease; [facility] consult by DrCornett w/ rec for med rx & no surg intervention; he has a post hosp follow up appt w/ DrKaplan soon...     Medically [place] has been OK> BP controlled on Losartan; note- CXR & EKG were not done during his recent hosp...    We did f/u  Fasting labs and FLP looks good on diet alone now x for low HDL at 30 & advised incr exercise, get wt down; despite the hosp he hasn't lost much wt.    Clinically euthyroid on large dose of Synthroid & TSH= 1.09    He has some DJD knees and he right knee arthroscopy 10/11 by DrWainer...  ~  July 2. 2013:  1 year ROV & CPX> Mohamud developed abd pain 3/13 & had eval DrKaplan w/ an abn hepatobiliary scan showing an abn low ejection fraction of 2% (norm>30%); He had elev LFTs as well but this was felt to be more likely related to his known Hepatic Steatosis; he was referred to [facility] for Cholecystectomy & intraoperative liver biopsy to further investigate the status of his liver disease (recall hx of father passing away w/ cirrhosis due to this condition) as his last bx was in the 75's;  He did well w/ surgery & had neg intraop cholangiogram, & an uneventful recovery;  Liver Bx howevere showed progressive HepSteatosis- 70% steatosis & total NASH score of 8 (out of poss 9);  He has a follow up appt w/ DrKaplan to consider his options as he is one sm step away from cirrhosis at this juncture & Bariatric surg may  be an option...    We reviewed prob list, meds, xrays and labs> see below for updates>> LABS 5/13 in hosp:  Elev LFTs as noted;  CBC- ok  LABS 7/13:  FLP- ok on diet alone;  Chems- ok x persist incr LFTs due to steatosis;  TSH= 0.19 on Synth200 & we decided to slowly wean...    Problem List:  HYPERTENSION (ICD-401.9) - controlled on HYZAAR 100-25 daily... BP=116/76 today, & he states OK at home ~130's to 140/ 80's-90... denies HA, fatigue, visual changes, CP, palipit, dizziness, syncope, dyspnea, edema, etc...  ~  CXR 4/11 showed low lung vol's but clear, normal Cor, NAD. ~  EKG showed NSR rate 60, no signif STTWA's, WNL. ~  CXR 5/13 showed normal heart size, clear lungs, NAD...  HYPERCHOLESTEROLEMIA (ICD-272.0) - he has refused meds preferring to attempt control on diet alone... he was intol to  Niacin in past w/ flushing... intol to Crestor10 when tried in Sep09... ~  FLP 3/08 showed TChol 143, TG 190, HDL 30, LDL 75... rec to start fibrate- he declined meds. ~  FLP 9/09 showed TChol 143, TG 123, HDL 20, LDL 99... rec to start Crestor10mg /d- he agrees. ~  pt states that he didn't tolerate the Crestor10 either, therefore stopped it. ~  FLP 4/11 showed TChol 130, TG 94, HDL 33, LDL 79... much improved, on diet alone. ~  FLP 6/12 on diet alone (after divertic episode) showed TChol 131, TG 112, HDL 30, LDL 78... rec incr exercise. ~  FLP 7/13 on diet alone showed TChol 123, TG 123, HDL 34, LDL 65  HYPOTHYROIDISM (ICD-244.9) - on SYNTHROID 254mcg/d w/ ?compliance issues...  ~  labs 3/08 showed TSH=20.5 on Synthroid 155mcg/d and dose increased to 200. ~  labs 9/09 showed TSH= 6.65 on 253mcg/d... continue same med. ~  ran out of meds Feb10 and didn't refill- labs 3/10 showed TSH= 93... rec> restart LEVOTHY200. ~  labs 4/11 showed TSH= 0.61... clinically euthyroid, may need to cut back dose. ~  Labs 6/12 showed TSH= 1.09 ~  Labs 7/13 on Synthroid200 showed TSH= 0.19 & we decided to decr the Synthroid to 140mcg/d...  OVERWEIGHT (ICD-278.02) - since we first met Traye in 1997 his weight has been 220-230 range... occas down to 210-215, and once down to 205# in 2001... highest weight has been 232#.[name]. ~  weight 3/10 = 230# ~  weight 4/11 = 228# ~  Weight 6/12 = 221#... This was after his diverticulitis episode. ~  Weight 7/13 = 235#  GERD (ICD-530.81) - uses OTC Prilosec as needed. ~  EGD 3/13 by DrKaplan showed a benign nodule in the distal esoph, otherw neg...  DIVERTICULITIS>  Adm to hosp 5/12 w/ acute sigmoid diverticulitis;  treated w/ Cipro & Flagyl & slowly improved; CT Abd revealed acute diverticulitis in prox sigmoid w/ perforation & pericolonic inflamm, and fatty liver disease; [facility]CS consult by DrCornett w/ rec for med rx & no surg intervention; he has a post hosp follow up appt w/  DrKaplan ==> pending 6/12 ~  Colonoscopy 6/12 by DrKaplan showed mod divertics in the sigmoid, otherw neg & f/u planned 15yrs...  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 he uses OTC PrepH.  FATTY LIVER DISEASE (ICD-571.8) -  he has hepatic steatosis w/ prev GI eval 1999 by [facility] including a liver Bx... there is a pos family hx of cirrhosis in his father who died from this disease... he has  been counselled on weight reduction and told to avoid hepatotoxins including alcohol...  he has discussed Hepatic Steatosis/ Fatty Liver Disease w/ GI, and we have provided him w/ mult hand-out about this condition...  he understands that it can lead to cirrhosis and death from liver failure... he understands that he must get his weight down, control his fat intake, and follow the FLP & LFT's carefully... ~  LFTs 3/08 showed SGOT= 61, SGPT= 100... rec> get wt down. ~  LFTs 9/09 showed SGOT= 43, SGPT= 62... improved. ~  LFTs 2/10 showed SGOT= 29, SGPT= 39... normal! ~  LFTs 4/11 showed SGOT= 60, SGPT= 97... must diet & get wt down! 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 ~  LFTs 6/13 showed SGOT= 121, SGPT= 164... Pt referred back to GI for his severe hepatic steatosis...  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  ANXIETY (ICD-300.00) - uses Alprazolam Prn...  DERM - mult tattoos...  Health Maintenance -  ~  GU:  age 30, PSA checked 2/10 due to symptoms and normal at 0.85... ~  GI:  had FlexSig in 1998 for IBS symptoms by [facility] & it was neg... GI f/u pending w/ DrKaplan s/p diverticulitis. ~  Immunizations:  ? last Tetanus shot (he agrees to TDAP today)... hasn't had Pneumovax, Flu shots (refuses), or HepB series...  << NOTE: GIVEN COMBINED HEP A & HEP B  VACCINE BY [facility] 7-8/13 >>   Past Surgical History  Procedure Date  . Knee surgery 2011    right knee   . Tonsillectomy and adenoidectomy   . Liver biopsy   . Colonoscopy   . Cholecystectomy 05/11/2012    Outpatient Encounter Prescriptions as of 06/12/2012  Medication Sig Dispense Refill  . aspirin 81 MG tablet Take 81 mg by mouth daily with breakfast.       . Cyanocobalamin (VITAMIN B 12 PO) Take 1 tablet by mouth daily.      [name] dicyclomine (BENTYL) 20 MG tablet Take 20 mg by mouth daily.      [name] levothyroxine (SYNTHROID, LEVOTHROID) 200 MCG tablet Take 200 mcg by mouth daily.      [name] losartan-hydrochlorothiazide (HYZAAR) 100-25 MG per tablet Take 1 tablet by mouth daily.      . Multiple Vitamin (MULTIVITAMIN) capsule Take 1 capsule by mouth daily.        . potassium chloride SA (K-DUR,KLOR-CON) 20 MEQ tablet Take 20 mEq by mouth 2 (two) times daily.      . Probiotic Product (PROBIOTIC PO) Take 1 capsule by mouth daily with breakfast.         Allergies  Allergen Reactions  . Niacin     REACTION: pt states INTOL w/ flushing  . Other     Red dye=rash   . Penicillins     REACTION: childhood - extreme rash  . Rosuvastatin     REACTION: pt states INTOL    Current Medications, Allergies, Past Medical History, Past Surgical History, Family History, and Social History were reviewed in [employer] record.   Review of Systems        The patient complains of joint pain and paresthesias.  The patient denies fever, chills, sweats, anorexia, fatigue, weakness, malaise, weight loss, sleep disorder, blurring, diplopia, eye irritation, eye discharge, vision loss,  eye pain, photophobia, earache, ear discharge, tinnitus, decreased hearing, nasal congestion, nosebleeds, sore throat, hoarseness, chest pain, palpitations, syncope, dyspnea on exertion, orthopnea, PND, peripheral edema, cough, dyspnea at rest, excessive sputum, hemoptysis, wheezing, pleurisy, nausea,  vomiting, diarrhea, constipation, change in bowel habits, abdominal pain, melena, hematochezia, jaundice, gas/bloating, indigestion/heartburn, dysphagia, odynophagia, dysuria, hematuria, urinary frequency, urinary hesitancy, nocturia, incontinence, back pain, joint swelling, muscle cramps, muscle weakness, stiffness, arthritis, sciatica, restless legs, leg pain at night, leg pain with exertion, rash, itching, dryness, suspicious lesions, paralysis, seizures, tremors, vertigo, transient blindness, frequent falls, frequent headaches, difficulty walking, depression, anxiety, memory loss, confusion, cold intolerance, heat intolerance, polydipsia, polyphagia, polyuria, unusual weight change, abnormal bruising, bleeding, enlarged lymph nodes, urticaria, allergic rash, hay fever, and recurrent infections.     Objective:   Physical Exam      WD, Overweight, 46 y/o WM in NAD... GENERAL:  Alert & oriented; pleasant & cooperative... HEENT:  [place]/AT, EOM-wnl, PERRLA, EACs-clear, TMs-wnl, NOSE-clear, THROAT-clear & wnl... NECK:  Supple w/ full ROM; no JVD; normal carotid impulses w/o bruits; no thyromegaly or nodules palpated; no lymphadenopathy... CHEST:  Clear to P & A; without wheezes/ rales/ or rhonchi heard... HEART:  Regular Rhythm; without murmurs/ rubs/ or gallops detected... ABDOMEN:  Soft & min tender LLQ; normal bowel sounds; no organomegaly or masses palpated... RECTAL:  neg- prostate 2+ normal, stool heme neg, few ext hem's noted... EXT: without deformities, mild arthritic changes;   NEURO:  CN's intact; motor testing normal; sensory testing normal; gait normal & balance OK. DERM:  No lesions noted; no rash, several tatoos...   Assessment & Plan:   HBP>  Controlled on Hyzaar, continue same, get wt down... NOTE: Labs today w/ K=3.0 & we will supplement w/ K20Bid  CHOL>  Improved w/ sl wt reduction after the diverticulitis, advised to keep up the wt reduction efforts & loew fat  diet...  HYPOTHYROID>  Stable on the Synthroid 276mcg/d dose, clinically & biochem euthyroid...  OBESITY>  Needs to continue wt reduction for the sake of his NAFLD...  GI>  GERD/ Divertics/ IBS/ Fatty Liver>  Consult w/ DrKaplan pending...  DIVERTICULITIS>  AS ABOVE- acute diverticulitis 5/12 w/ perf & pericolonic inflamm, improved w/ antibiotic Rx & f/u colon 6/12 was otherw neg...  NAFLD>  This is his most serious medical condition w/ liver bx proving marked progression over the last 10+yrs; his father died from this disease; he should consider bariatric surg & has another appt w/ GI coming up...  DJD> s/p right knee arthroscopy by DrWainer 10/11...   Patient's Medications  New Prescriptions   HYDROCORTISONE (ANUSOL-HC) 2.5 % RECTAL CREAM    Place rectally 2 (two) times daily. Apply as directed   LEVOTHYROXINE (SYNTHROID, LEVOTHROID) 175 MCG TABLET    Take 1 tablet (175 mcg total) by mouth daily.  Previous Medications   ASPIRIN 81 MG TABLET    Take 81 mg by mouth daily with breakfast.    CYANOCOBALAMIN (VITAMIN B 12 PO)    Take 1 tablet by mouth daily.   MULTIPLE VITAMIN (MULTIVITAMIN) CAPSULE    Take 1 capsule by mouth daily.     PROBIOTIC PRODUCT (PROBIOTIC PO)    Take 1 capsule by mouth daily with breakfast.   Modified Medications   Modified Medication Previous Medication   DICYCLOMINE (BENTYL) 20 MG TABLET dicyclomine (BENTYL) 20 MG tablet      Take 1 tablet (20 mg total) by mouth daily.    Take 20 mg by mouth daily.  LOSARTAN-HYDROCHLOROTHIAZIDE (HYZAAR) 100-25 MG PER TABLET losartan-hydrochlorothiazide (HYZAAR) 100-25 MG per tablet      Take 1 tablet by mouth daily.    Take 1 tablet by mouth daily.   POTASSIUM CHLORIDE SA (K-DUR,KLOR-CON) 20 MEQ TABLET potassium chloride SA (K-DUR,KLOR-CON) 20 MEQ tablet      Take 1 tablet (20 mEq total) by mouth 2 (two) times daily.    Take 20 mEq by mouth 2 (two) times daily.  Discontinued Medications   LEVOTHYROXINE (SYNTHROID,  LEVOTHROID) 200 MCG TABLET    Take 200 mcg by mouth daily.   LEVOTHYROXINE (SYNTHROID, LEVOTHROID) 200 MCG TABLET    Take 1 tablet (200 mcg total) by mouth daily.

## 2012-06-12 NOTE — Patient Instructions (Addendum)
Today we updated your med list in our EPIC system...    Continue your current medications the same...    We refilled the meds you requested & the KCl twice daily...  Today we did your follow up FASTING blood work...  I'm glad everything turned out well on your gallbladder surg...    Now we can ask DrKaplan about options for the Fatty Liver dis...  Call if you have any questions or if we can be of service in any way.Marland KitchenMarland Kitchen

## 2012-06-15 ENCOUNTER — Other Ambulatory Visit: Payer: Self-pay | Admitting: Pulmonary Disease

## 2012-06-15 MED ORDER — HYDROCORTISONE 2.5 % RE CREA: TOPICAL_CREAM | Freq: Two times a day (BID) | RECTAL | Status: AC

## 2012-06-15 MED ORDER — LEVOTHYROXINE SODIUM 175 MCG PO TABS: 175.0000 ug | ORAL_TABLET | Freq: Every day | ORAL | Status: DC

## 2012-06-18 ENCOUNTER — Ambulatory Visit (INDEPENDENT_AMBULATORY_CARE_PROVIDER_SITE_OTHER): Payer: BC Managed Care – PPO | Admitting: Gastroenterology

## 2012-06-18 ENCOUNTER — Other Ambulatory Visit (INDEPENDENT_AMBULATORY_CARE_PROVIDER_SITE_OTHER): Payer: BC Managed Care – PPO

## 2012-06-18 ENCOUNTER — Encounter: Payer: Self-pay | Admitting: Gastroenterology

## 2012-06-18 VITALS — BP 132/74 | HR 68 | Ht 70.0 in | Wt 231.0 lb

## 2012-06-18 DIAGNOSIS — K7460 Unspecified cirrhosis of liver: Secondary | ICD-10-CM

## 2012-06-18 DIAGNOSIS — K7689 Other specified diseases of liver: Secondary | ICD-10-CM

## 2012-06-18 LAB — IBC PANEL: Saturation Ratios: 27.7 % (ref 20.0–50.0)

## 2012-06-18 NOTE — Assessment & Plan Note (Addendum)
The patient clearly has well-established Paul Shannon with high risk for cirrhosis. These findings were discussed with the patient. The only reliable treatment for Paul Shannon is  weight loss. Accordingly, bariatric surgery was discussed since the patient feels that he cannot reliably lose and sustain a weight loss.  Recommendations #1 check serologies for hepatitis A, B, and C, AMA, ANA, alpha one antitrypsin level, ceruloplasmin level, iron, TIBC and ferritin levels #2 if above blood tests are negative he will be vaccinated against viral hepatitis and referred to Dr. Ezzard Standing for consideration of bariatric surgery.

## 2012-06-18 NOTE — Progress Notes (Signed)
History of Present Illness:  Paul Shannon has returned following cholecystectomy and liver biopsy. Chronic cholecystitis was noted by pathology. Since surgery he has had no further episodes of abdominal pain. Liver biopsy demonstrated severe hepatic steatosis with early bridging and nodular formation. There is no history of hepatitis, alcohol abuse or IV drug use. He has tattoos. Father developed cirrhosis and died in his 53s.    Review of Systems: Pertinent positive and negative review of systems were noted in the above HPI section. All other review of systems were otherwise negative.    Current Medications, Allergies, Past Medical History, Past Surgical History, Family History and Social History were reviewed in [employer] electronic medical record  Vital signs were reviewed in today's medical record. Physical Exam: General: Well developed , well nourished, no acute distress Head: Normocephalic and atraumatic Eyes:  sclerae anicteric, EOMI Ears: Normal auditory acuity Mouth: No deformity or lesions Lungs: Clear throughout to auscultation Heart: Regular rate and rhythm; no murmurs, rubs or bruits Abdomen: Soft, non tender and non distended. No masses, hepatosplenomegaly or hernias noted. Normal Bowel sounds Rectal:deferred Musculoskeletal: Symmetrical with no gross deformities  Pulses:  Normal pulses noted Extremities: No clubbing, cyanosis, edema or deformities noted Neurological: Alert oriented x 4, grossly nonfocal Psychological:  Alert and cooperative. Normal mood and affect

## 2012-06-18 NOTE — Patient Instructions (Addendum)
You will go to the basement for labs today  

## 2012-06-19 LAB — HEPATITIS C RNA QUANTITATIVE: HCV Quantitative: NOT DETECTED IU/mL (ref <43)

## 2012-06-19 LAB — HEPATITIS PANEL, ACUTE
Hep A IgM: NEGATIVE
Hep B C IgM: NEGATIVE
Hepatitis B Surface Ag: NEGATIVE

## 2012-06-19 LAB — AFP TUMOR MARKER: AFP-Tumor Marker: 1.7 ng/mL (ref 0.0–8.0)

## 2012-06-25 ENCOUNTER — Telehealth: Payer: Self-pay | Admitting: Gastroenterology

## 2012-06-25 DIAGNOSIS — K760 Fatty (change of) liver, not elsewhere classified: Secondary | ICD-10-CM

## 2012-06-25 NOTE — Telephone Encounter (Signed)
yes

## 2012-06-25 NOTE — Telephone Encounter (Signed)
Calling for lab results. °

## 2012-06-25 NOTE — Telephone Encounter (Signed)
Pt aware and referral made to Dr. Ezzard Standing.

## 2012-06-25 NOTE — Telephone Encounter (Signed)
Pt aware and will come tomorrow for twin-rex injection. Per OV note should pt be set up for referral with Dr. Ezzard Standing for bariatric surgery? Please advise.

## 2012-06-25 NOTE — Telephone Encounter (Signed)
All lab work for other causes of hepatitis were negative. He needs vaccinations against hepatitis A and B.

## 2012-06-25 NOTE — Telephone Encounter (Signed)
Pt is calling requesting his lab results. Please advise.

## 2012-06-26 ENCOUNTER — Telehealth: Payer: Self-pay

## 2012-06-26 ENCOUNTER — Ambulatory Visit (INDEPENDENT_AMBULATORY_CARE_PROVIDER_SITE_OTHER): Payer: BC Managed Care – PPO | Admitting: Gastroenterology

## 2012-06-26 DIAGNOSIS — Z23 Encounter for immunization: Secondary | ICD-10-CM

## 2012-06-26 NOTE — Telephone Encounter (Signed)
Spoke with pt and he is aware. Phone number given to pt to call and register for the seminar.

## 2012-06-26 NOTE — Telephone Encounter (Signed)
Message copied by Chrystie Nose on Tue Jun 26, 2012 12:44 PM ------      Message from: Marnette Burgess      Created: Tue Jun 26, 2012  8:25 AM      Regarding: RE: Referral       Patient has to go through the same process as any other patient for bariatric surgery.  The patient needs to call [phone] and register themselves for th free weight loss seminar that our facility offers.  The speakers at these seminars are our surgeons.  They will receive information on the different types of surgeries that we offer along with insurance information.  If they do decide to proceed they will let them know what to do next.  If you have any questions please call [phone].            Thank You,      Elane Fritz                  ----- Message -----         From: Lily Lovings, RN         Sent: 06/25/2012   4:18 PM           To: Marnette Burgess      Subject: Referral                                                 Pt needs appt with Dr. Ezzard Standing for bariatric surgery to treat fatty liver disease.             Thanks,      Selinda Michaels RN

## 2012-07-03 ENCOUNTER — Ambulatory Visit: Payer: BC Managed Care – PPO | Admitting: Gastroenterology

## 2012-07-03 DIAGNOSIS — Z23 Encounter for immunization: Secondary | ICD-10-CM

## 2012-07-27 ENCOUNTER — Other Ambulatory Visit: Payer: Self-pay | Admitting: Gastroenterology

## 2012-07-27 ENCOUNTER — Telehealth: Payer: Self-pay | Admitting: Pulmonary Disease

## 2012-07-27 ENCOUNTER — Ambulatory Visit (INDEPENDENT_AMBULATORY_CARE_PROVIDER_SITE_OTHER): Payer: BC Managed Care – PPO | Admitting: Gastroenterology

## 2012-07-27 DIAGNOSIS — K760 Fatty (change of) liver, not elsewhere classified: Secondary | ICD-10-CM

## 2012-07-27 DIAGNOSIS — K7689 Other specified diseases of liver: Secondary | ICD-10-CM

## 2012-07-27 DIAGNOSIS — Z23 Encounter for immunization: Secondary | ICD-10-CM

## 2012-07-27 NOTE — Telephone Encounter (Signed)
I don't see any forms scanned in pt chart. Do you remember seeing anything on this pt leigh. Please advise thanks

## 2012-07-27 NOTE — Telephone Encounter (Signed)
i did see these on SN cart and i believe that these were filled out and signed by SN and sent back.  They may have been sent to the scan center. thanks

## 2012-07-27 NOTE — Telephone Encounter (Signed)
I spoke with pt and he stated he will check with [facility] to see if they received anything. He will call back if not

## 2012-07-27 NOTE — Telephone Encounter (Signed)
[id number] x1 

## 2012-08-21 ENCOUNTER — Ambulatory Visit (INDEPENDENT_AMBULATORY_CARE_PROVIDER_SITE_OTHER): Payer: BC Managed Care – PPO | Admitting: Surgery

## 2012-08-21 ENCOUNTER — Encounter (INDEPENDENT_AMBULATORY_CARE_PROVIDER_SITE_OTHER): Payer: Self-pay | Admitting: Surgery

## 2012-08-21 NOTE — Progress Notes (Signed)
Re:   Paul Shannon DOB:   22-Dec-1965 MRN:   [id number]  ASSESSMENT AND PLAN: 1.  Morbid obesity - weight 228, BMI - 32.7    The patient attended our information session and reviewed the different types of bariatric surgery.    The patient is interested in the laparoscopic adjustable gastric band.  In view of his cirrhosis, this is probably his only option.  I discussed with the patient the indications and risks of lap band surgery.  The potential risks of surgery include, but are not limited to, bleeding, infection, DVT and PE, slippage and erosion of the band, open surgery, and death.  I also talked about the problem with varices and cirrhosis.  As far as he knows, he does not have these.The patient understands the importance of compliance and long term follow-up with our group after surgery.  She was given literature regarding lap band surgery.  From here we'll obtain x-rays, nutrition consult, and psych consult.  His labs are up to date.  The big question is whether BCBS will approve the surgery.   2.  Esophageal nodule - benign squamous papilloma. 3.  History of diverticulitis. 4.  Hypertension 5.  Seasonal allergies 6.  History of kidney stones. 7.  Questionable right inguinal hernia.  Does not really bother him. 8.  On thyroid replacement.  For about 10 years.  TSH - 0.19 - 06/12/2012. 9.  Liver with 70% steatosis changes. Biopsy done during his lap chole on 05/11/2012.  He has a NASH score of 8.  I gave him a copy of the path report.  He is to see Dr. Kriste Basque next week. 10.  Biliary dyskinesia - S/P lap chole - 05/11/2012 - D. Nadiyah Zeis 11.  Post cholecystectomy diarrhea.  Will wait and watch for now.  No chief complaint on file.  REFERRING PHYSICIAN:  Dr. Darrel Hoover  HISTORY OF PRESENT ILLNESS: Paul Shannon is a 46 y.o. (DOB: September 17, 1966)  white male whose primary care physician is [name],[name], MD and comes to me follow up of consideration of bariatric surgery.  He has been to our  bariatric seminar.  He understands the various options of weight loss surgery.  He has tried to diet, mainly limiting sugar intake and carbohydrate intake, and has had some modest success.  His father died in his 49's from cirrhosis, so this is driving him to consider weight loss surgery.  Much of the impetus for his interest in bariatric surgery is the relation of his weight to his liver disease.  He realizes the seriousness of progressive liver disease.  He said he has letters of support for bariatric surgery from Drs. Kriste Basque and Arlyce Dice.  AFP - 1.7 - 06/18/2012, Hepatitis panel - neg., T. Bili - 1.8 - 06/12/2012, ALT - 128 - 06/12/2012   Past Medical History  Diagnosis Date  . Hypertension   . Unspecified hypothyroidism   . Overweight   . Esophageal reflux   . Irritable bowel syndrome   . Unspecified hemorrhoids without mention of complication   . Anxiety state, unspecified   . Fatty liver     Dr Kinnie Scales   . Diverticulitis   . Arthritis   . History of kidney stones   . Kidney stone   . Biliary dyskinesia      Current Outpatient Prescriptions  Medication Sig Dispense Refill  . aspirin 81 MG tablet Take 81 mg by mouth daily with breakfast.       . Cyanocobalamin (VITAMIN  B 12 PO) Take 1 tablet by mouth daily.      [name] dicyclomine (BENTYL) 20 MG tablet Take 20 mg by mouth as needed.      [name] levothyroxine (SYNTHROID, LEVOTHROID) 175 MCG tablet Take 1 tablet (175 mcg total) by mouth daily.  90 tablet  3  . losartan-hydrochlorothiazide (HYZAAR) 100-25 MG per tablet Take 1 tablet by mouth daily.  90 tablet  3  . Multiple Vitamin (MULTIVITAMIN) capsule Take 1 capsule by mouth daily.        . potassium chloride SA (K-DUR,KLOR-CON) 20 MEQ tablet Take 1 tablet (20 mEq total) by mouth 2 (two) times daily.  180 tablet  3  . Probiotic Product (PROBIOTIC PO) Take 1 capsule by mouth daily with breakfast.         Allergies  Allergen Reactions  . Niacin     REACTION: pt states INTOL w/ flushing  . Other       Red dye=rash   . Penicillins     REACTION: childhood - extreme rash  . Rosuvastatin     REACTION: pt states INTOL   REVIEW OF SYSTEMS:  Cardiac:  Has history of hypertension since about 2005.  No history of seeing a cardiologist. Endocrine:  No diabetes. On thyroid replacement x 10 years. Gastrointestinal:  Liver biopsy with steatosis.  Colonoscopy in 2012 by Dr. Arlyce Dice. Urologic:  History of kidney stones.  Last attack 5 years ago. Musculoskeletal:  Right knee 2010 by Drs Murphy/Wainer.  SOCIAL and FAMILY HISTORY: Married.  2 children, ages 21 and 14. 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 Raised by grandparents.  PHYSICAL EXAM: BP 124/82  Pulse 64  Resp 16  Ht 5\' 10"  (1.778 m)  Wt 228 lb (103.42 kg)  BMI 32.71 kg/m2  General: WN WM who is alert and generally healthy appearing. Has a white goatee. HEENT: Normal. Pupils equal. Good dentition. Neck: Supple. No mass.  No thyroid mass.  Carotid pulse okay with no bruit. Lymph Nodes:  No supraclavicular or cervical nodes. Lungs: Clear to auscultation and symmetric breath sounds.  Tattoos of back and left shoulder. Heart:  RRR. No murmur or rub.  Abdomen: Soft. No mass. Mild epigastric tenderness. No hernia. Normal bowel sounds.  Well healed laparoscopic abdominal scars. Rectal: Not done. Extremities:  Good strength and ROM  in upper and lower extremities. Neurologic:  Grossly intact to motor and sensory function. Psychiatric: Has normal mood and affect. Behavior is normal.   DATA REVIEWED: Labs and chart.  Ovidio Kin, MD,  [facility] [address] [place]  [address]   [place], [place]    [address] Phone:  [phone] FAX:  [phone]

## 2012-08-30 ENCOUNTER — Encounter: Payer: BC Managed Care – PPO | Attending: Surgery | Admitting: *Deleted

## 2012-08-30 ENCOUNTER — Encounter: Payer: Self-pay | Admitting: *Deleted

## 2012-08-30 DIAGNOSIS — Z713 Dietary counseling and surveillance: Secondary | ICD-10-CM | POA: Insufficient documentation

## 2012-08-30 DIAGNOSIS — Z01818 Encounter for other preprocedural examination: Secondary | ICD-10-CM | POA: Insufficient documentation

## 2012-08-30 NOTE — Progress Notes (Signed)
  Pre-Op Assessment Visit:  Pre-Operative LAGB Surgery  Medical Nutrition Therapy:  Appt start time: 0800   End time:  0900.  Patient was seen on 08/30/2012 for Pre-Operative LAGB Nutrition Assessment. Assessment and letter of approval faxed to [facility] Bariatric Surgery Program coordinator on 08/30/2012.  Approval letter sent to [facility] center and will be available in the chart under the media tab.  Handouts given during visit include:  Pre-Op Goals   Bariatric Surgery Protein Shakes handout  Patient to call for Pre-Op and Post-Op Nutrition Education at [facility] when surgery is scheduled.

## 2012-08-30 NOTE — Patient Instructions (Addendum)
   Follow Pre-Op Nutrition Goals to prepare for Lapband Surgery.   Call the [facility] at [phone] once you have been given your surgery date to enrolled in the Pre-Op Nutrition Class. You will need to attend this nutrition class 3-4 weeks prior to your surgery. 

## 2012-09-05 ENCOUNTER — Telehealth: Payer: Self-pay | Admitting: Pulmonary Disease

## 2012-09-05 MED ORDER — AZITHROMYCIN 250 MG PO TABS: 250.0000 mg | ORAL_TABLET | ORAL | Status: DC

## 2012-09-05 NOTE — Telephone Encounter (Signed)
Spoke with patient, sent Zpak to patients pharmacy--[facility] [place], [place]  [address]. Pt aware.

## 2012-09-05 NOTE — Telephone Encounter (Signed)
Pt states that he believes he is getting another sinus infection(gets them twice a year); started having sinus pressure then drainage in throat last night; clear in color as of today but wants to get ahead of anything going on. Pt stated SN normally gives a Zpak to take care of this. Please advise. Thanks.   Pt also stated he started using Mucinex today.

## 2012-09-05 NOTE — Telephone Encounter (Signed)
Per SN---ok to give the pt zpak # 1  Take as directed with no refills.  thanks

## 2012-09-07 ENCOUNTER — Ambulatory Visit (HOSPITAL_COMMUNITY)
Admission: RE | Admit: 2012-09-07 | Discharge: 2012-09-07 | Disposition: A | Discharge status: Home / Self Care | Payer: BC Managed Care – PPO | Source: Ambulatory Visit | Attending: Surgery | Admitting: Surgery

## 2012-09-07 ENCOUNTER — Encounter (HOSPITAL_COMMUNITY)
Admission: RE | Disposition: A | Discharge status: Home / Self Care | Payer: Self-pay | Source: Ambulatory Visit | Attending: Surgery

## 2012-09-07 SURGERY — BREATH TEST, FOR HELICOBACTER PYLORI

## 2012-09-11 ENCOUNTER — Telehealth: Payer: Self-pay | Admitting: Pulmonary Disease

## 2012-09-11 NOTE — Telephone Encounter (Signed)
Spoke with pt. He states that SN rec that he proceed with having bariatric surgery, but he needs a letter clearing him for surgery and a letter of medical necessity also. He states that this was to have already been done and is upset nothing was done yet.  PN from 07/27/12 states that something was faxed to [place] [facility] approving him for surgery, but they state they never received anything and I do not see where anything was scanned in. Please advise thanks!

## 2012-09-12 ENCOUNTER — Encounter: Payer: Self-pay | Admitting: Pulmonary Disease

## 2012-09-13 MED ORDER — HYDROCOD POLST-CHLORPHEN POLST 10-8 MG/5ML PO LQCR: 5.0000 mL | Freq: Two times a day (BID) | ORAL | Status: DC

## 2012-09-13 NOTE — Telephone Encounter (Signed)
Pt calling again in ref to previous msg can be reached at [phone].Paul Shannon

## 2012-09-13 NOTE — Telephone Encounter (Signed)
Called and spoke with pt and per SN---ok to call in tussionex  #120 ml   1 tsp every 12 hours prn cough and pt should cont the mucinex along with plenty of fluids.   Pt is aware of rx called to the pharmacy and nothing further is needed.

## 2012-09-13 NOTE — Telephone Encounter (Signed)
Spoke with SN and he stated that the letter has been done and SN spoke with Dr. Ezzard Standing and Dr. Ezzard Standing is still reviewing information and will contact the pt.  Pt stated that he finished the zpak that was given last week.  He is still coughing with clear sputum now.  Requesting recs from SN.  Please advise. Thanks  Allergies  Allergen Reactions  . Niacin     REACTION: pt states INTOL w/ flushing  . Other     Red dye=rash   . Penicillins     REACTION: childhood - extreme rash  . Rosuvastatin     REACTION: pt states INTOL

## 2012-09-15 ENCOUNTER — Ambulatory Visit: Payer: BC Managed Care – PPO | Admitting: *Deleted

## 2012-09-17 ENCOUNTER — Telehealth (INDEPENDENT_AMBULATORY_CARE_PROVIDER_SITE_OTHER): Payer: Self-pay | Admitting: General Surgery

## 2012-09-17 NOTE — Telephone Encounter (Signed)
Calling status post sinus infection two weeks ago. He finished antibiotics a week ago but still has a small cough that is almost gone and has a little head congestion. He wanted to make sure it was okay to proceed with UGI tomorrow. I made him aware this should not be a problem. To call back with any other problems.

## 2012-09-18 ENCOUNTER — Ambulatory Visit (HOSPITAL_COMMUNITY)
Admission: RE | Admit: 2012-09-18 | Discharge: 2012-09-18 | Disposition: A | Discharge status: Home / Self Care | Payer: BC Managed Care – PPO | Source: Ambulatory Visit | Attending: Surgery | Admitting: Surgery

## 2012-09-18 DIAGNOSIS — I10 Essential (primary) hypertension: Secondary | ICD-10-CM | POA: Insufficient documentation

## 2012-09-18 DIAGNOSIS — Z6832 Body mass index (BMI) 32.0-32.9, adult: Secondary | ICD-10-CM | POA: Insufficient documentation

## 2012-09-18 DIAGNOSIS — E789 Disorder of lipoprotein metabolism, unspecified: Secondary | ICD-10-CM | POA: Insufficient documentation

## 2012-09-18 DIAGNOSIS — K589 Irritable bowel syndrome without diarrhea: Secondary | ICD-10-CM | POA: Insufficient documentation

## 2012-09-18 DIAGNOSIS — K7689 Other specified diseases of liver: Secondary | ICD-10-CM | POA: Insufficient documentation

## 2012-09-18 DIAGNOSIS — K229 Disease of esophagus, unspecified: Secondary | ICD-10-CM | POA: Insufficient documentation

## 2012-09-18 DIAGNOSIS — E039 Hypothyroidism, unspecified: Secondary | ICD-10-CM | POA: Insufficient documentation

## 2012-09-18 DIAGNOSIS — K219 Gastro-esophageal reflux disease without esophagitis: Secondary | ICD-10-CM | POA: Insufficient documentation

## 2012-10-02 ENCOUNTER — Encounter (HOSPITAL_COMMUNITY)
Admission: RE | Disposition: A | Discharge status: Home / Self Care | Payer: Self-pay | Source: Ambulatory Visit | Attending: Surgery

## 2012-10-02 ENCOUNTER — Ambulatory Visit (HOSPITAL_COMMUNITY)
Admission: RE | Admit: 2012-10-02 | Discharge: 2012-10-02 | Disposition: A | Discharge status: Home / Self Care | Payer: BC Managed Care – PPO | Source: Ambulatory Visit | Attending: Surgery | Admitting: Surgery

## 2012-10-02 HISTORY — PX: BREATH TEK H PYLORI: SHX5422

## 2012-10-02 SURGERY — BREATH TEST, FOR HELICOBACTER PYLORI

## 2012-10-03 ENCOUNTER — Encounter (HOSPITAL_COMMUNITY): Payer: Self-pay | Admitting: Surgery

## 2012-10-03 ENCOUNTER — Encounter (HOSPITAL_COMMUNITY): Payer: Self-pay

## 2012-10-03 ENCOUNTER — Telehealth: Payer: Self-pay | Admitting: Gastroenterology

## 2012-10-04 NOTE — Telephone Encounter (Signed)
Pt is trying to get approval from insurance company for lap-band surgery for his liver disease. Dr. Kriste Basque wrote a letter for the pt (see letter dated 09/12/12) supporting the procedure.   Pt is requesting a letter of medical necessity from Dr. Arlyce Dice also. Please advise.

## 2012-10-08 NOTE — Telephone Encounter (Signed)
Letter dictated.  Awaiting transcription

## 2012-10-10 NOTE — Letter (Signed)
October 08, 2012    RE:  IBRAHIMA, ZARZA MRN:  [id number]  /  DOB:  [birth year]  To Whom It May Concern,  Mr. Royel Borstad is under my care for hepatic steatosis and nonalcoholic steatohepatitis.  Recent liver biopsy demonstrated severe fibrosis with early bridging and nodule formation.  These findings place him at very high risk for developing cirrhosis.  The changes on liver biopsy are a result of his obesity.  The only proven therapy for nonalcoholic steatohepatitis is weight loss.  Mr. Schwoerer has been unsuccessful at losing weight and sustaining any type of significant weight loss. Accordingly, I have recommended that he undergo bariatric surgery which will provide him most reliable means of losing weight, and hopefully retarding the progression of his liver disease.  Unfortunately, there are no other medical therapies that have been proven effective at reversing the changes seen with steatohepatitis, short of profound weight loss.  Please feel free to contact me if you have any further questions.   Sincerely,     Barbette Hair. Arlyce Dice, MD,FACG   RDK/MedQ  DD: 10/08/2012  DT: 10/09/2012  Job #: [id number]

## 2012-10-15 NOTE — Telephone Encounter (Signed)
Letter mailed to pt.  

## 2012-10-16 ENCOUNTER — Telehealth: Payer: Self-pay | Admitting: Pulmonary Disease

## 2012-10-16 NOTE — Telephone Encounter (Signed)
Pt is requesting to speak with SN directly concerning bariatric surgery.  SN please advise. Thanks  Allergies  Allergen Reactions  . Niacin     REACTION: pt states INTOL w/ flushing  . Other     Red dye=rash   . Penicillins     REACTION: childhood - extreme rash  . Rosuvastatin     REACTION: pt states INTOL

## 2012-10-19 NOTE — Telephone Encounter (Signed)
SN called and spoke with pt directly

## 2012-10-22 ENCOUNTER — Encounter: Payer: Self-pay | Admitting: Pulmonary Disease

## 2012-12-12 HISTORY — PX: COLONOSCOPY: SHX174

## 2012-12-13 ENCOUNTER — Encounter: Payer: BC Managed Care – PPO | Attending: Surgery | Admitting: *Deleted

## 2012-12-13 DIAGNOSIS — Z713 Dietary counseling and surveillance: Secondary | ICD-10-CM | POA: Insufficient documentation

## 2012-12-13 DIAGNOSIS — Z01818 Encounter for other preprocedural examination: Secondary | ICD-10-CM | POA: Insufficient documentation

## 2012-12-13 NOTE — Progress Notes (Signed)
Bariatric Class:  Appt start time: 1730 end time:  1830.  Pre-Operative Nutrition Class  Patient was seen on 12/13/12 for Pre-Operative Bariatric Surgery Education at [facility].   Surgery date: 12/25/12 Surgery type: LAGB Start weight at [facility]: 227.0 lbs (08/30/12) Goal weight:   Weight today: 221.1 lb BMI: 31.7  Samples given per MNT protocol: Bariatric Advantage Multivitamin Lot # [id number]; Exp: 06/15  Celebrate Vitamins Multivitamin Lot # [id number]; Exp: 07/14  Celebrate Vitamins Calcium Citrate Lot # [id number]; Exp: 03/15  Opurity Sublingual B12 Lot # [id number]; Exp: 09/15  Opurity Band-Optimized MVI Lot # [id number]; Exp: 02/14  Corliss Marcus Protein powder Lot # [id number]; Exp: 02/15  Premier Protein shake Lot # [id number]; Exp: 10/19/13  The following the learning objective met by the patient during this course:  Identifies Pre-Op Dietary Goals and will begin 2 weeks pre-operatively  Identifies appropriate sources of fluids and proteins   States protein recommendations and appropriate sources pre and post-operatively  Identifies Post-Operative Dietary Goals and will follow for 2 weeks post-operatively  Identifies appropriate multivitamin and calcium sources  Describes the need for physical activity post-operatively and will follow MD recommendations  States when to call healthcare provider regarding medication questions or post-operative complications  Handouts given during class include:  Pre-Op Bariatric Surgery Diet Handout  Protein Shake Handout  Post-Op Bariatric Surgery Nutrition Handout  BELT Program Information Flyer  Support Group Information Flyer  [facility] Bariatric Supplements Price List  Follow-Up Plan: Patient will follow-up at [facility] 2 weeks post operatively for diet advancement per MD.

## 2012-12-14 ENCOUNTER — Encounter (INDEPENDENT_AMBULATORY_CARE_PROVIDER_SITE_OTHER): Payer: Self-pay | Admitting: Surgery

## 2012-12-14 ENCOUNTER — Encounter: Payer: Self-pay | Admitting: *Deleted

## 2012-12-14 ENCOUNTER — Ambulatory Visit (INDEPENDENT_AMBULATORY_CARE_PROVIDER_SITE_OTHER): Payer: BC Managed Care – PPO | Admitting: Surgery

## 2012-12-14 DIAGNOSIS — K7689 Other specified diseases of liver: Secondary | ICD-10-CM

## 2012-12-14 NOTE — Patient Instructions (Signed)
Follow:   Pre-Op Diet per MD 2 weeks prior to surgery  Phase 2- Liquids (clear/full) 2 weeks after surgery  Vitamin/Mineral/Calcium guidelines for purchasing bariatric supplements  Exercise guidelines pre and post-op per MD  Follow-up at [facility] in 2 weeks post-op for diet advancement. Contact Mairi Stagliano as needed with questions/concerns. 

## 2012-12-14 NOTE — Progress Notes (Addendum)
Re:   Paul Shannon DOB:   28-Apr-1966 MRN:   [id number]  ASSESSMENT AND PLAN: 1.  Morbid obesity - weight 228, BMI - 32.7    The patient attended our information session and reviewed the different types of bariatric surgery.    The patient is interested in weight loss surgery, in particular to help with his underlying liver disease.  The potential risks of weight loss surgery includes, but are not limited to, bleeding, infection, DVT and PE, slippage and erosion of a lap band, leak from any anastomosis, open surgery, and death.  I also talked about the potential problem with varices and his known cirrhosis. The patient understands the importance of compliance and long term follow-up with our group after surgery.  We had originally talked about lap band surgery for weight loss.  I told to him that Dr. Kriste Basque had expressed concern that the lap band may not provide enough weight loss and Dr. Kriste Basque was more in favor of the the gastric bypass.  One concern would be with Paul Shannon cirrhosis, there is probably an increased risks of leak at the staple line and anastomosis, but it would be hard for me to gauge how high that risk would be.    There are some papers that suggest bariatric surgery (usually gastric bypass) can improve steatohepatitis and fibrosis (Obes. Surg. 2012;22(7):1044-1049 and Clin. Gastroentero. and Hepatology 2008;6:1396-1402).  However, the Cochrane Database summary from Jan 2010 states "the lack of randomized clinical trials and quasi-randomized clinical studies precludes [place] to assess the benefits and harms of bariatric surgery as a therapeutic approach for patients with NASH."  So there is no clear evidence that one operation is better than the other or that weight loss will improve his liver disease.  I have discussed this uncertainty with Paul Shannon.  I have suggested that Paul Shannon talk to Dr. Kriste Basque about his concerns.  At this time, Paul Shannon is approved for the lap band.  If he wants  to change to the gastric bypass, we would have to contact BCBS.  We can work with him on this.  I discussed that a target weight for him would be around 175-180.  This would be right at a BMI of 25.  He thinks he may blow away.  We also talked about all the males in his family with liver disease and that there may be a genetic component that weight loss would not reverse.  I spent about 45 minutes going over the surgical options and possible scenarios.  [The patient called back to go ahead with the lap band.  I called and talked to him on the phone about 15 minutes.  He has contacted Dr. Jodelle Shannon office, but has not talked to Dr. Kriste Basque directly.  He said he has done a lot of research and thinks the lap band fits him better.  We will proceed with scheduling this.  He knows that if he changes his mind, we will work with him.  DN  12/18/2012]    2.  Esophageal nodule - benign squamous papilloma. 3.  History of diverticulitis. 4.  Hypertension 5.  Seasonal allergies 6.  History of kidney stones. 7.  Questionable right inguinal hernia.  Does not really bother him. 8.  On thyroid replacement.  For about 10 years.  TSH - 0.19 - 06/12/2012. 9.  Liver with 70% steatosis changes. Biopsy done during his lap chole on 05/11/2012.  He has a NASH score of 8.  His albumin was 4.1 on 06/12/2012.  His AST was 91 and ALT was 128 on 06/12/2012. 10.  Biliary dyskinesia - S/P lap chole - 05/11/2012 - D. Clemmie Marxen 11.  Post cholecystectomy diarrhea.  Will wait and watch for now.  Chief Complaint  Patient presents with  . Bariatric Pre-op   REFERRING PHYSICIAN:  Dr. Darrel Hoover  HISTORY OF PRESENT ILLNESS: Paul Shannon is a 47 y.o. (DOB: 11/13/1966)  white male whose primary care physician is [name],[name], MD and comes to me follow up of consideration of bariatric surgery.  His weight is down to 220, about 8 pounds since I saw him in 08/21/2012.  See A&P about our discussion and the options going forward. He has completed  our preoperative bariatric evaluation and is ready to go ahead with surgery.  Weight loss/Liver history: He understands the various options of weight loss surgery.  He has tried to diet, mainly limiting sugar intake and carbohydrate intake, and has had some modest success.  His father died in his 67's from cirrhosis, so this is driving him to consider weight loss surgery.  Much of the impetus for his interest in bariatric surgery is the relation of his weight to his liver disease.  He realizes the seriousness of progressive liver disease.  He said he has letters of support for bariatric surgery from Drs. Kriste Basque and Arlyce Dice.  AFP - 1.7 - 06/18/2012, Hepatitis panel - neg., T. Bili - 1.8 - 06/12/2012, ALT - 128 - 06/12/2012 UGI - 08/21/2012 - normal Psych - Dr. Milas Hock - 09/11/2012 He has been to the pre op nutrition class    Past Medical History  Diagnosis Date  . Hypertension   . Unspecified hypothyroidism   . Overweight   . Esophageal reflux   . Irritable bowel syndrome   . Unspecified hemorrhoids without mention of complication   . Anxiety state, unspecified   . Fatty liver     Dr Kinnie Scales   . Diverticulitis   . Arthritis   . History of kidney stones   . Kidney stone   . Biliary dyskinesia      Current Outpatient Prescriptions  Medication Sig Dispense Refill  . aspirin 81 MG tablet Take 81 mg by mouth daily with breakfast.       . azithromycin (ZITHROMAX) 250 MG tablet Take 1 tablet (250 mg total) by mouth as directed.  6 tablet  0  . chlorpheniramine-HYDROcodone (TUSSIONEX PENNKINETIC ER) 10-8 MG/5ML LQCR Take 5 mLs by mouth every 12 (twelve) hours.  120 mL  0  . Cyanocobalamin (VITAMIN B 12 PO) Take 1 tablet by mouth daily.      [name] dicyclomine (BENTYL) 20 MG tablet Take 20 mg by mouth as needed.      [name] levothyroxine (SYNTHROID, LEVOTHROID) 175 MCG tablet Take 1 tablet (175 mcg total) by mouth daily.  90 tablet  3  . losartan-hydrochlorothiazide (HYZAAR) 100-25 MG per tablet Take 1  tablet by mouth daily.  90 tablet  3  . Multiple Vitamin (MULTIVITAMIN) capsule Take 1 capsule by mouth daily.        . potassium chloride SA (K-DUR,KLOR-CON) 20 MEQ tablet Take 1 tablet (20 mEq total) by mouth 2 (two) times daily.  180 tablet  3  . Probiotic Product (PROBIOTIC PO) Take 1 capsule by mouth daily with breakfast.         Allergies  Allergen Reactions  . Niacin     REACTION: pt states INTOL w/ flushing  .  Other     Red dye=rash   . Penicillins     REACTION: childhood - extreme rash  . Rosuvastatin     REACTION: pt states INTOL   REVIEW OF SYSTEMS:  Cardiac:  Has history of hypertension since about 2005.  No history of seeing a cardiologist. Endocrine:  No diabetes. On thyroid replacement x 10 years. Gastrointestinal:  Liver biopsy with steatosis.  Colonoscopy in 2012 by Dr. Arlyce Dice. Urologic:  History of kidney stones.  Last attack 5 years ago. Musculoskeletal:  Right knee 2010 by Drs Murphy/Wainer.  SOCIAL and FAMILY HISTORY: Married.  2 children, ages 76 and 6. 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  PHYSICAL EXAM: BP 120/81  Pulse 82  Temp 98.4 F (36.9 C) (Temporal)  Resp 14  Ht 5\' 10"  (1.778 m)  Wt 220 lb (99.791 kg)  BMI 31.57 kg/m2  General: WN WM who is alert and generally healthy appearing. Has a white goatee. HEENT: Normal. Pupils equal. Good dentition. Neck: Supple. No mass.  No thyroid mass.  Carotid pulse okay with no bruit. Lymph Nodes:  No supraclavicular or cervical nodes. Lungs: Clear to auscultation and symmetric breath sounds.  Tattoos of back and left shoulder. Heart:  RRR. No murmur or rub.  Abdomen: Soft. No mass.  No hernia. Normal bowel sounds.  Well healed laparoscopic abdominal scars. Extremities:  Good strength and ROM  in upper and lower extremities. Neurologic:  Grossly intact to motor and sensory function. Psychiatric: Has normal mood and affect. Behavior is normal.   DATA REVIEWED: Labs  and chart.  Ovidio Kin, MD,  [facility] [address] [place]  [address]   [place], [place]    [address] Phone:  [phone] FAX:  [phone]

## 2012-12-17 ENCOUNTER — Telehealth: Payer: Self-pay | Admitting: Pulmonary Disease

## 2012-12-17 NOTE — Telephone Encounter (Signed)
I spoke with the pt and he states he is scheduled to have lap band surgery done on 12-25-12. He states he had an appt with Dr. Ezzard Standing on 12-14-12 for one last f/u before surgery and Dr. Ezzard Standing spoke with him about Dr. Kirstie Mirza concerns about the pt having lap band surgery and he advised the pt to speak with Dr. Kriste Basque about this as soon as possible. Pt has been approved for lap band and if a change was made this would have to be submitted to his insurance before procedure could be done. Pt is requesting to speak to Dr. Kriste Basque about this asap.   Also the states surgeon advised him to discuss his meds with Dr. Kriste Basque. He is currently taking Hyzaar, potassium, and levothyroxine. He states his tablets either have to be able to be crushed, or be changed to liquid form. Especially the potassium and levo because they are large pills. Please advise if these meds can be crushed or if there are liquid alternatives. Thanks. Carron Curie, CMA

## 2012-12-18 ENCOUNTER — Encounter (INDEPENDENT_AMBULATORY_CARE_PROVIDER_SITE_OTHER): Payer: Self-pay

## 2012-12-18 ENCOUNTER — Other Ambulatory Visit (INDEPENDENT_AMBULATORY_CARE_PROVIDER_SITE_OTHER): Payer: Self-pay | Admitting: Surgery

## 2012-12-19 ENCOUNTER — Encounter (HOSPITAL_COMMUNITY): Payer: Self-pay | Admitting: Pharmacy Technician

## 2012-12-20 NOTE — Telephone Encounter (Signed)
SN will call the pt

## 2012-12-21 ENCOUNTER — Encounter (HOSPITAL_COMMUNITY): Payer: Self-pay

## 2012-12-21 ENCOUNTER — Other Ambulatory Visit (HOSPITAL_COMMUNITY): Payer: Self-pay | Admitting: Surgery

## 2012-12-21 ENCOUNTER — Encounter (HOSPITAL_COMMUNITY)
Admission: RE | Admit: 2012-12-21 | Discharge: 2012-12-21 | Disposition: A | Discharge status: Home / Self Care | Payer: BC Managed Care – PPO | Source: Ambulatory Visit | Attending: Surgery | Admitting: Surgery

## 2012-12-21 ENCOUNTER — Telehealth (INDEPENDENT_AMBULATORY_CARE_PROVIDER_SITE_OTHER): Payer: Self-pay | Admitting: General Surgery

## 2012-12-21 LAB — COMPREHENSIVE METABOLIC PANEL
ALT: 72 U/L — ABNORMAL HIGH (ref 0–53)
AST: 47 U/L — ABNORMAL HIGH (ref 0–37)
Alkaline Phosphatase: 63 U/L (ref 39–117)
CO2: 28 mEq/L (ref 19–32)
Chloride: 97 mEq/L (ref 96–112)
Creatinine, Ser: 0.86 mg/dL (ref 0.50–1.35)
GFR calc non Af Amer: >90 mL/min (ref >90)
Potassium: 2.9 mEq/L — ABNORMAL LOW (ref 3.5–5.1)
Total Bilirubin: 1.7 mg/dL — ABNORMAL HIGH (ref 0.3–1.2)

## 2012-12-21 LAB — CBC
MCV: 88.4 fL (ref 78.0–100.0)
Platelets: 238 10*3/uL (ref 150–400)
RBC: 4.92 MIL/uL (ref 4.22–5.81)
WBC: 8 10*3/uL (ref 4.0–10.5)

## 2012-12-21 NOTE — Patient Instructions (Addendum)
20 ARKIN IMRAN  12/21/2012   Your procedure is scheduled on: 12-25-2012   Report to [facility] at 1000 AM.  Call this number if you have problems the morning of surgery [phone]   Remember:   Do not eat food or drink liquids :After Midnight.     Take these medicines the morning of surgery with A SIP OF WATER: synthroid   Do not wear jewelry, make-up or nail polish.  Do not wear lotions, powders, or perfumes. You may wear deodorant.  Do not shave 48 hours prior to surgery. Men may shave face and neck.  Do not bring valuables to the hospital.  Contacts, dentures or bridgework may not be worn into surgery.  Leave suitcase in the car. After surgery it may be brought to your room.  For patients admitted to the hospital, checkout time is 11:00 AM the day of discharge.   Patients discharged the day of surgery will not be allowed to drive home.  Name and phone number of your driver:  Special Instructions: N/A   Please read over the following fact sheets that you were given: MRSA Information.  Call Cain Sieve RN pre op nurse if needed [phone]    FAILURE TO FOLLOW THESE INSTRUCTIONS MAY RESULT IN THE CANCELLATION OF YOUR SURGERY. PATIENT SIGNATURE___________________________________________

## 2012-12-21 NOTE — Progress Notes (Signed)
ORDERED POTASSIUM RECHECK  DAY OF SURGERY PER ANESTHESIA

## 2012-12-21 NOTE — Progress Notes (Addendum)
CALLED AND SPOKE WITH BARABARA PENNY RN AND MADE AWARE K 2.0 ON TODAY LABS. DR NEWMAN IA OFF AND BARABRA WILL CALL DR PT PCP DR [name] AND MAKE AWARE K 2.9 TODAY

## 2012-12-21 NOTE — Telephone Encounter (Signed)
Pt's pre-op K+ is 2.9; paged and updated (LDOW) Dr. Biagio Quint.  Agreed to order KCl for 1 month supply only, as this will need permanent mgmt by PCP.  Called pt to confirm pharmacy; he expressed concern for taking large pills after lap band surgery.  Discussed opening capsule and dissolving powder in fluid to take.  Called [facility]:  [phone] to arrange with pharmacist.  Left verbal order with her for KCl 20 mEq BID x 1 month only.  Pt is aware to pick up.

## 2012-12-21 NOTE — Progress Notes (Signed)
05-02-12 chest 2 view epic 09-18-12 ekg epic

## 2012-12-24 ENCOUNTER — Telehealth (INDEPENDENT_AMBULATORY_CARE_PROVIDER_SITE_OTHER): Payer: Self-pay

## 2012-12-24 NOTE — Telephone Encounter (Signed)
Called pt to give him his P/O appt . Patient states he would like to have a liver Biopsy along with his Gastric bypass that is scheduled 12/25/2012

## 2012-12-25 ENCOUNTER — Ambulatory Visit (HOSPITAL_COMMUNITY): Payer: BC Managed Care – PPO | Admitting: Anesthesiology

## 2012-12-25 ENCOUNTER — Observation Stay (HOSPITAL_COMMUNITY)
Admission: RE | Admit: 2012-12-25 | Discharge: 2012-12-27 | Disposition: A | Discharge status: Home / Self Care | Payer: BC Managed Care – PPO | Source: Ambulatory Visit | Attending: Surgery | Admitting: Surgery

## 2012-12-25 ENCOUNTER — Encounter (HOSPITAL_COMMUNITY)
Admission: RE | Disposition: A | Discharge status: Home / Self Care | Payer: Self-pay | Source: Ambulatory Visit | Attending: Surgery

## 2012-12-25 ENCOUNTER — Encounter (HOSPITAL_COMMUNITY): Payer: Self-pay | Admitting: Anesthesiology

## 2012-12-25 ENCOUNTER — Encounter (HOSPITAL_COMMUNITY): Payer: Self-pay | Admitting: *Deleted

## 2012-12-25 DIAGNOSIS — E039 Hypothyroidism, unspecified: Secondary | ICD-10-CM

## 2012-12-25 DIAGNOSIS — R109 Unspecified abdominal pain: Secondary | ICD-10-CM | POA: Insufficient documentation

## 2012-12-25 DIAGNOSIS — I10 Essential (primary) hypertension: Secondary | ICD-10-CM | POA: Insufficient documentation

## 2012-12-25 DIAGNOSIS — K7689 Other specified diseases of liver: Secondary | ICD-10-CM

## 2012-12-25 DIAGNOSIS — R141 Gas pain: Secondary | ICD-10-CM | POA: Insufficient documentation

## 2012-12-25 DIAGNOSIS — R142 Eructation: Secondary | ICD-10-CM | POA: Insufficient documentation

## 2012-12-25 DIAGNOSIS — Z01812 Encounter for preprocedural laboratory examination: Secondary | ICD-10-CM | POA: Insufficient documentation

## 2012-12-25 DIAGNOSIS — Z79899 Other long term (current) drug therapy: Secondary | ICD-10-CM | POA: Insufficient documentation

## 2012-12-25 DIAGNOSIS — Z6831 Body mass index (BMI) 31.0-31.9, adult: Secondary | ICD-10-CM

## 2012-12-25 DIAGNOSIS — Z6833 Body mass index (BMI) 33.0-33.9, adult: Secondary | ICD-10-CM | POA: Insufficient documentation

## 2012-12-25 DIAGNOSIS — D130 Benign neoplasm of esophagus: Secondary | ICD-10-CM | POA: Insufficient documentation

## 2012-12-25 HISTORY — PX: LAPAROSCOPIC GASTRIC BANDING: SHX1100

## 2012-12-25 HISTORY — PX: MESH APPLIED TO LAP PORT: SHX5969

## 2012-12-25 HISTORY — PX: LIVER BIOPSY: SHX301

## 2012-12-25 SURGERY — GASTRIC BANDING, LAPAROSCOPIC
Anesthesia: General | Site: Abdomen | Wound class: Clean

## 2012-12-25 MED ORDER — PROPOFOL 10 MG/ML IV BOLUS
INTRAVENOUS | Status: DC | PRN
  Administered 2012-12-25: 200 mg via INTRAVENOUS

## 2012-12-25 MED ORDER — BUPIVACAINE HCL (PF) 0.25 % IJ SOLN
INTRAMUSCULAR | Status: DC | PRN
  Administered 2012-12-25: 30 mL

## 2012-12-25 MED ORDER — CIPROFLOXACIN IN D5W 400 MG/200ML IV SOLN
400.0000 mg | INTRAVENOUS | Status: AC
  Administered 2012-12-25: 400 mg via INTRAVENOUS

## 2012-12-25 MED ORDER — MEPERIDINE HCL 50 MG/ML IJ SOLN: 6.2500 mg | INTRAMUSCULAR | Status: DC | PRN

## 2012-12-25 MED ORDER — SUCCINYLCHOLINE CHLORIDE 20 MG/ML IJ SOLN
INTRAMUSCULAR | Status: DC | PRN
  Administered 2012-12-25: 160 mg via INTRAVENOUS

## 2012-12-25 MED ORDER — PHENYLEPHRINE HCL 10 MG/ML IJ SOLN
INTRAMUSCULAR | Status: DC | PRN
  Administered 2012-12-25: 40 ug via INTRAVENOUS

## 2012-12-25 MED ORDER — ROCURONIUM BROMIDE 100 MG/10ML IV SOLN
INTRAVENOUS | Status: DC | PRN
  Administered 2012-12-25: 35 mg via INTRAVENOUS
  Administered 2012-12-25: 5 mg via INTRAVENOUS

## 2012-12-25 MED ORDER — UNJURY CHICKEN SOUP POWDER: 2.0000 [oz_av] | Freq: Four times a day (QID) | ORAL | Status: DC

## 2012-12-25 MED ORDER — LACTATED RINGERS IV SOLN
INTRAVENOUS | Status: DC
  Administered 2012-12-25 (×2): via INTRAVENOUS
  Administered 2012-12-25: 1000 mL via INTRAVENOUS

## 2012-12-25 MED ORDER — PROMETHAZINE HCL 25 MG/ML IJ SOLN: 6.2500 mg | INTRAMUSCULAR | Status: DC | PRN

## 2012-12-25 MED ORDER — SODIUM CHLORIDE 0.9 % IJ SOLN
INTRAMUSCULAR | Status: DC | PRN
  Administered 2012-12-25: 20 mL via INTRAVENOUS

## 2012-12-25 MED ORDER — EPHEDRINE SULFATE 50 MG/ML IJ SOLN
INTRAMUSCULAR | Status: DC | PRN
  Administered 2012-12-25 (×2): 5 mg via INTRAVENOUS

## 2012-12-25 MED ORDER — LACTATED RINGERS IV SOLN: INTRAVENOUS | Status: DC

## 2012-12-25 MED ORDER — MIDAZOLAM HCL 5 MG/5ML IJ SOLN
INTRAMUSCULAR | Status: DC | PRN
  Administered 2012-12-25: 2 mg via INTRAVENOUS

## 2012-12-25 MED ORDER — ONDANSETRON HCL 4 MG/2ML IJ SOLN
4.0000 mg | INTRAMUSCULAR | Status: DC | PRN
  Administered 2012-12-25 – 2012-12-26 (×3): 4 mg via INTRAVENOUS
  Filled 2012-12-25 (×3): qty 2

## 2012-12-25 MED ORDER — MORPHINE SULFATE 2 MG/ML IJ SOLN
2.0000 mg | INTRAMUSCULAR | Status: DC | PRN
  Administered 2012-12-25 – 2012-12-26 (×11): 4 mg via INTRAVENOUS
  Filled 2012-12-25 (×11): qty 2

## 2012-12-25 MED ORDER — UNJURY VANILLA POWDER: 2.0000 [oz_av] | Freq: Four times a day (QID) | ORAL | Status: DC

## 2012-12-25 MED ORDER — HEPARIN SODIUM (PORCINE) 5000 UNIT/ML IJ SOLN
5000.0000 [IU] | Freq: Three times a day (TID) | INTRAMUSCULAR | Status: DC
  Administered 2012-12-25 – 2012-12-27 (×5): 5000 [IU] via SUBCUTANEOUS
  Filled 2012-12-25 (×8): qty 1

## 2012-12-25 MED ORDER — HEPARIN SODIUM (PORCINE) 5000 UNIT/ML IJ SOLN
5000.0000 [IU] | Freq: Once | INTRAMUSCULAR | Status: AC
  Administered 2012-12-25: 5000 [IU] via SUBCUTANEOUS
  Filled 2012-12-25: qty 1

## 2012-12-25 MED ORDER — HYDROMORPHONE HCL PF 1 MG/ML IJ SOLN
0.2500 mg | INTRAMUSCULAR | Status: DC | PRN
  Administered 2012-12-25 (×2): 0.5 mg via INTRAVENOUS

## 2012-12-25 MED ORDER — OXYCODONE HCL 5 MG/5ML PO SOLN
5.0000 mg | ORAL | Status: DC | PRN
  Administered 2012-12-26 (×2): 5 mg via ORAL
  Filled 2012-12-25 (×3): qty 5

## 2012-12-25 MED ORDER — LIDOCAINE HCL (CARDIAC) 20 MG/ML IV SOLN
INTRAVENOUS | Status: DC | PRN
  Administered 2012-12-25: 50 mg via INTRAVENOUS

## 2012-12-25 MED ORDER — UNJURY CHOCOLATE CLASSIC POWDER
2.0000 [oz_av] | Freq: Four times a day (QID) | ORAL | Status: DC
  Administered 2012-12-26 (×2): 2 [oz_av] via ORAL

## 2012-12-25 MED ORDER — POTASSIUM CHLORIDE IN NACL 20-0.45 MEQ/L-% IV SOLN
INTRAVENOUS | Status: DC
  Administered 2012-12-25 – 2012-12-26 (×4): via INTRAVENOUS
  Filled 2012-12-25 (×5): qty 1000

## 2012-12-25 MED ORDER — FENTANYL CITRATE 0.05 MG/ML IJ SOLN
INTRAMUSCULAR | Status: DC | PRN
  Administered 2012-12-25: 50 ug via INTRAVENOUS
  Administered 2012-12-25: 100 ug via INTRAVENOUS
  Administered 2012-12-25: 50 ug via INTRAVENOUS

## 2012-12-25 SURGICAL SUPPLY — 63 items
BAND LAP 10.0 W/TUBES (Band) ×3 IMPLANT
BENZOIN TINCTURE PRP APPL 2/3 (GAUZE/BANDAGES/DRESSINGS) IMPLANT
BLADE HEX COATED 2.75 (ELECTRODE) IMPLANT
BLADE SURG 15 STRL LF DISP TIS (BLADE) ×2 IMPLANT
BLADE SURG 15 STRL SS (BLADE) ×1
CANISTER SUCTION 2500CC (MISCELLANEOUS) IMPLANT
CHLORAPREP W/TINT 26ML (MISCELLANEOUS) ×3 IMPLANT
CLOTH BEACON ORANGE TIMEOUT ST (SAFETY) ×3 IMPLANT
DECANTER SPIKE VIAL GLASS SM (MISCELLANEOUS) ×3 IMPLANT
DERMABOND ADVANCED (GAUZE/BANDAGES/DRESSINGS) ×1
DERMABOND ADVANCED .7 DNX12 (GAUZE/BANDAGES/DRESSINGS) ×2 IMPLANT
DEVICE SUT QUICK LOAD TK 5 (STAPLE) ×9 IMPLANT
DEVICE SUT TI-KNOT TK 5X26 (MISCELLANEOUS) ×3 IMPLANT
DEVICE SUTURE ENDOST 10MM (ENDOMECHANICALS) IMPLANT
DISSECTOR BLUNT TIP ENDO 5MM (MISCELLANEOUS) IMPLANT
DRAPE CAMERA CLOSED 9X96 (DRAPES) ×3 IMPLANT
ELECT REM PT RETURN 9FT ADLT (ELECTROSURGICAL) ×3
ELECTRODE REM PT RTRN 9FT ADLT (ELECTROSURGICAL) ×2 IMPLANT
GLOVE BIOGEL PI IND STRL 7.0 (GLOVE) ×2 IMPLANT
GLOVE BIOGEL PI INDICATOR 7.0 (GLOVE) ×1
GLOVE SURG SIGNA 7.5 PF LTX (GLOVE) ×3 IMPLANT
GOWN STRL NON-REIN LRG LVL3 (GOWN DISPOSABLE) ×3 IMPLANT
GOWN STRL REIN XL XLG (GOWN DISPOSABLE) ×15 IMPLANT
HOVERMATT SINGLE USE (MISCELLANEOUS) ×3 IMPLANT
KIT BASIN OR (CUSTOM PROCEDURE TRAY) ×3 IMPLANT
MESH HERNIA 1X4 RECT BARD (Mesh General) ×2 IMPLANT
MESH HERNIA BARD 1X4 (Mesh General) ×1 IMPLANT
NEEDLE BIOPSY 14X6 SOFT TISS (NEEDLE) ×3 IMPLANT
NEEDLE SPNL 22GX3.5 QUINCKE BK (NEEDLE) ×3 IMPLANT
NS IRRIG 1000ML POUR BTL (IV SOLUTION) ×3 IMPLANT
PACK UNIVERSAL I (CUSTOM PROCEDURE TRAY) ×3 IMPLANT
PAD TELFA 2X3 NADH STRL (GAUZE/BANDAGES/DRESSINGS) ×3 IMPLANT
PENCIL BUTTON HOLSTER BLD 10FT (ELECTRODE) ×3 IMPLANT
SCALPEL HARMONIC ACE (MISCELLANEOUS) IMPLANT
SET IRRIG TUBING LAPAROSCOPIC (IRRIGATION / IRRIGATOR) IMPLANT
SLEEVE XCEL OPT CAN 5 100 (ENDOMECHANICALS) ×3 IMPLANT
SLEEVE Z-THREAD 5X100MM (TROCAR) IMPLANT
SOLUTION ANTI FOG 6CC (MISCELLANEOUS) ×3 IMPLANT
SPONGE LAP 18X18 X RAY DECT (DISPOSABLE) ×3 IMPLANT
STAPLER VISISTAT 35W (STAPLE) ×3 IMPLANT
STRIP CLOSURE SKIN 1/2X4 (GAUZE/BANDAGES/DRESSINGS) IMPLANT
SUT ETHIBOND 2 0 SH (SUTURE) ×3
SUT ETHIBOND 2 0 SH 36X2 (SUTURE) ×6 IMPLANT
SUT PROLENE 2 0 CT2 30 (SUTURE) ×3 IMPLANT
SUT SILK 0 (SUTURE) ×1
SUT SILK 0 30XBRD TIE 6 (SUTURE) ×2 IMPLANT
SUT SURGIDAC NAB ES-9 0 48 120 (SUTURE) IMPLANT
SUT VIC AB 2-0 SH 27 (SUTURE) ×1
SUT VIC AB 2-0 SH 27X BRD (SUTURE) ×2 IMPLANT
SUT VIC AB 5-0 PS2 18 (SUTURE) ×3 IMPLANT
SYR 20CC LL (SYRINGE) ×3 IMPLANT
SYR CONTROL 10ML LL (SYRINGE) ×3 IMPLANT
SYS KII OPTICAL ACCESS 15MM (TROCAR)
SYSTEM KII OPTICAL ACCESS 15MM (TROCAR) IMPLANT
TOWEL OR 17X26 10 PK STRL BLUE (TOWEL DISPOSABLE) ×6 IMPLANT
TROCAR BLADELESS 15MM (ENDOMECHANICALS) ×3 IMPLANT
TROCAR BLADELESS OPT 5 100 (ENDOMECHANICALS) ×3 IMPLANT
TROCAR XCEL NON-BLD 11X100MML (ENDOMECHANICALS) ×3 IMPLANT
TROCAR Z-THREAD FIOS 11X100 BL (TROCAR) IMPLANT
TROCAR Z-THREAD FIOS 5X100MM (TROCAR) IMPLANT
TROCAR Z-THREAD SLEEVE 11X100 (TROCAR) IMPLANT
TUBE CALIBRATION LAPBAND (TUBING) ×3 IMPLANT
TUBING INSUFFLATION 10FT LAP (TUBING) ×3 IMPLANT

## 2012-12-25 NOTE — H&P (View-Only) (Signed)
Re:   Paul Shannon DOB:   03/27/1966 MRN:   [id number]  ASSESSMENT AND PLAN: 1.  Morbid obesity - weight 228, BMI - 32.7    The patient attended our information session and reviewed the different types of bariatric surgery.    The patient is interested in weight loss surgery, in particular to help with his underlying liver disease.  The potential risks of weight loss surgery includes, but are not limited to, bleeding, infection, DVT and PE, slippage and erosion of a lap band, leak from any anastomosis, open surgery, and death.  I also talked about the potential problem with varices and his known cirrhosis. The patient understands the importance of compliance and long term follow-up with our group after surgery.  We had originally talked about lap band surgery for weight loss.  I told to him that Dr. [name] had expressed concern that the lap band may not provide enough weight loss and Dr. [name] was more in favor of the the gastric bypass.  One concern would be with Mr. Mucha's cirrhosis, there is probably an increased risks of leak at the staple line and anastomosis, but it would be hard for me to gauge how high that risk would be.    There are some papers that suggest bariatric surgery (usually gastric bypass) can improve steatohepatitis and fibrosis (Obes. Surg. 2012;22(7):1044-1049 and Clin. Gastroentero. and Hepatology 2008;6:1396-1402).  However, the Cochrane Database summary from Jan 2010 states "the lack of randomized clinical trials and quasi-randomized clinical studies precludes us to assess the benefits and harms of bariatric surgery as a therapeutic approach for patients with NASH."  So there is no clear evidence that one operation is better than the other or that weight loss will improve his liver disease.  I have discussed this uncertainty with Mr. Gipe.  I have suggested that Mr. Grzywacz talk to Dr. [name] about his concerns.  At this time, Mr. Casebier is approved for the lap band.  If he wants  to change to the gastric bypass, we would have to contact [employer].  We can work with him on this.  I discussed that a target weight for him would be around 175-180.  This would be right at a BMI of 25.  He thinks he may blow away.  We also talked about all the males in his family with liver disease and that there may be a genetic component that weight loss would not reverse.  I spent about 45 minutes going over the surgical options and possible scenarios.  [The patient called back to go ahead with the lap band.  I called and talked to him on the phone about 15 minutes.  He has contacted Dr. [name]'s office, but has not talked to Dr. [name] directly.  He said he has done a lot of research and thinks the lap band fits him better.  We will proceed with scheduling this.  He knows that if he changes his mind, we will work with him.  DN  12/18/2012]    2.  Esophageal nodule - benign squamous papilloma. 3.  History of diverticulitis. 4.  Hypertension 5.  Seasonal allergies 6.  History of kidney stones. 7.  Questionable right inguinal hernia.  Does not really bother him. 8.  On thyroid replacement.  For about 10 years.  TSH - 0.19 - 06/12/2012. 9.  Liver with 70% steatosis changes. Biopsy done during his lap chole on 05/11/2012.  He has a NASH score of 8.     His albumin was 4.1 on 06/12/2012.  His AST was 91 and ALT was 128 on 06/12/2012. 10.  Biliary dyskinesia - S/P lap chole - 05/11/2012 - D. Dishawn Bhargava 11.  Post cholecystectomy diarrhea.  Will wait and watch for now.  Chief Complaint  Patient presents with  . Bariatric Pre-op   REFERRING PHYSICIAN:  Dr. R. Kaplan  HISTORY OF PRESENT ILLNESS: Paul Shannon is a 47 y.o. (DOB: 10/12/1966)  white male whose primary care physician is [name],[name], MD and comes to me follow up of consideration of bariatric surgery.  His weight is down to 220, about 8 pounds since I saw him in 08/21/2012.  See A&P about our discussion and the options going forward. He has completed  our preoperative bariatric evaluation and is ready to go ahead with surgery.  Weight loss/Liver history: He understands the various options of weight loss surgery.  He has tried to diet, mainly limiting sugar intake and carbohydrate intake, and has had some modest success.  His father died in his 50's from cirrhosis, so this is driving him to consider weight loss surgery.  Much of the impetus for his interest in bariatric surgery is the relation of his weight to his liver disease.  He realizes the seriousness of progressive liver disease.  He said he has letters of support for bariatric surgery from Drs. [name] and Kaplan.  AFP - 1.7 - 06/18/2012, Hepatitis panel - neg., T. Bili - 1.8 - 06/12/2012, ALT - 128 - 06/12/2012 UGI - 08/21/2012 - normal Psych - Dr. S. Cunningham - 09/11/2012 He has been to the pre op nutrition class    Past Medical History  Diagnosis Date  . Hypertension   . Unspecified hypothyroidism   . Overweight   . Esophageal reflux   . Irritable bowel syndrome   . Unspecified hemorrhoids without mention of complication   . Anxiety state, unspecified   . Fatty liver     Dr Medoff   . Diverticulitis   . Arthritis   . History of kidney stones   . Kidney stone   . Biliary dyskinesia      Current Outpatient Prescriptions  Medication Sig Dispense Refill  . aspirin 81 MG tablet Take 81 mg by mouth daily with breakfast.       . azithromycin (ZITHROMAX) 250 MG tablet Take 1 tablet (250 mg total) by mouth as directed.  6 tablet  0  . chlorpheniramine-HYDROcodone (TUSSIONEX PENNKINETIC ER) 10-8 MG/5ML LQCR Take 5 mLs by mouth every 12 (twelve) hours.  120 mL  0  . Cyanocobalamin (VITAMIN B 12 PO) Take 1 tablet by mouth daily.      . dicyclomine (BENTYL) 20 MG tablet Take 20 mg by mouth as needed.      . levothyroxine (SYNTHROID, LEVOTHROID) 175 MCG tablet Take 1 tablet (175 mcg total) by mouth daily.  90 tablet  3  . losartan-hydrochlorothiazide (HYZAAR) 100-25 MG per tablet Take 1  tablet by mouth daily.  90 tablet  3  . Multiple Vitamin (MULTIVITAMIN) capsule Take 1 capsule by mouth daily.        . potassium chloride SA (K-DUR,KLOR-CON) 20 MEQ tablet Take 1 tablet (20 mEq total) by mouth 2 (two) times daily.  180 tablet  3  . Probiotic Product (PROBIOTIC PO) Take 1 capsule by mouth daily with breakfast.         Allergies  Allergen Reactions  . Niacin     REACTION: pt states INTOL w/ flushing  .   Other     Red dye=rash   . Penicillins     REACTION: childhood - extreme rash  . Rosuvastatin     REACTION: pt states INTOL   REVIEW OF SYSTEMS:  Cardiac:  Has history of hypertension since about 2005.  No history of seeing a cardiologist. Endocrine:  No diabetes. On thyroid replacement x 10 years. Gastrointestinal:  Liver biopsy with steatosis.  Colonoscopy in 2012 by Dr. Kaplan. Urologic:  History of kidney stones.  Last attack 5 years ago. Musculoskeletal:  Right knee 2010 by Drs Murphy/Wainer.  SOCIAL and FAMILY HISTORY: Married.  2 children, ages 20 and 9. Works as field operator for [employer] Raised by grandparents.  PHYSICAL EXAM: BP 120/81  Pulse 82  Temp 98.4 F (36.9 C) (Temporal)  Resp 14  Ht 5' 10" (1.778 m)  Wt 220 lb (99.791 kg)  BMI 31.57 kg/m2  General: WN WM who is alert and generally healthy appearing. Has a white goatee. HEENT: Normal. Pupils equal. Good dentition. Neck: Supple. No mass.  No thyroid mass.  Carotid pulse okay with no bruit. Lymph Nodes:  No supraclavicular or cervical nodes. Lungs: Clear to auscultation and symmetric breath sounds.  Tattoos of back and left shoulder. Heart:  RRR. No murmur or rub.  Abdomen: Soft. No mass.  No hernia. Normal bowel sounds.  Well healed laparoscopic abdominal scars. Extremities:  Good strength and ROM  in upper and lower extremities. Neurologic:  Grossly intact to motor and sensory function. Psychiatric: Has normal mood and affect. Behavior is normal.   DATA REVIEWED: Labs  and chart.  Demira Gwynne, MD,  FACS [facility] [address]  [address]   [place], [place]    [address] Phone:  [phone] FAX:  [phone]  

## 2012-12-25 NOTE — Transfer of Care (Signed)
Immediate Anesthesia Transfer of Care Note  Patient: Paul Shannon  Procedure(s) Performed: Procedure(s) (LRB) with comments: LAPAROSCOPIC GASTRIC BANDING (N/A) - Lap Band Placement MESH APPLIED TO LAP PORT () LIVER BIOPSY ()  Patient Location: PACU  Anesthesia Type:General  Level of Consciousness: awake, sedated and patient cooperative  Airway & Oxygen Therapy: Patient Spontanous Breathing and Patient connected to face mask oxygen  Post-op Assessment: Report given to PACU RN and Post -op Vital signs reviewed and stable  Post vital signs: Reviewed and stable  Complications: No apparent anesthesia complications

## 2012-12-25 NOTE — Op Note (Signed)
12/25/2012  3:26 PM  PATIENT:  Paul Shannon, 47 y.o., male, MRN: [id number]  PREOP DIAGNOSIS:  morbid obesity, NASH changes of liver  POSTOP DIAGNOSIS:   Morbid Obesity, Weight - 231, BMI - 33.2, NASH changes of liver (looks better than laparosocpy 05/11/2012)  PROCEDURE:   Laparoscopic adjustable gastric band, AP Standard, Core biopsy of right lobe of liver  SURGEON:   Ovidio Kin, M.D.  Threasa HeadsTrude Mcburney, D.O.  ANESTHESIA:   general  Phillips Grout, MD - Anesthesiologist Peggy Williford - CRNA Elesa Massed, CRNA - CRNA  General  EBL:  minimal  ml  BLOOD ADMINISTERED: none  LOCAL MEDICATIONS USED:   30 cc 1/4% marcaine  SPECIMEN:   None  COUNTS CORRECT:  YES  INDICATIONS FOR PROCEDURE:  Paul Shannon is a 47 y.o. (DOB: 20-Dec-1965) white male whose primary care physician is [name],[name], MD and comes for adjustable laparoscopic gastric band placement.  We had extensive preop discussions about lap band vs gastric bypass which included the benefits and the risks.   The indications and risks of the Lap Band surgery were explained to the patient.  The risks include, but are not limited to, infection, bleeding, bowel injury, and failure to lose weight.  Long term risk include, but are not limited to, slippage and erosion of the Lap Band.   Part of the reason to do the surgery is liver changes secondary to NASH.  We discussed repeating liver biopsy at this time, which I would decide intraoperatively.  OPERATIVE NOTE:  The patient was taken to room #1 at [facility] for the operation.  Phillips Grout, MD was the supervising anesthesiologist.  The patient's abdomen was prepped with Chloroprep and sterilely draped.  The patient was given Ancef at the beginning of the operation.   A time out was held and the surgical checklist reviewed.   The abdomen was accessed in the LUQ with an 5 mm trocar.  Four additional trocars were placed: a 5 mm trocar in the subxiphoid area for the liver  retractor, a 15 mm trocar in the right costal margin, a 12 mm trocar to the right of midline, and a 5 mm trocar to the left of midline.   An abdominal exploration was carried out.  The liver had nodular changes, but the changes did not look as bad as during his laparoscopy 05/11/2012. I take this as a positive effect of his diet and weight loss. Photos were taken and placed in the chart.  I did a core biopsy of the left lobe of the liver and sent this to pathology.  The stomach was unremarkable.  The remainder of the bowel, which could be seen, was unremarkable.   A test for a hiatal hernia was done using the sizing balloon.  The sizing tube was advanced into the stomach, 15 cc and 10 cc of air was placed in the sizing balloon, and the balloon pulled back to the esophageal hiatus.  There was no evidence of a hiatal hernia.   I made an incision at the Angle of Hiss and identified the left crus.  I then opened the gastrohepatic ligament over the caudate lobe of the liver.  I identified the right crus and passed the "finger" dissector behind the gastroesophageal junction.   I then used a AP Standard Lap Band and inserted the silastic tubing through the tip of the finger dissector and pulled the tubing around the proximal stomach.  The sizing tube was  passed into the stomach and the lap band closed over the sizing tube.  The sizing tube was removed.   I then imbricated the lateral stomach over the lap band and sewed it to the stomach proximal to the lap band.  I used 2-0 Ethibond sutures secured with Tye-Knots to create the gastro-gastric imbrication over the lateral Lap Band.  I placed 3 sutures. Photos were taken and placed in the chart.  The lap band and tubing lay in good position.  The tubing was brought through the right lateral trocar site and attached to the lap band reservoir.  The reservoir has a piece of polypropylene mesh sewn on the back to fixate it in the subcutaneous tissues.   I infiltrated  Marcaine into each incision for a total of 30  cc.   Each incision was sewn with 5-0 Vicryl sutures and the skin was painted with Dermabond.  The patient tolerated the procedure well and was sent to the recovery room in good condition.  The sponge and needle count were correct at the end of the case.   Because of his underlying liver disease, I will keep him overnight.  Ovidio Kin, MD, [facility] Pager: [phone] Office phone:  [phone]

## 2012-12-25 NOTE — Interval H&P Note (Signed)
History and Physical Interval Note:  12/25/2012 1:46 PM  Paul Shannon  has presented today for surgery, with the diagnosis of morbid obesity  The various methods of treatment have been discussed with the patient and family.  The patient also raised the question of a liver biopsy.  The last biopsy was 05/11/2012.  Unless I perceive some significant change, I probably will not re-biopsy him, but it is on the permit.  His wife is here.  After consideration of risks, benefits and other options for treatment, the patient has consented to  Procedure(s) (LRB) with comments: LAPAROSCOPIC GASTRIC BANDING (N/A) - Lap Band Placement as a surgical intervention .  The patient's history has been reviewed, patient examined, no change in status, stable for surgery.  I have reviewed the patient's chart and labs.  Questions were answered to the patient's satisfaction.     Cranston Koors H

## 2012-12-25 NOTE — Anesthesia Preprocedure Evaluation (Signed)
Anesthesia Evaluation  Patient identified by MRN, date of birth, ID band Patient awake    Reviewed: Allergy & Precautions, H&P , NPO status , Patient's Chart, lab work & pertinent test results  Airway Mallampati: II TM Distance: >3 FB Neck ROM: Full    Dental No notable dental hx.    Pulmonary neg pulmonary ROS,  breath sounds clear to auscultation  Pulmonary exam normal       Cardiovascular hypertension, Pt. on medications negative cardio ROS  Rhythm:Regular Rate:Normal     Neuro/Psych negative neurological ROS  negative psych ROS   GI/Hepatic negative GI ROS, Neg liver ROS, GERD-  Controlled,  Endo/Other  negative endocrine ROSHypothyroidism   Renal/GU negative Renal ROS  negative genitourinary   Musculoskeletal negative musculoskeletal ROS (+)   Abdominal   Peds negative pediatric ROS (+)  Hematology negative hematology ROS (+)   Anesthesia Other Findings   Reproductive/Obstetrics negative OB ROS                           Anesthesia Physical  Anesthesia Plan  ASA: II  Anesthesia Plan: General   Post-op Pain Management:    Induction: Intravenous  Airway Management Planned: Oral ETT  Additional Equipment:   Intra-op Plan:   Post-operative Plan: Extubation in OR  Informed Consent: I have reviewed the patients History and Physical, chart, labs and discussed the procedure including the risks, benefits and alternatives for the proposed anesthesia with the patient or authorized representative who has indicated his/her understanding and acceptance.   Dental advisory given  Plan Discussed with: CRNA  Anesthesia Plan Comments:         Anesthesia Quick Evaluation

## 2012-12-25 NOTE — Anesthesia Postprocedure Evaluation (Signed)
  Anesthesia Post-op Note  Patient: Paul Shannon  Procedure(s) Performed: Procedure(s) (LRB): LAPAROSCOPIC GASTRIC BANDING (N/A) MESH APPLIED TO LAP PORT () LIVER BIOPSY ()  Patient Location: PACU  Anesthesia Type: General  Level of Consciousness: awake and alert   Airway and Oxygen Therapy: Patient Spontanous Breathing  Post-op Pain: mild  Post-op Assessment: Post-op Vital signs reviewed, Patient's Cardiovascular Status Stable, Respiratory Function Stable, Patent Airway and No signs of Nausea or vomiting  Last Vitals:  Filed Vitals:   12/25/12 1654  BP: 113/68  Pulse: 75  Temp: 36.7 C  Resp: 16    Post-op Vital Signs: stable   Complications: No apparent anesthesia complications

## 2012-12-26 ENCOUNTER — Encounter (HOSPITAL_COMMUNITY): Payer: Self-pay | Admitting: Surgery

## 2012-12-26 ENCOUNTER — Observation Stay (HOSPITAL_COMMUNITY): Payer: BC Managed Care – PPO

## 2012-12-26 LAB — CBC WITH DIFFERENTIAL/PLATELET
Eosinophils Absolute: 0.1 10*3/uL (ref 0.0–0.7)
Hemoglobin: 14.2 g/dL (ref 13.0–17.0)
Lymphs Abs: 1.3 10*3/uL (ref 0.7–4.0)
MCH: 32.9 pg (ref 26.0–34.0)
Monocytes Relative: 9 % (ref 3–12)
Neutrophils Relative %: 78 % — ABNORMAL HIGH (ref 43–77)
RBC: 4.31 MIL/uL (ref 4.22–5.81)

## 2012-12-26 NOTE — Progress Notes (Signed)
POD#1 Evening visit:  S - Still with crampy pain, no vomiting.  He passed a little gas, but this has not helped.  He's walked a lot today.  Father in law in room  O -  BP 133/86  Pulse 87  Temp 98.3 F (36.8 C) (Oral)  Resp 18  Ht 5\' 10"  (1.778 m)  Wt 215 lb (97.523 kg)  BMI 30.85 kg/m2  SpO2 95%  Abdomen:  Mild distention, but has some bowel sounds.  Wounds okay.  KUB okay except a lot of gas in small and large bowel.  WBC - 10,800 - 12/26/2012  A&P -  1.  Lap band - position okay. 2.  Ileus with crampy pain.  Not sure why he is having this trouble.  I cannot find an obvious source.  Will repeat CBC in AM.  Ovidio Kin, MD, [facility] Pager: [phone] Office phone:  [phone]

## 2012-12-26 NOTE — Progress Notes (Signed)
Patient alert and oriented.  Patient with complaints of intermittent lower abdominal pain with minimal relief from IV prn medication.  Patient rating pain 10/10.  Patients vss, denies burping, passing gas, or bowel movement.  Spoke with Dr.Newman concerning patients pain.  Informed by Dr.Newman that abdominal xray revealed findings consistent with an ileus.  Nurse made aware of xray results.  Findings discussed with patient and spouse.  Patient instructed on the importance of ambulating, and wearing compression hose.  Rocking chair provided to aid in passing of gas, and Roxicodone added to prn medication regimen. The discharge instructions listed below given to patient to review, will discuss prior to discharge.  Paul Belmares, Rn   ADJUSTABLE GASTRIC BAND DISCHARGE INSTRUCTIONS  Drs. Fredrik Rigger, Hoxworth, Wilson, and [place] Call if you have any problems.   Call [phone] and ask for the surgeon on call.    If you need immediate assistance come to the ER at [facility]. Tell the ER personnel that you are a new post-op gastric banding patient. Signs and symptoms to report:   Severe vomiting or nausea. If you cannot tolerate clear liquids for longer than 1 day, you need to call your surgeon.    Abdominal pain which does not get better after taking your pain medication   Fever greater than 101 F degree   Difficulty breathing   Chest pain    Redness, swelling, drainage, or foul odor at incision sites    If your incisions open or pull apart   Swelling or pain in calf (lower leg)   Diarrhea, frequent watery, uncontrolled bowel movements.   Constipation, (no bowel movements for 3 days) if this occurs, Take Milk of Magnesia, 2 tablespoons by mouth, 3 times a day for 2 days if needed.  Call your doctor if constipation continues. Stop taking Milk of Magnesia once you have had a bowel movement. You may also use Miralax according to the label instructions.   Anything you consider "abnormal for you".   Normal side effects after Surgery:   Unable to sleep at night or concentrate   Irritability   Being tearful (crying) or depressed   These are common complaints, possibly related to your anesthesia, stress of surgery and change in lifestyle, that usually go away a few weeks after surgery.  If these feelings continue, call your medical doctor.  Wound Care You may have surgical glue, steri-strips, or staples over your incisions after surgery.  Surgical glue:  Looks like a clear film over your incisions and will wear off gradually. Steri-strips: Strips of tape over your incisions. You may notice a yellowish color on the skin underneath the steri-strips. This is a substance used to make the steri-strips stick better. Do not pull the steri-strips off - let them fall off. Staples: Cherlynn Polo may be removed before you leave the hospital. If you go home with staples, call [facility] [phone]) for an appointment with your surgeon's nurse to have staples removed in 7 - 10 days. Showering: You may shower two days after your surgery unless otherwise instructed by your surgeon. Wash gently around wounds with warm soapy water, rinse well, and gently pat dry.  If you have a drain, you may need someone to hold this while you shower. Avoid tub baths until staples are removed and incisions are healed.    Medications   Medications should be liquid or crushed if larger than the size of a dime.  Extended release pills should not be crushed.  Depending on the size and number of medications you take, you may need to stagger/change the time you take your medications so that you do not over-fill your pouch.    Make sure you follow-up with your primary care physician to make medication adjustments needed during rapid weight loss and life-style adjustment.   If you are diabetic, follow up with the doctor that prescribes your diabetes medication(s) within one week after surgery and check your blood sugar  regularly.   Do not drive while taking narcotics!   Do not take acetaminophen (Tylenol) and Roxicet or Lortab Elixir at the same time since these pain medications contain acetaminophen.  Diet at home: (First 2 Weeks)  You will see the nutritionist two weeks after your surgery. She will advance your diet if you are tolerating liquids well. Once at home, if you have severe vomiting or nausea and cannot tolerate clear liquids lasting longer than 1 day, call your surgeon.  For Same Day Surgery Discharge Patients: The day of surgery drink water only: 2 ounces every 4 hours. If you are tolerating water, begin drinking your high protein shake the next morning. For Overnight Stay Patients: Begin high protein shake 2 ounces every 3 hours, 5 - 6 times per day.  Gradually increase the amount you drink as tolerated.  You may find it easier to slowly sip shakes throughout the day.  It is important to get your proteins in first.   Protein Shake   Drink at least 2 ounces of shake 5-6 times per day   Each serving of protein shakes should have a minimum of 15 grams of protein and no more than 5 grams of carbohydrate    Increase the amount of protein shake you drink as tolerated   Protein powder may be added to fluids such as non-fat milk or Lactaid milk (limit to 20 grams added protein powder per serving   The initial goal is to drink at least 8 ounces of protein shake/drink per day (or as directed by the nutritionist). Some examples of protein shakes are [employer], [employer]illard's, EAS Edge HP, and Unjury. Hydration   Gradually increase the amount of water and other liquids as tolerated (See Acceptable Fluids)   Gradually increase the amount of protein shake as tolerated     Sip fluids slowly and throughout the day   May use Sugar substitutes, use sparingly (limit to 6 - 8 packets per day).  Your fluid goal is 64 ounces of fluid daily. It may take a few weeks to build up to this.         32 oz (or more)  should be clear liquids and 32 oz (or more) should be full liquids.         Liquids should not contain sugar, caffeine, or carbonation!  Acceptable Fluids Clear Liquids:   Water or Sugar-free flavored water, Fruit H2O   Decaffeinated coffee or tea (sugar-free)   Crystal Lite, Wyler's Lite, Minute Maid Lite   Sugar-free Jell-O   Bouillon or broth   Sugar-free Popsicle:   *Less than 20 calories each; Limit 1 per day   Full Liquids:              Protein Shakes/Drinks + 2 choices per day of other full liquids shown below.    Other full liquids must be: No more than 12 grams of Carbs per serving,  No more than 3 grams of Fat per serving   Strained low-fat cream soup  Non-Fat milk   Fat-free Lactaid Milk   Sugar-free yogurt (Dannon Lite & Fit) Vitamins and Minerals (Start 1 day after surgery unless otherwise directed)   1 Chewable Multivitamin / Multimineral Supplement (i.e. Centrum for Adults)   Chewable Calcium Citrate with Vitamin D-3. Take 1500 mg each day.           (Example: 3 Chewable Calcium Plus 600 with Vitamin D-3 can be found at [facility])           Do not mix multivitamins containing iron with calcium supplements; take 2 hours   apart   Do not substitute Tums (calcium carbonate) for your calcium   Menstruating women and those at risk for anemia may need extra iron. Talk with your doctor to see if you need additional iron.     If you need extra iron:  Total daily Iron recommendations (including Vitamins) = 50 - 100 mg Iron/day Do not stop taking or change any vitamins or minerals until you talk to your nutritionist or surgeon. Your nutritionist and / or physician must approve all vitamin and mineral supplements. Exercise For maximum success, begin exercising as soon as your doctor recommends. Make sure your physician approves any physical activity.   Depending on fitness level, begin with a simple walking program   Walk 5-15 minutes each day, 7 days per week.    Slowly increase  until you are walking 30-45 minutes per day   Consider joining our BELT program. [phone] or email [email or web address] Things to remember:   You may have sexual relations when you feel comfortable. It is VERY important for male patients to use a reliable birth control method. Fertility often increases after surgery. Do not get pregnant for at least 18 months.   It is very important to keep all follow up appointments with your surgeon, nutritionist, primary care physician, and behavioral health practitioner. After the first year, please follow up with your bariatric surgeon at least once a year in order to maintain best weight loss results.  [facility]: [phone] [facility]: [phone]   Free counseling is available for you and your family through collaboration between [facility] and [place]. Please call [phone] and leave a message.    Consider purchasing a medical alert bracelet that says you had lap-band surgery.    The [facility] has a free Bariatric Surgery Support Group that meets monthly, the 3rd Thursday, 6 pm, Classroom #1, [employer]. You may register online at [email or web address], but registration is not necessary. Select Classes and Support Groups, Bariatric Surgery, or Call [phone]   Do not return to work or drive until cleared by your surgeon   Use your CPAP when sleeping if applicable   Do not lift anything greater than ten pounds for at least two weeks.   You will probably have your first fill (fluid added to your band) 6 weeks after surgery

## 2012-12-26 NOTE — Progress Notes (Signed)
General Surgery Note  LOS: 1 day  POD -  @RRDAYSPOSTSURG @  Assessment/Plan: 1.  LAPAROSCOPIC GASTRIC BANDING, MESH APPLIED TO LAP PORT, LIVER BIOPSY - D. Brocha Gilliam - 12/26/2012  LLQ abdominal spasms - uncertain etitiology - will see how he does this AM  2. Esophageal nodule - benign squamous papilloma.  3. History of diverticulitis.  4. Hypertension  5. Seasonal allergies  6. History of kidney stones.  7. Questionable right inguinal hernia.   Does not really bother him.  8. On thyroid replacement. For about 10 years.   TSH - 0.19 - 06/12/2012.  9. Liver with 70% steatosis changes. Biopsy done during his lap chole on 05/11/2012.   He has a NASH score of 8.   His albumin was 4.1 on 06/12/2012. His AST was 91 and ALT was 128 on 06/12/2012.  10. Biliary dyskinesia - S/P lap chole - 05/11/2012 - D. Velvet Moomaw  11. Post cholecystectomy diarrhea.   12. DVT prophylaxis - sQ heparin 13.  On isolation for nasal MRSA.  Subjective:  Having LLQ abdominal pain that is in spasms.  Will come and go.  No nausea or vomiting.  Tolerated water.  Voiding okay.  But pain is clearly bothering him.  D/C decision on how he does through the morning.  Objective:   Filed Vitals:   12/26/12 0546  BP: 136/91  Pulse: 100  Temp: 98.5 F (36.9 C)  Resp: 20     Intake/Output from previous day:  01/14 0701 - 01/15 0700 In: 4001.7 [P.O.:60; I.V.:3941.7] Out: 1700 [Urine:1700]  Intake/Output this shift:      Physical Exam:   General: WN WM who is alert and oriented.    HEENT: Normal. Pupils equal. .   Lungs:    Abdomen: Soft between abdominal spasms   Wound: look okay     Lab Results:   No results found for this basename: WBC:2,HGB:2,HCT:2,PLT:2 in the last 72 hours  BMET   Basename 12/25/12 1040  NA --  K 3.4*  CL --  CO2 --  GLUCOSE --  BUN --  CREATININE --  CALCIUM --    PT/INR  No results found for this basename: LABPROT:2,INR:2 in the last 72 hours  ABG  No results found for this basename:  PHART:2,PCO2:2,PO2:2,HCO3:2 in the last 72 hours   Studies/Results:  No results found.   Anti-infectives:   Anti-infectives     Start     Dose/Rate Route Frequency Ordered Stop   12/25/12 1030   ciprofloxacin (CIPRO) IVPB 400 mg        400 mg 200 mL/hr over 60 Minutes Intravenous On call to O.R. 12/25/12 1030 12/25/12 1348          Ovidio Kin, MD, FACS Pager: [phone],   [facility] Office: [phone] 12/26/2012

## 2012-12-26 NOTE — Progress Notes (Signed)
Patient alert and oriented, up in rocking chair with compression hose on.  Patients abdominal pain decreased with prn medication, rating pain 6/10.  Patient exerperiencing nausea, nurse made aware.  Patient has follow up appointments with [facility] and [facility].  Patient is aware of Belt program and hospital support group. Patient is encouraged to continue ambulated in hallways.  Discharge instructions listed below reviewed with patient and spouse.  Merritt Kibby, Rn  ADJUSTABLE GASTRIC BAND DISCHARGE INSTRUCTIONS  Drs. Fredrik Rigger, Hoxworth, Wilson, and [place] Call if you have any problems.   Call [phone] and ask for the surgeon on call.    If you need immediate assistance come to the ER at [facility]. Tell the ER personnel that you are a new post-op gastric banding patient. Signs and symptoms to report:   Severe vomiting or nausea. If you cannot tolerate clear liquids for longer than 1 day, you need to call your surgeon.    Abdominal pain which does not get better after taking your pain medication   Fever greater than 101 F degree   Difficulty breathing   Chest pain    Redness, swelling, drainage, or foul odor at incision sites    If your incisions open or pull apart   Swelling or pain in calf (lower leg)   Diarrhea, frequent watery, uncontrolled bowel movements.   Constipation, (no bowel movements for 3 days) if this occurs, Take Milk of Magnesia, 2 tablespoons by mouth, 3 times a day for 2 days if needed.  Call your doctor if constipation continues. Stop taking Milk of Magnesia once you have had a bowel movement. You may also use Miralax according to the label instructions.   Anything you consider "abnormal for you".   Normal side effects after Surgery:   Unable to sleep at night or concentrate   Irritability   Being tearful (crying) or depressed   These are common complaints, possibly related to your anesthesia, stress of surgery and change in lifestyle, that usually go away a few  weeks after surgery.  If these feelings continue, call your medical doctor.  Wound Care You may have surgical glue, steri-strips, or staples over your incisions after surgery.  Surgical glue:  Looks like a clear film over your incisions and will wear off gradually. Steri-strips: Strips of tape over your incisions. You may notice a yellowish color on the skin underneath the steri-strips. This is a substance used to make the steri-strips stick better. Do not pull the steri-strips off - let them fall off. Staples: Cherlynn Polo may be removed before you leave the hospital. If you go home with staples, call [facility] [phone]) for an appointment with your surgeon's nurse to have staples removed in 7 - 10 days. Showering: You may shower two days after your surgery unless otherwise instructed by your surgeon. Wash gently around wounds with warm soapy water, rinse well, and gently pat dry.  If you have a drain, you may need someone to hold this while you shower. Avoid tub baths until staples are removed and incisions are healed.    Medications   Medications should be liquid or crushed if larger than the size of a dime.  Extended release pills should not be crushed.   Depending on the size and number of medications you take, you may need to stagger/change the time you take your medications so that you do not over-fill your pouch.    Make sure you follow-up with your primary care physician to make medication  adjustments needed during rapid weight loss and life-style adjustment.   If you are diabetic, follow up with the doctor that prescribes your diabetes medication(s) within one week after surgery and check your blood sugar regularly.   Do not drive while taking narcotics!   Do not take acetaminophen (Tylenol) and Roxicet or Lortab Elixir at the same time since these pain medications contain acetaminophen.  Diet at home: (First 2 Weeks)  You will see the nutritionist two weeks after your  surgery. She will advance your diet if you are tolerating liquids well. Once at home, if you have severe vomiting or nausea and cannot tolerate clear liquids lasting longer than 1 day, call your surgeon.  For Same Day Surgery Discharge Patients: The day of surgery drink water only: 2 ounces every 4 hours. If you are tolerating water, begin drinking your high protein shake the next morning. For Overnight Stay Patients: Begin high protein shake 2 ounces every 3 hours, 5 - 6 times per day.  Gradually increase the amount you drink as tolerated.  You may find it easier to slowly sip shakes throughout the day.  It is important to get your proteins in first.   Protein Shake   Drink at least 2 ounces of shake 5-6 times per day   Each serving of protein shakes should have a minimum of 15 grams of protein and no more than 5 grams of carbohydrate    Increase the amount of protein shake you drink as tolerated   Protein powder may be added to fluids such as non-fat milk or Lactaid milk (limit to 20 grams added protein powder per serving   The initial goal is to drink at least 8 ounces of protein shake/drink per day (or as directed by the nutritionist). Some examples of protein shakes are [employer], [employer]illard's, E[employer] Edge HP, and Unjury. Hydration   Gradually increase the amount of water and other liquids as tolerated (See Acceptable Fluids)   Gradually increase the amount of protein shake as tolerated     Sip fluids slowly and throughout the day   May use Sugar substitutes, use sparingly (limit to 6 - 8 packets per day).  Your fluid goal is 64 ounces of fluid daily. It may take a few weeks to build up to this.         32 oz (or more) should be clear liquids and 32 oz (or more) should be full liquids.         Liquids should not contain sugar, caffeine, or carbonation!  Acceptable Fluids Clear Liquids:   Water or Sugar-free flavored water, Fruit H2O   Decaffeinated coffee or tea (sugar-free)     Crystal Lite, Wyler's Lite, Minute Maid Lite   Sugar-free Jell-O   Bouillon or broth   Sugar-free Popsicle:   *Less than 20 calories each; Limit 1 per day   Full Liquids:              Protein Shakes/Drinks + 2 choices per day of other full liquids shown below.    Other full liquids must be: No more than 12 grams of Carbs per serving,  No more than 3 grams of Fat per serving   Strained low-fat cream soup   Non-Fat milk   Fat-free Lactaid Milk   Sugar-free yogurt (Dannon Lite & Fit) Vitamins and Minerals (Start 1 day after surgery unless otherwise directed)   1 Chewable Multivitamin / Multimineral Supplement (i.e. Centrum for Adults)   Chewable Calcium  Citrate with Vitamin D-3. Take 1500 mg each day.           (Example: 3 Chewable Calcium Plus 600 with Vitamin D-3 can be found at [facility])           Do not mix multivitamins containing iron with calcium supplements; take 2 hours   apart   Do not substitute Tums (calcium carbonate) for your calcium   Menstruating women and those at risk for anemia may need extra iron. Talk with your doctor to see if you need additional iron.     If you need extra iron:  Total daily Iron recommendations (including Vitamins) = 50 - 100 mg Iron/day Do not stop taking or change any vitamins or minerals until you talk to your nutritionist or surgeon. Your nutritionist and / or physician must approve all vitamin and mineral supplements. Exercise For maximum success, begin exercising as soon as your doctor recommends. Make sure your physician approves any physical activity.   Depending on fitness level, begin with a simple walking program   Walk 5-15 minutes each day, 7 days per week.    Slowly increase until you are walking 30-45 minutes per day   Consider joining our BELT program. [phone] or email [email or web address] Things to remember:   You may have sexual relations when you feel comfortable. It is VERY important for male patients to use a reliable birth  control method. Fertility often increases after surgery. Do not get pregnant for at least 18 months.   It is very important to keep all follow up appointments with your surgeon, nutritionist, primary care physician, and behavioral health practitioner. After the first year, please follow up with your bariatric surgeon at least once a year in order to maintain best weight loss results.  [facility]: [phone] [facility]: [phone]   Free counseling is available for you and your family through collaboration between [facility] and [place]. Please call [phone] and leave a message.    Consider purchasing a medical alert bracelet that says you had lap-band surgery.    The [facility] has a free Bariatric Surgery Support Group that meets monthly, the 3rd Thursday, 6 pm, Classroom #1, [employer]. You may register online at [email or web address], but registration is not necessary. Select Classes and Support Groups, Bariatric Surgery, or Call [phone]   Do not return to work or drive until cleared by your surgeon   Use your CPAP when sleeping if applicable   Do not lift anything greater than ten pounds for at least two weeks.   You will probably have your first fill (fluid added to your band) 6 weeks after surgery

## 2012-12-27 LAB — CBC WITH DIFFERENTIAL/PLATELET
Eosinophils Absolute: 0.7 10*3/uL (ref 0.0–0.7)
Eosinophils Relative: 7 % — ABNORMAL HIGH (ref 0–5)
HCT: 37.9 % — ABNORMAL LOW (ref 39.0–52.0)
Hemoglobin: 13.5 g/dL (ref 13.0–17.0)
Lymphs Abs: 2 10*3/uL (ref 0.7–4.0)
MCH: 32.4 pg (ref 26.0–34.0)
MCV: 90.9 fL (ref 78.0–100.0)
Monocytes Absolute: 0.9 10*3/uL (ref 0.1–1.0)
Monocytes Relative: 9 % (ref 3–12)
Platelets: 185 10*3/uL (ref 150–400)
RBC: 4.17 MIL/uL — ABNORMAL LOW (ref 4.22–5.81)

## 2012-12-27 MED ORDER — OXYCODONE-ACETAMINOPHEN 5-325 MG/5ML PO SOLN: 5.0000 mL | Freq: Four times a day (QID) | ORAL | Status: DC | PRN

## 2012-12-27 NOTE — Care Management Note (Signed)
    Page 1 of 1   12/27/2012     11:47:34 AM   CARE MANAGEMENT NOTE 12/27/2012  Patient:  CUINN, WESTERHOLD   Account Number:  [id number]  Date Initiated:  12/27/2012  Documentation initiated by:  Lorenda Ishihara  Subjective/Objective Assessment:     Action/Plan:   Anticipated DC Date:  12/27/2012   Anticipated DC Plan:  HOME/SELF CARE      DC Planning Services  CM consult      Choice offered to / List presented to:             Status of service:  Completed, signed off Medicare Important Message given?   (If response is "NO", the following Medicare IM given date fields will be blank) Date Medicare IM given:   Date Additional Medicare IM given:    Discharge Disposition:  HOME/SELF CARE  Per UR Regulation:  Reviewed for med. necessity/level of care/duration of stay  If discussed at Long Length of Stay Meetings, dates discussed:    Comments:

## 2012-12-27 NOTE — Progress Notes (Signed)
General Surgery Note  LOS: 2 days  POD -  2 Days Post-Op  Assessment/Plan: 1.  LAPAROSCOPIC GASTRIC BANDING, MESH APPLIED TO LAP PORT, LIVER BIOPSY - D. Aadyn Buchheit - 12/26/2012  Doing better.  D/C home today.  1b. NASH - repeat liver biopsy pending  2. Esophageal nodule - benign squamous papilloma.  3. History of diverticulitis.  4. Hypertension  5. Seasonal allergies  6. History of kidney stones.  7. Questionable right inguinal hernia.   Does not really bother him.  8. On thyroid replacement. For about 10 years.   TSH - 0.19 - 06/12/2012.  9. Liver with 70% steatosis changes. Biopsy done during his lap chole on 05/11/2012.   He has a NASH score of 8.   His albumin was 4.1 on 06/12/2012. His AST was 91 and ALT was 128 on 06/12/2012.  10. Biliary dyskinesia - S/P lap chole - 05/11/2012 - D. Jaelani Posa  11. Post cholecystectomy diarrhea.   12. DVT prophylaxis - sQ heparin 13.  On isolation for nasal MRSA.  Subjective:  Doing much better today.  He passed some gas about 10PM last night and has felt better since then.  Ready to go home. Objective:   Filed Vitals:   12/26/12 2100  BP: 131/86  Pulse: 83  Temp: 99.4 F (37.4 C)  Resp: 20     Intake/Output from previous day:  01/15 0701 - 01/16 0700 In: 2108.3 [P.O.:20; I.V.:2088.3] Out: 575 [Urine:575]  Intake/Output this shift:      Physical Exam:   General: WN WM who is alert and oriented.    HEENT: Normal. Pupils equal. .   Lungs: Clear   Abdomen: Soft.  Few BS.   Wound: look okay     Lab Results:     Basename 12/27/12 0435 12/26/12 0830  WBC 10.0 10.8*  HGB 13.5 14.2  HCT 37.9* 38.4*  PLT 185 197    BMET    Basename 12/25/12 1040  NA --  K 3.4*  CL --  CO2 --  GLUCOSE --  BUN --  CREATININE --  CALCIUM --    PT/INR  No results found for this basename: LABPROT:2,INR:2 in the last 72 hours  ABG  No results found for this basename: PHART:2,PCO2:2,PO2:2,HCO3:2 in the last 72 hours   Studies/Results:  Dg  Abd 1 View  12/26/2012  *RADIOLOGY REPORT*  Clinical Data: Severe lower abdominal pain several hours status post laparoscopic gastric band placement.  ABDOMEN - 1 VIEW  Comparison: Prior CT abdomen/pelvis 04/12/2011; upper GI exam 09/18/2012  Findings: Interval placement of a laparoscopic gastric band.  The gastric band is appropriately angled approximately 45 degrees of projects over the expected region of the gastroesophageal junction. The connecting tubing and subcutaneous reservoir (right lower abdomen) appears intact and unremarkable.  There is diffuse gaseous distension of both large and small bowel throughout the abdomen. No large free air on these supine radiographs. Multilevel degenerative disc disease with lower lumbar facet arthropathy worse on the right than the left at L4-L5 and L5-S1. Mild bilateral hip osteoarthritis.  IMPRESSION:  1.  Diffuse gaseous distension of both large and small bowel throughout the abdomen most consistent with ileus. 2.  Interval surgical changes of laparoscopic gastric band placement without evidence of immediate complication   Original Report Authenticated By: Malachy Moan, M.D.      Anti-infectives:   Anti-infectives     Start     Dose/Rate Route Frequency Ordered Stop   12/25/12 1030   ciprofloxacin (  CIPRO) IVPB 400 mg        400 mg 200 mL/hr over 60 Minutes Intravenous On call to O.R. 12/25/12 1030 12/25/12 1348          Ovidio Kin, MD, FACS Pager: [phone],   [facility] [place] [facility] Office: [phone] 12/27/2012

## 2012-12-27 NOTE — Discharge Summary (Signed)
Physician Discharge Summary  Patient ID:  Paul Shannon  MRN: [id number]  DOB/AGE: [birth year] 46 y.o.  Admit date: 12/25/2012 Discharge date: 12/27/2012  Discharge Diagnoses:  1. LAPAROSCOPIC GASTRIC BANDING, MESH APPLIED TO LAP PORT, LIVER BIOPSY - D. Morgane Joerger - 12/26/2012   Doing better.   D/C home today.   1a. Liver with 70% steatosis changes. Biopsy done during his lap chole on 05/11/2012.   He has a NASH score of 8.   His albumin was 4.1 on 06/12/2012. His AST was 91 and ALT was 128 on 06/12/2012.   repeat liver biopsy pending 2. Esophageal nodule - benign squamous papilloma.  3. History of diverticulitis.  4. Hypertension  5. Seasonal allergies  6. History of kidney stones.  7. Questionable right inguinal hernia.   Does not really bother him.  8. On thyroid replacement. For about 10 years.   TSH - 0.19 - 06/12/2012.    9. Biliary dyskinesia - S/P lap chole - 05/11/2012 - D. Honore Wipperfurth  10. Post cholecystectomy diarrhea.  11. DVT prophylaxis - sQ heparin  12. On isolation for nasal MRSA.  Operation: Procedure(s): LAPAROSCOPIC GASTRIC BANDING, MESH APPLIED TO LAP PORT, LIVER BIOPSY on 12/25/2012  Discharged Condition: good  Hospital Course: Paul Shannon is an 47 y.o. male whose primary care physician is [name],[name], MD and who was admitted 12/25/2012.  He is morbidly obese with NASH changes of his liver.  He comes to the hospital for bariatric surgery to control his weight and hopefully help the [facility].  He was brought to the operating room on 12/25/2012 and underwent  Lap band and liver biopsy.  Post op he had gas pains the first 36 hours that kept him in the hospital and extra day.   The KUB showed the lap band in good position.  He is now 2 days post op, his abdominal pain is better, and his WBC is normal.  Discharge instructions have been reviewed.  Consults: None  Significant Diagnostic Studies: Results for orders placed during the hospital encounter of 12/25/12  POTASSIUM   Component Value Range   Potassium 3.4 (*) 3.5 - 5.1 mEq/L  CBC WITH DIFFERENTIAL      Component Value Range   WBC 10.8 (*) 4.0 - 10.5 K/uL   RBC 4.31  4.22 - 5.81 MIL/uL   Hemoglobin 14.2  13.0 - 17.0 g/dL   HCT 14.7 (*) 82.9 - 56.2 %   MCV 89.1  78.0 - 100.0 fL   MCH 32.9  26.0 - 34.0 pg   MCHC 37.0 (*) 30.0 - 36.0 g/dL   RDW 13.0  86.5 - 78.4 %   Platelets 197  150 - 400 K/uL   Neutrophils Relative 78 (*) 43 - 77 %   Neutro Abs 8.5 (*) 1.7 - 7.7 K/uL   Lymphocytes Relative 12  12 - 46 %   Lymphs Abs 1.3  0.7 - 4.0 K/uL   Monocytes Relative 9  3 - 12 %   Monocytes Absolute 1.0  0.1 - 1.0 K/uL   Eosinophils Relative 1  0 - 5 %   Eosinophils Absolute 0.1  0.0 - 0.7 K/uL   Basophils Relative 0  0 - 1 %   Basophils Absolute 0.0  0.0 - 0.1 K/uL  CBC WITH DIFFERENTIAL      Component Value Range   WBC 10.0  4.0 - 10.5 K/uL   RBC 4.17 (*) 4.22 - 5.81 MIL/uL   Hemoglobin 13.5  13.0 - 17.0  g/dL   HCT 65.7 (*) 84.6 - 96.2 %   MCV 90.9  78.0 - 100.0 fL   MCH 32.4  26.0 - 34.0 pg   MCHC 35.6  30.0 - 36.0 g/dL   RDW 95.2  84.1 - 32.4 %   Platelets 185  150 - 400 K/uL   Neutrophils Relative 63  43 - 77 %   Neutro Abs 6.4  1.7 - 7.7 K/uL   Lymphocytes Relative 20  12 - 46 %   Lymphs Abs 2.0  0.7 - 4.0 K/uL   Monocytes Relative 9  3 - 12 %   Monocytes Absolute 0.9  0.1 - 1.0 K/uL   Eosinophils Relative 7 (*) 0 - 5 %   Eosinophils Absolute 0.7  0.0 - 0.7 K/uL   Basophils Relative 0  0 - 1 %   Basophils Absolute 0.0  0.0 - 0.1 K/uL    Dg Abd 1 View  12/26/2012  *RADIOLOGY REPORT*  Clinical Data: Severe lower abdominal pain several hours status post laparoscopic gastric band placement.  ABDOMEN - 1 VIEW  Comparison: Prior CT abdomen/pelvis 04/12/2011; upper GI exam 09/18/2012  Findings: Interval placement of a laparoscopic gastric band.  The gastric band is appropriately angled approximately 45 degrees of projects over the expected region of the gastroesophageal junction. The connecting  tubing and subcutaneous reservoir (right lower abdomen) appears intact and unremarkable.  There is diffuse gaseous distension of both large and small bowel throughout the abdomen. No large free air on these supine radiographs. Multilevel degenerative disc disease with lower lumbar facet arthropathy worse on the right than the left at L4-L5 and L5-S1. Mild bilateral hip osteoarthritis.  IMPRESSION:  1.  Diffuse gaseous distension of both large and small bowel throughout the abdomen most consistent with ileus. 2.  Interval surgical changes of laparoscopic gastric band placement without evidence of immediate complication   Original Report Authenticated By: Malachy Moan, M.D.     Discharge Exam:  Filed Vitals:   12/26/12 2100  BP: 131/86  Pulse: 83  Temp: 99.4 F (37.4 C)  Resp: 20    General: WN WM who is alert and generally healthy appearing.  Lungs: Clear to auscultation and symmetric breath sounds. Heart:  RRR. No murmur or rub. Abdomen: Soft. No mass. No tenderness. No hernia. Normal bowel sounds. His incisions look good.  Discharge Medications:     Medication List     As of 12/27/2012  7:35 AM    TAKE these medications         aspirin 81 MG tablet   Take 81 mg by mouth daily with breakfast.      dicyclomine 20 MG tablet   Commonly known as: BENTYL   Take 20 mg by mouth daily as needed. For stomach      levothyroxine 175 MCG tablet   Commonly known as: SYNTHROID, LEVOTHROID   Take 175 mcg by mouth every morning.      losartan-hydrochlorothiazide 100-25 MG per tablet   Commonly known as: HYZAAR   Take 1 tablet by mouth every morning.      multivitamin capsule   Take 1 capsule by mouth daily.      oxyCODONE-acetaminophen 5-325 MG/5ML solution   Commonly known as: ROXICET   Take 5-10 mLs by mouth every 6 (six) hours as needed for pain.      potassium chloride SA 20 MEQ tablet   Commonly known as: K-DUR,KLOR-CON   Take 1 tablet (20 mEq total) by mouth  2 (two) times  daily.      VITAMIN B 12 PO   Take 1 tablet by mouth daily.        Disposition: 01-Home or Self Care      Discharge Orders    Future Appointments: Provider: Department: Dept Phone: Center:   01/08/2013 4:00 PM [facility] Post-Op Class [facility] [phone] NDM   01/10/2013 3:30 PM Kandis Cocking, MD [facility], [place] [phone] None     Future Orders Please Complete By Expires   Diet - low sodium heart healthy      Increase activity slowly        Return to work on: 2 weeks  Activity: Driving - may drive i 2 or 3 days if doing well   Lifting - no lifting greater than 15 pounds for one week, then no restrictions  Wound Care: May shower  Diet: Post lap band diet  Follow up appointment: Has an appt in about 2 weeks. Call Dr. Allene Pyo office [facility]) at [phone] for any problems.  Medications and dosages:   Resume your home medications.   You have a prescription for: Roxicet.  Signed: Ovidio Kin, M.D., FACS  12/27/2012, 7:35 AM

## 2013-01-10 ENCOUNTER — Encounter (INDEPENDENT_AMBULATORY_CARE_PROVIDER_SITE_OTHER): Payer: Self-pay | Admitting: Surgery

## 2013-01-10 ENCOUNTER — Ambulatory Visit (INDEPENDENT_AMBULATORY_CARE_PROVIDER_SITE_OTHER): Payer: BC Managed Care – PPO | Admitting: Surgery

## 2013-01-10 DIAGNOSIS — Z9884 Bariatric surgery status: Secondary | ICD-10-CM

## 2013-01-10 NOTE — Progress Notes (Addendum)
Re:   Paul Shannon DOB:   1966/05/28 MRN:   [id number]  ASSESSMENT AND PLAN: 1.  Morbid obesity - weight 228, BMI - 32.7    Lap Band, APS - 12/25/2012  Doing well.    Very encouraged by improvement of liver changes with current weight loss.  To see back in 8 weeks.  2.  Esophageal nodule - benign squamous papilloma. 3.  History of diverticulitis. 4.  Hypertension 5.  Seasonal allergies 6.  History of kidney stones. 7.  Questionable right inguinal hernia.  Does not really bother him. 8.  On thyroid replacement.  For about 10 years.  TSH - 0.19 - 06/12/2012. 9.  Liver with 70% steatosis changes. Biopsy done during his lap chole on 05/11/2012.  He has a NASH score of 8.   Significant improvement of liver disease on most recent laparoscopy/liver biopsy - 12/25/2012  [photos attached at end of progress note] 10.  Biliary dyskinesia - S/P lap chole - 05/11/2012 - D. [facility]  Chief Complaint  Patient presents with  . Bariatric Follow Up    1st lap band PO   REFERRING PHYSICIAN:  Dr. Darrel Hoover  HISTORY OF PRESENT ILLNESS: Paul Shannon is a 47 y.o. (DOB: 06-Jun-1966)  white male whose primary care physician is [name],[name], MD and comes to me follow for follow up of lap band placement.  The patient does feel some resistance with his Laband. He had one episode of food hanging up when he ate too fast.  He has had one episode of significant constipation. He realizes he has to drink a lot of fluid. He has some exacerbation of hemorrhoids. And occasionally he has prickly pain at his incision sites.  Overall, he is satisfied with his lap band.  Weight loss/Liver history: He understands the various options of weight loss surgery.  He has tried to diet, mainly limiting sugar intake and carbohydrate intake, and has had some modest success.  His father died in his 7's from cirrhosis, so this is driving him to consider weight loss surgery.  Much of the impetus for his interest in bariatric surgery is  the relation of his weight to his liver disease.  He realizes the seriousness of progressive liver disease.  He said he has letters of support for bariatric surgery from Drs. Kriste Basque and Arlyce Dice.  AFP - 1.7 - 06/18/2012, Hepatitis panel - neg., T. Bili - 1.8 - 06/12/2012, ALT - 128 - 06/12/2012 UGI - 08/21/2012 - normal Psych - Dr. Milas Hock - 09/11/2012 He has been to the pre op nutrition class    Past Medical History  Diagnosis Date  . Hypertension   . Unspecified hypothyroidism   . Overweight   . Esophageal reflux   . Irritable bowel syndrome   . Unspecified hemorrhoids without mention of complication   . Fatty liver     Dr Kinnie Scales   . Diverticulitis   . Arthritis   . History of kidney stones   . Biliary dyskinesia   . Anxiety state, unspecified yrs ago     Current Outpatient Prescriptions  Medication Sig Dispense Refill  . Cyanocobalamin (VITAMIN B 12 PO) Take 1 tablet by mouth daily.      [name] dicyclomine (BENTYL) 20 MG tablet Take 20 mg by mouth daily as needed. For stomach      . levothyroxine (SYNTHROID, LEVOTHROID) 175 MCG tablet Take 175 mcg by mouth every morning.      . Multiple Vitamin (MULTIVITAMIN) capsule Take  1 capsule by mouth daily.       . potassium chloride SA (K-DUR,KLOR-CON) 20 MEQ tablet Take 1 tablet (20 mEq total) by mouth 2 (two) times daily.  180 tablet  3  . aspirin 81 MG tablet Take 81 mg by mouth daily with breakfast.       . losartan-hydrochlorothiazide (HYZAAR) 100-25 MG per tablet Take 1 tablet by mouth every morning.        Allergies  Allergen Reactions  . Niacin     REACTION: pt states INTOL w/ flushing  . Other     Red dye=rash   . Penicillins     REACTION: childhood - extreme rash  . Rosuvastatin     REACTION: pt states INTOL   REVIEW OF SYSTEMS:  Cardiac:  Has history of hypertension since about 2005.  No history of seeing a cardiologist. Endocrine:  No diabetes. On thyroid replacement x 10 years. Gastrointestinal:  Liver biopsy with  steatosis.  Colonoscopy in 2012 by Dr. Arlyce Dice. Urologic:  History of kidney stones.  Last attack 5 years ago. Musculoskeletal:  Right knee 2010 by Drs Murphy/[facility]ainer.  SOCIAL and FAMILY HISTORY: Married.  2 children, ages 27 and 55. 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  PHYSICAL EXAM: BP 114/62  Pulse 68  Temp 98.8 F (37.1 C) (Oral)  Resp 16  Ht 5\' 10"  (1.778 m)  Wt 201 lb 6.4 oz (91.354 kg)  BMI 28.90 kg/m2  General: WN WM who is alert and generally healthy appearing. Has a white goatee. Abdomen: Soft. No mass.  No hernia. Normal bowel sounds.  Well healed laparoscopic abdominal scars.  DATA REVIEWED: Copy of path to patient.  Below are photos of his liver May 2013 and in Jan 2014.   Liver in May 2013     Liver in January 2014 -  Less nodular and less fatty.   Ovidio Kin, MD,  [facility] [address] [place]  [address]   [place], [place]    [address] Phone:  [phone] FAX:  [phone]

## 2013-01-15 ENCOUNTER — Encounter: Payer: BC Managed Care – PPO | Attending: Surgery | Admitting: *Deleted

## 2013-01-15 DIAGNOSIS — Z713 Dietary counseling and surveillance: Secondary | ICD-10-CM | POA: Insufficient documentation

## 2013-01-15 DIAGNOSIS — Z01818 Encounter for other preprocedural examination: Secondary | ICD-10-CM | POA: Insufficient documentation

## 2013-01-17 ENCOUNTER — Encounter: Payer: Self-pay | Admitting: *Deleted

## 2013-01-17 NOTE — Patient Instructions (Signed)
Patient to follow Phase 3A-Soft, High Protein Diet and follow-up at [facility] in 6 weeks for 2 months post-op nutrition visit for diet advancement. 

## 2013-01-17 NOTE — Progress Notes (Signed)
Bariatric Class:  Appt start time: 1600 end time:  1700.  2 Week Post-Operative Nutrition Class  Patient was seen on 01/15/13 for Post-Operative Nutrition education [facility].   Surgery date: 12/25/12  Surgery type: LAGB  Start weight at [facility]: 227.0 lbs (08/30/12)   Last weight: 221.1 lbs  Weight today: 200.0 lbs Weight change: 21.1 lbs Total weight lost: 27.0 lbs  Goal weight: 190 lbs % goal met: 73%  TANITA  BODY COMP RESULTS  01/15/13   BMI (kg/m^2) 28.7   Fat Mass (lbs) 49.0   Fat Free Mass (lbs) 151.0   Total Body Water (lbs) 110.5   The following the learning objectives were met by the patient during this course:  Identifies Phase 3A (Soft, High Proteins) Dietary Goals and will begin from 2 weeks post-operatively to 2 months post-operatively  Identifies appropriate sources of fluids and proteins   States protein recommendations and appropriate sources post-operatively  Identifies the need for appropriate texture modifications, mastication, and bite sizes when consuming solids  Identifies appropriate multivitamin and calcium sources post-operatively  Describes the need for physical activity post-operatively and will follow MD recommendations  States when to call healthcare provider regarding medication questions or post-operative complications  Handouts given during class include:  Phase 3A: Soft, High Protein Diet Handout  Band Fill Guidelines Handout  Follow-Up Plan: Patient will follow-up at [facility] in 6 weeks for 2 months post-op nutrition visit for diet advancement per MD.

## 2013-01-18 ENCOUNTER — Encounter: Payer: Self-pay | Admitting: Pulmonary Disease

## 2013-01-21 ENCOUNTER — Telehealth: Payer: Self-pay | Admitting: Pulmonary Disease

## 2013-01-21 ENCOUNTER — Encounter: Payer: Self-pay | Admitting: Pulmonary Disease

## 2013-01-21 ENCOUNTER — Ambulatory Visit (INDEPENDENT_AMBULATORY_CARE_PROVIDER_SITE_OTHER): Payer: BC Managed Care – PPO | Admitting: Pulmonary Disease

## 2013-01-21 VITALS — BP 120/72 | HR 61 | Temp 97.7°F | Ht 70.0 in | Wt 202.0 lb

## 2013-01-21 DIAGNOSIS — I10 Essential (primary) hypertension: Secondary | ICD-10-CM

## 2013-01-21 DIAGNOSIS — K649 Unspecified hemorrhoids: Secondary | ICD-10-CM

## 2013-01-21 DIAGNOSIS — E7800 Pure hypercholesterolemia, unspecified: Secondary | ICD-10-CM

## 2013-01-21 DIAGNOSIS — E039 Hypothyroidism, unspecified: Secondary | ICD-10-CM

## 2013-01-21 DIAGNOSIS — K7689 Other specified diseases of liver: Secondary | ICD-10-CM

## 2013-01-21 DIAGNOSIS — E663 Overweight: Secondary | ICD-10-CM

## 2013-01-21 DIAGNOSIS — N492 Inflammatory disorders of scrotum: Secondary | ICD-10-CM

## 2013-01-21 DIAGNOSIS — N498 Inflammatory disorders of other specified male genital organs: Secondary | ICD-10-CM

## 2013-01-21 MED ORDER — HYDROCORTISONE 2.5 % RE CREA: TOPICAL_CREAM | Freq: Two times a day (BID) | RECTAL | Status: DC

## 2013-01-21 MED ORDER — SULFAMETHOXAZOLE-TRIMETHOPRIM 800-160 MG PO TABS: 1.0000 | ORAL_TABLET | Freq: Two times a day (BID) | ORAL | Status: DC

## 2013-01-21 NOTE — Patient Instructions (Addendum)
Today we updated your med list in our EPIC system...    Continue your current medications the same...  For the scrotal infection>    Do the Sitz baths as we discussed...    Cover w/ some Neosporin...    Take the SEPTRA-DS one tab twice daily for 10d...  For the Hemorrhoids>    Do the Sitz baths 1-2 times daily...    Apply the ANUSOL-HC cream after each bowel movement & at bedtime...    Use the MIRALAX & SENAKOT-S daily to keep the stool soft 7 easily moving...  Call for any questions or if the scrotal infection doesn't totally resolve.Marland KitchenMarland Kitchen

## 2013-01-21 NOTE — Telephone Encounter (Signed)
Per SN pt to come in today at 4:30. Pt is aware. Nothing further needed.

## 2013-01-21 NOTE — Telephone Encounter (Signed)
Pt called back. He had requested to be seen today by dr Kriste Basque and has yet to hear back. Hazel Sams

## 2013-01-21 NOTE — Telephone Encounter (Signed)
I spoke with Paul Shannon. He c/o knot or cysts on scrotum x Thursday. States that last night it busted and is now draining and had some blood in it but not a lot. It is tender to touch He put neosporin on it. He is requesting to see SN today. Please advise thanks Last OV 06/12/12 Allergies  Allergen Reactions  . Niacin     REACTION: Paul Shannon states INTOL w/ flushing  . Other     Red dye=rash   . Penicillins     REACTION: childhood - extreme rash  . Rosuvastatin     REACTION: Paul Shannon states INTOL

## 2013-01-21 NOTE — Progress Notes (Signed)
Subjective:     Patient ID: Paul Shannon, male   DOB: 07/26/1966, 47 y.o.   MRN: [id number]  HPI  Review of Systems    Physical Exam        Subjective:    Patient ID: Paul Shannon, male    DOB: 1966/06/10, 47 y.o.   MRN: [id number]  HPI 47 y/o WM here for a follow up visit & CPX... he has multiple medical problems as noted below...   ~  June 01, 2011:  40mo ROV & post hosp visit> He was hospitalized 5/12 w/ acute diverticulitis, treated w/ Cipro & Flagyl & slowly improved; CT Abd revealed acute diverticulitis in prox sigmoid w/ perforation & pericolonic inflamm, and fatty liver disease; CCS consult by DrCornett w/ rec for med rx & no surg intervention; he has a post hosp follow up appt w/ DrKaplan soon...     Medically Paul Shannon has been OK> BP controlled on Losartan; note- CXR & EKG were not done during his recent hosp...    We did f/u Fasting labs and FLP looks good on diet alone now x for low HDL at 30 & advised incr exercise, get wt down; despite the hosp he hasn't lost much wt.    Clinically euthyroid on large dose of Synthroid & TSH= 1.09    He has some DJD knees and he right knee arthroscopy 10/11 by DrWainer...  ~  July 2. 2013:  1 year ROV & CPX> Paul Shannon developed abd pain 3/13 & had eval DrKaplan w/ an abn hepatobiliary scan showing an abn low ejection fraction of 2% (norm>30%); He had elev LFTs as well but this was felt to be more likely related to his known Hepatic Steatosis; he was referred to [facility] for Cholecystectomy & intraoperative liver biopsy to further investigate the status of his liver disease (recall hx of father passing away w/ cirrhosis due to this condition) as his last bx was in the 69's;  He did well w/ surgery & had neg intraop cholangiogram, & an uneventful recovery;  Liver Bx however showed progressive HepSteatosis- 70% steatosis & total NASH score of 8 (out of poss 9);  He has a follow up appt w/ DrKaplan to consider his options as he is one sm step away from  cirrhosis at this juncture & Bariatric surg may be an option...    We reviewed prob list, meds, xrays and labs> see below for updates>> LABS 5/13 in hosp:  Elev LFTs as noted;  CBC- ok  LABS 7/13:  FLP- ok on diet alone;  Chems- ok x persist incr LFTs due to steatosis;  TSH= 0.19 on Synth200 & we decided to slowly wean...  ~  January 21, 2013:  65mo ROV & Paul Shannon is s/p Lap Band surg for his Hepatic Steatosis & pos FamHx cirrhosis from this condition (his insurance wouldn't cove a gastric procedure); he has already lost over 30# down to 202# today & he says "I never would have known how bad I really felt til I could compare to how good I feel now" and he states he is now eating just 10% of what he ate before...    Presents w/ small knot on scrotum- ?local skin infection, not very severe, no regional lymphadenopathy, etc; we discussed Rx w/ sitz baths, Neosporin, course of empiric SeptraDSBid (allergy to PCN); to Urology if this is not resolving...    Requesting Rx for Hem's as well> we discussed sitz baths, Miralax/ Senakot, AnusolHC cream as needed.Marland KitchenMarland Kitchen  We reviewed prob list, meds, xrays and labs> see below for updates >>          Problem List:  HYPERTENSION (ICD-401.9) - controlled on HYZAAR 100-25 daily... BP=116/76 today, & he states OK at home ~130's to 140/ 80's-90... denies HA, fatigue, visual changes, CP, palipit, dizziness, syncope, dyspnea, edema, etc...  ~  CXR 4/11 showed low lung vol's but clear, normal Cor, NAD. ~  EKG showed NSR rate 60, no signif STTWA's, WNL. ~  CXR 5/13 showed normal heart size, clear lungs, NAD... ~  2/14: off prev Hyzaar100-25; BP= 120/72 w/ his wt loss on diet alone; he denies CP, palpit, SOB, edema, etc...  HYPERCHOLESTEROLEMIA (ICD-272.0) - he has refused meds preferring to attempt control on diet alone... he was intol to Niacin in past w/ flushing... intol to Crestor10 when tried in Sep09... ~  FLP 3/08 showed TChol 143, TG 190, HDL 30, LDL 75... rec to start  fibrate- he declined meds. ~  FLP 9/09 showed TChol 143, TG 123, HDL 20, LDL 99... rec to start Crestor10mg /d- he agrees. ~  pt states that he didn't tolerate the Crestor10 either, therefore stopped it. ~  FLP 4/11 showed TChol 130, TG 94, HDL 33, LDL 79... much improved, on diet alone. ~  FLP 6/12 on diet alone (after divertic episode) showed TChol 131, TG 112, HDL 30, LDL 78... rec incr exercise. ~  FLP 7/13 on diet alone showed TChol 123, TG 123, HDL 34, LDL 65 ~  2/14: he needs recheck fasting labs soon...  HYPOTHYROIDISM (ICD-244.9) - on SYNTHROID 282mcg/d w/ ?compliance issues...  ~  labs 3/08 showed TSH=20.5 on Synthroid 122mcg/d and dose increased to 200. ~  labs 9/09 showed TSH= 6.65 on 25mcg/d... continue same med. ~  ran out of meds Feb10 and didn't refill- labs 3/10 showed TSH= 93... rec> restart LEVOTHY200. ~  labs 4/11 showed TSH= 0.61... clinically euthyroid, may need to cut back dose. ~  Labs 6/12 showed TSH= 1.09 ~  Labs 7/13 on Synthroid200 showed TSH= 0.19 & we decided to decr the Synthroid to 113mcg/d... ~  2/14: he needs recheck fasting labs soon...  OVERWEIGHT (ICD-278.02) - since we first met Paul Shannon in 1997 his weight has been 220-230 range... occas down to 210-215, and once down to 205# in 2001... highest weight has been 232#.[name]. ~  weight 3/10 = 230# ~  weight 4/11 = 228# ~  Weight 6/12 = 221#... This was after his diverticulitis episode. ~  Weight 7/13 = 235# ~  Weight 2/14 = 202#... s/p Lap Band surg 1/14.  GERD (ICD-530.81) - uses OTC Prilosec as needed. ~  EGD 3/13 by DrKaplan showed a benign nodule in the distal esoph, otherw neg...  DIVERTICULITIS>  Adm to hosp 5/12 w/ acute sigmoid diverticulitis;  treated w/ Cipro & Flagyl & slowly improved; CT Abd revealed acute diverticulitis in prox sigmoid w/ perforation & pericolonic inflamm, and fatty liver disease; CCS consult by DrCornett w/ rec for med rx & no surg intervention; he has a post hosp follow up appt w/  DrKaplan ==> pending 6/12 ~  Colonoscopy 6/12 by DrKaplan showed mod divertics in the sigmoid, otherw neg & f/u planned 18yrs...  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 he uses OTC PrepH.  FATTY LIVER DISEASE (ICD-571.8) -  he has hepatic steatosis w/ prev GI eval 1999 by [facility] including a liver Bx... there is a pos family hx of cirrhosis  in his father who died from this disease... he has been counselled on weight reduction and told to avoid hepatotoxins including alcohol...  he has discussed Hepatic Steatosis/ Fatty Liver Disease w/ GI, and we have provided him w/ mult hand-out about this condition...  he understands that it can lead to cirrhosis and death from liver failure... he understands that he must get his weight down, control his fat intake, and follow the FLP & LFT's carefully... ~  LFTs 3/08 showed SGOT= 61, SGPT= 100... rec> get wt down. ~  LFTs 9/09 showed SGOT= 43, SGPT= 62... improved. ~  LFTs 2/10 showed SGOT= 29, SGPT= 39... normal! ~  LFTs 4/11 showed SGOT= 60, SGPT= 97... must diet & get wt down! 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 ~  LFTs 6/13 showed SGOT= 121, SGPT= 164... Pt referred back to GI for his severe hepatic steatosis... ~  LFTs 1/14 s/p Lap Band surg for his steatohepatitis showed SGOT=47, SGPT=72...   Small lesion in the skin on the underside of the scrotum >> presented 2/14 w/ this superfic infection; treated w/ Sitz, Neosporin, SeptraDSBid...  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  ANXIETY (ICD-300.00) - uses Alprazolam Prn...  DERM - mult tattoos...  Health Maintenance -  ~  GU:  age 75, PSA checked 2/10 due to symptoms and normal at 0.85... ~  GI:  had FlexSig in 1998 for IBS symptoms by [facility] & it  was neg... GI f/u pending w/ DrKaplan s/p diverticulitis. ~  Immunizations:  ? last Tetanus shot (he agrees to TDAP today)... hasn't had Pneumovax, Flu shots (refuses), or HepB series...  << NOTE: GIVEN COMBINED HEP A & HEP B VACCINE BY [facility] 7-8/13 >>   Past Surgical History  Procedure Laterality Date  . Knee surgery  2011    right knee   . Liver biopsy    . Colonoscopy    . Breath tek h pylori  10/02/2012    Procedure: BREATH TEK H PYLORI;  Surgeon: Kandis Cocking, MD;  Location: [facility];  Service: General;  Laterality: N/A;  . Tonsillectomy and adenoidectomy  as child  . Cholecystectomy  05/11/2012  . Laparoscopic gastric banding  12/25/2012    Procedure: LAPAROSCOPIC GASTRIC BANDING;  Surgeon: Kandis Cocking, MD;  Location: [facility];  Service: General;  Laterality: N/A;  Lap Band Placement  . Mesh applied to lap port  12/25/2012    Procedure: MESH APPLIED TO LAP PORT;  Surgeon: Kandis Cocking, MD;  Location: [facility];  Service: General;;  . Liver biopsy  12/25/2012    Procedure: LIVER BIOPSY;  Surgeon: Kandis Cocking, MD;  Location: [facility];  Service: General;;    Outpatient Encounter Prescriptions as of 01/21/2013  Medication Sig Dispense Refill  . aspirin 81 MG tablet Take 81 mg by mouth daily with breakfast.       . Cyanocobalamin (VITAMIN B 12 PO) Take 1 tablet by mouth daily.      [name] dicyclomine (BENTYL) 20 MG tablet Take 20 mg by mouth daily as needed. For stomach      . levothyroxine (SYNTHROID, LEVOTHROID) 175 MCG tablet Take 175 mcg by mouth every morning.      . Multiple Vitamin (MULTIVITAMIN) capsule Take 1 capsule by mouth daily.       [name]  potassium chloride SA (K-DUR,KLOR-CON) 20 MEQ tablet Take 1 tablet (20 mEq total) by mouth 2 (two) times daily.  180 tablet  3  . losartan-hydrochlorothiazide (HYZAAR) 100-25 MG per tablet Take 1 tablet by mouth every morning.       No facility-administered encounter medications on file as of 01/21/2013.    Allergies  Allergen  Reactions  . Niacin     REACTION: pt states INTOL w/ flushing  . Other     Red dye=rash   . Penicillins     REACTION: childhood - extreme rash  . Rosuvastatin     REACTION: pt states INTOL    Current Medications, Allergies, Past Medical History, Past Surgical History, Family History, and Social History were reviewed in [employer] record.   Review of Systems        The patient complains of joint pain and paresthesias.  The patient denies fever, chills, sweats, anorexia, fatigue, weakness, malaise, weight loss, sleep disorder, blurring, diplopia, eye irritation, eye discharge, vision loss, eye pain, photophobia, earache, ear discharge, tinnitus, decreased hearing, nasal congestion, nosebleeds, sore throat, hoarseness, chest pain, palpitations, syncope, dyspnea on exertion, orthopnea, PND, peripheral edema, cough, dyspnea at rest, excessive sputum, hemoptysis, wheezing, pleurisy, nausea, vomiting, diarrhea, constipation, change in bowel habits, abdominal pain, melena, hematochezia, jaundice, gas/bloating, indigestion/heartburn, dysphagia, odynophagia, dysuria, hematuria, urinary frequency, urinary hesitancy, nocturia, incontinence, back pain, joint swelling, muscle cramps, muscle weakness, stiffness, arthritis, sciatica, restless legs, leg pain at night, leg pain with exertion, rash, itching, dryness, suspicious lesions, paralysis, seizures, tremors, vertigo, transient blindness, frequent falls, frequent headaches, difficulty walking, depression, anxiety, memory loss, confusion, cold intolerance, heat intolerance, polydipsia, polyphagia, polyuria, unusual weight change, abnormal bruising, bleeding, enlarged lymph nodes, urticaria, allergic rash, hay fever, and recurrent infections.     Objective:   Physical Exam      WD, Overweight, 47 y/o WM in NAD... GENERAL:  Alert & oriented; pleasant & cooperative... HEENT:  [place]/AT, EOM-wnl, PERRLA, EACs-clear, TMs-wnl, NOSE-clear,  THROAT-clear & wnl... NECK:  Supple w/ full ROM; no JVD; normal carotid impulses w/o bruits; no thyromegaly or nodules palpated; no lymphadenopathy... CHEST:  Clear to P & A; without wheezes/ rales/ or rhonchi heard... HEART:  Regular Rhythm; without murmurs/ rubs/ or gallops detected... ABDOMEN:  Soft & min tender LLQ; normal bowel sounds; no organomegaly or masses palpated... [place]: sm reddish knot on skin, sl thickened, no drainage, testes ok, penis ok, no regional LNs....  RECTAL:  neg- prostate 2+ normal, stool heme neg, few ext hem's noted... EXT: without deformities, mild arthritic changes;   NEURO:  CN's intact; motor testing normal; sensory testing normal; gait normal & balance OK. DERM:  No lesions noted; no rash, several tatoos...   Assessment & Plan:    Scrotal lesion >> seems like superfic infection & we will treat w/ Sitz, Neosporin, SeptraDS...   HBP>  Controlled off Hyzaar w/ his wt loss... Keep up the good work.  CHOL>  Improved w/  wt reduction after the diverticulitis, advised to keep up the wt reduction efforts & low fat diet...  HYPOTHYROID>  Stable on the Synthroid 171mcg/d dose, clinically & biochem euthyroid...  OBESITY>  He is now s/p Lap Band surg 1/14 by drNewman; wt already down 30# & he feels much better...  GI>  GERD/ Divertics/ IBS/ Fatty Liver>  Followed by Dorris Singh for GI...  DIVERTICULITIS>  AS ABOVE- acute diverticulitis 5/12 w/ perf & pericolonic inflamm, improved w/ antibiotic Rx & f/u colon 6/12 was otherw neg.Marland KitchenMarland Kitchen  NAFLD>  This is his most serious medical condition w/ liver bx proving marked progression over the last 10+yrs; his father died from this disease; he had bariatric surg 1/14 7 has lost >30#, feeling better & LFTs improved... We will continue to follow...  DJD> s/p right knee arthroscopy by DrWainer 10/11...   Patient's Medications  New Prescriptions   No medications on file  Previous Medications   ASPIRIN 81 MG TABLET    Take 81  mg by mouth daily with breakfast.    CYANOCOBALAMIN (VITAMIN B 12 PO)    Take 1 tablet by mouth daily.   DICYCLOMINE (BENTYL) 20 MG TABLET    Take 20 mg by mouth daily as needed. For stomach   LEVOTHYROXINE (SYNTHROID, LEVOTHROID) 175 MCG TABLET    Take 175 mcg by mouth every morning.   LOSARTAN-HYDROCHLOROTHIAZIDE (HYZAAR) 100-25 MG PER TABLET    Take 1 tablet by mouth every morning.   MULTIPLE VITAMIN (MULTIVITAMIN) CAPSULE    Take 1 capsule by mouth daily.    POTASSIUM CHLORIDE SA (K-DUR,KLOR-CON) 20 MEQ TABLET    Take 1 tablet (20 mEq total) by mouth 2 (two) times daily.  Modified Medications   No medications on file  Discontinued Medications   No medications on file

## 2013-01-26 ENCOUNTER — Other Ambulatory Visit: Payer: Self-pay

## 2013-01-26 ENCOUNTER — Encounter: Payer: Self-pay | Admitting: Pulmonary Disease

## 2013-01-28 ENCOUNTER — Other Ambulatory Visit (INDEPENDENT_AMBULATORY_CARE_PROVIDER_SITE_OTHER): Payer: BC Managed Care – PPO

## 2013-01-28 ENCOUNTER — Other Ambulatory Visit: Payer: Self-pay | Admitting: Pulmonary Disease

## 2013-01-28 DIAGNOSIS — Z0000 Encounter for general adult medical examination without abnormal findings: Secondary | ICD-10-CM

## 2013-01-28 LAB — CBC WITH DIFFERENTIAL/PLATELET
Basophils Absolute: 0 10*3/uL (ref 0.0–0.1)
Eosinophils Relative: 12.1 % — ABNORMAL HIGH (ref 0.0–5.0)
Hemoglobin: 15.1 g/dL (ref 13.0–17.0)
Lymphocytes Relative: 40.9 % (ref 12.0–46.0)
Monocytes Relative: 9.6 % (ref 3.0–12.0)
Neutro Abs: 1.5 10*3/uL (ref 1.4–7.7)
RDW: 13.9 % (ref 11.5–14.6)
WBC: 4 10*3/uL — ABNORMAL LOW (ref 4.5–10.5)

## 2013-01-28 LAB — BASIC METABOLIC PANEL
BUN: 13 mg/dL (ref 6–23)
CO2: 29 mEq/L (ref 19–32)
Calcium: 9.4 mg/dL (ref 8.4–10.5)
Creatinine, Ser: 1.1 mg/dL (ref 0.4–1.5)
GFR: 76.59 mL/min (ref >60.00)
Glucose, Bld: 74 mg/dL (ref 70–99)
Potassium: 4.4 mEq/L (ref 3.5–5.1)
Sodium: 140 mEq/L (ref 135–145)

## 2013-01-28 LAB — HEPATIC FUNCTION PANEL
Albumin: 4.3 g/dL (ref 3.5–5.2)
Alkaline Phosphatase: 52 U/L (ref 39–117)
Total Protein: 7.3 g/dL (ref 6.0–8.3)

## 2013-01-28 LAB — TSH: TSH: 112.56 u[IU]/mL — ABNORMAL HIGH (ref 0.35–5.50)

## 2013-01-28 LAB — LIPID PANEL
LDL Cholesterol: 69 mg/dL (ref 0–99)
Total CHOL/HDL Ratio: 2

## 2013-01-30 ENCOUNTER — Ambulatory Visit (INDEPENDENT_AMBULATORY_CARE_PROVIDER_SITE_OTHER): Payer: BC Managed Care – PPO | Admitting: Pulmonary Disease

## 2013-01-30 ENCOUNTER — Other Ambulatory Visit (INDEPENDENT_AMBULATORY_CARE_PROVIDER_SITE_OTHER): Payer: BC Managed Care – PPO

## 2013-01-30 VITALS — BP 108/70 | HR 68 | Temp 98.2°F | Ht 70.0 in | Wt 198.6 lb

## 2013-01-30 DIAGNOSIS — E039 Hypothyroidism, unspecified: Secondary | ICD-10-CM

## 2013-01-30 DIAGNOSIS — E7800 Pure hypercholesterolemia, unspecified: Secondary | ICD-10-CM

## 2013-01-30 NOTE — Patient Instructions (Addendum)
Today we updated your med list in our EPIC system...     Congrats on your wt loss & improvement after the lap band surg...  Today we reviewed your recent FASTING blood work...    We decided to decrease the HYZAAR 100-25 to 1/2 tab daily..    We decided to decrease the K20 to one tab daily...    Please monitor your BP at home & call if BP is 150/90 or higher otherw we will stay on this 50-12.5 dose...     It is hard to understand why your TSH would be so abnormal & your function so under-replaced on the Levothy230mcg dose daily...       We repeated the blood work today & we will call you w/ the report & decide what to do...  Call for any questions...  Let's plan a f/u office visit in 6 months but we'll be checking labs inbetween.Marland KitchenMarland Kitchen

## 2013-01-31 ENCOUNTER — Encounter: Payer: Self-pay | Admitting: Pulmonary Disease

## 2013-01-31 NOTE — Progress Notes (Signed)
Subjective:     Patient ID: Paul Shannon, male   DOB: 1966-02-05, 47 y.o.   MRN: [id number]  HPI  Review of Systems    Physical Exam        Subjective:    Patient ID: Paul Shannon, male    DOB: May 03, 1966, 47 y.o.   MRN: [id number]  HPI 47 y/o WM here for a follow up visit & CPX... he has multiple medical problems as noted below...   ~  Paul Shannon:  29mo ROV & post hosp visit> He was hospitalized 5/12 w/ acute diverticulitis, treated w/ Cipro & Flagyl & slowly improved; CT Abd revealed acute diverticulitis in prox sigmoid w/ perforation & pericolonic inflamm, and fatty liver disease; CCS consult by DrCornett w/ rec for med rx & no surg intervention; he has a post hosp follow up appt w/ DrKaplan soon...     Medically [name] has been OK> BP controlled on Losartan; note- CXR & EKG were not done during his recent hosp...    We did f/u Fasting labs and FLP looks good on diet alone now x for low HDL at 30 & advised incr exercise, get wt down; despite the hosp he hasn't lost much wt.    Clinically euthyroid on large dose of Synthroid & TSH= 1.09    He has some DJD knees and he right knee arthroscopy 10/11 by DrWainer...  ~  Paul 2. Shannon:  1 year ROV & CPX> Paul Shannon developed abd pain 3/13 & had eval DrKaplan w/ an abn hepatobiliary scan showing an abn low ejection fraction of 2% (norm>30%); He had elev LFTs as well but this was felt to be more likely related to his known Hepatic Steatosis; he was referred to [facility] for Cholecystectomy & intraoperative liver biopsy to further investigate the status of his liver disease (recall hx of father passing away w/ cirrhosis due to this condition) as his last bx was in the 13's;  He did well w/ surgery & had neg intraop cholangiogram, & an uneventful recovery;  Liver Bx however showed progressive HepSteatosis- 70% steatosis & total NASH score of 8 (out of poss 9);  He has a follow up appt w/ DrKaplan to consider his options as he is one sm step away from  cirrhosis at this juncture & Bariatric surg may be an option...    We reviewed prob list, meds, xrays and labs> see below for updates>> LABS 5/13 in hosp:  Elev LFTs as noted;  CBC- ok  LABS 7/13:  FLP- ok on diet alone;  Chems- ok x persist incr LFTs due to steatosis;  TSH= 0.19 on Synth200 & we decided to slowly wean...  ~  Paul Shannon:  22mo ROV & Paul Shannon is s/p Lap Band surg for his Hepatic Steatosis & pos FamHx cirrhosis from this condition (his insurance wouldn't cover a gastric procedure); he has already lost over 30# down to 202# today & he says "I never would have known how bad I really felt til I could compare to how good I feel now" and he states he is now eating just 10% of what he ate before...    Presents w/ small knot on scrotum- ?local skin infection, not very severe, no regional lymphadenopathy, etc; we discussed Rx w/ sitz baths, Neosporin, course of empiric SeptraDSBid (allergy to PCN); to Urology if this is not resolving...    Requesting Rx for Hem's as well> we discussed sitz baths, Miralax/ Senakot, AnusolHC cream as needed.Marland KitchenMarland Kitchen  We reviewed prob list, meds, xrays and labs> see below for updates >>   ~  Paul Shannon:  Annual CPX> Paul Shannon's scrotal lesion has resolved w/ Sitz, topical Rx & SeptraDS... No new complaints or concerns, we reviewed the following medical problems during today's office visit >>     HBP> on ASA81, Hyzaar100-25, K20Bid; BP= 108/70 assoc w/ his wt reduction of 36# total so far; he denies CP, palpit, SOB, edema; we decided to decr the Hyzaar to 1/2 tab daily & cut the K20 to one daily...    Lipids> on diet alone; wt is down as noted after LapBand surg; FLP shows TChol 135, TG 55, HDL 55, LDL 69    Hypothyroid> on Levothyroid daily; hx of suspect med compliance in past; clinically & biochem euthyroid but TSH 2/14 = 112, he states taking it every day, call to pharm indicates a lapse in Rx but filled #90 12/09/12, didn't bring bottles; recheck TSH= 66 & I  called DrEllison (he agreed that compliance is suspect); Rec to take the pill every day 1st thing in the AM on empty stomach & don't eat for 1h...    Overweight> he had Lap Band surg 12/26/12 by [facility]; doing much better by his history & he est that he is eating 10% of what he ate before; wt is down 36# from our peak to 199# today...    GI-GERD, Divertics, IBS, Hems, s/p GB> on AnusolHC cream as needed; not requiring PPI rx etc; he denies abd pain, n/v, c/d, blood seen...    Fatty Liver Dis> DrNewman mentioned that liver looked some better at the time of his surg; LFTs much improved & now WNL.[name].    DJD, right knee discomfort> hx right knee arthroscopy 10/11 by DrWainer; stable 7 he avoids NSAIDs...     Anxiety> prev on alpraz0.5 prn but he has been off all meds for some time- doing well w/o anxiety symptoms... We reviewed prob list, meds, xrays and labs> see below for updates >>  LABS 2/14:  FLP- at goals on diet alone;  Chems- wnl & LFTs now normal;  CBC- wnl;  TSH=112 & repeat=66 on Levothy216mcg/d > asked to take it every AM, on empty stomach, don't eat for 1H, etc...         Problem List:  HYPERTENSION (ICD-401.9) - controlled on HYZAAR 100-25 daily... BP=116/76 today, & he states OK at home ~130's to 140/ 80's-90... denies HA, fatigue, visual changes, CP, palipit, dizziness, syncope, dyspnea, edema, etc...  ~  CXR 4/11 showed low lung vol's but clear, normal Cor, NAD. ~  EKG showed NSR rate 60, no signif STTWA's, WNL. ~  CXR 5/13 showed normal heart size, clear lungs, NAD... ~  2/14: on ASA81, Hyzaar100-25, K20Bid; BP= 108/70 assoc w/ his wt reduction of 36# total so far; he denies CP, palpit, SOB, edema; we decided to decr the Hyzaar to 1/2 tab daily & cut the K20 to one daily...  HYPERCHOLESTEROLEMIA (ICD-272.0) - he has refused meds preferring to attempt control on diet alone... he was intol to Niacin in past w/ flushing... intol to Crestor10 when tried in Sep09... ~  FLP 3/08  showed TChol 143, TG 190, HDL 30, LDL 75... rec to start fibrate- he declined meds. ~  FLP 9/09 showed TChol 143, TG 123, HDL 20, LDL 99... rec to start Crestor10mg /d- he agrees. ~  pt states that he didn't tolerate the Crestor10 either, therefore stopped it. ~  FLP 4/11 showed  TChol 130, TG 94, HDL 33, LDL 79... much improved, on diet alone. ~  FLP 6/12 on diet alone (after divertic episode) showed TChol 131, TG 112, HDL 30, LDL 78... rec incr exercise. ~  FLP 7/13 on diet alone showed TChol 123, TG 123, HDL 34, LDL 65 ~  FLP 2/14 on diet alone w/ 36# wt reduction so far showed TChol 135, TG 55, HDL 55, LDL 69  HYPOTHYROIDISM (ICD-244.9) - on SYNTHROID 247mcg/d w/ ?compliance issues...  ~  labs 3/08 showed TSH=20.5 on Synthroid 152mcg/d and dose increased to 200. ~  labs 9/09 showed TSH= 6.65 on 245mcg/d... continue same med. ~  ran out of meds Feb10 and didn't refill- labs 3/10 showed TSH= 93... rec> restart LEVOTHY200. ~  labs 4/11 showed TSH= 0.61... clinically euthyroid, may need to cut back dose. ~  Labs 6/12 showed TSH= 1.09 ~  Labs 7/13 on Synthroid200 showed TSH= 0.19 & we decided to decr the Synthroid to 138mcg/d... ~  Labs 2/14 on Synthroid200 showed TSH=112, repeat=66; we discussed taking med every day, 1st thing in the AM on empty stomach, don't eat for 1h; I discussed this by phone w/ drEllison & he concurred that med compliace was the most likely explanation...  OVERWEIGHT (ICD-278.02) - since we first met Giles in 1997 his weight has been 220-230 range... occas down to 210-215, and once down to 205# in 2001... highest weight has been 232#.[name]. ~  weight 3/10 = 230# ~  weight 4/11 = 228# ~  Weight 6/12 = 221#... This was after his diverticulitis episode. ~  Weight 7/13 = 235# ~  Weight 2/14 = 202#... s/p Lap Band surg 1/14. ~  Weight 2/14 = 199#  GERD (ICD-530.81) - uses OTC Prilosec as needed. ~  EGD 3/13 by DrKaplan showed a benign nodule in the distal esoph, otherw  neg...  DIVERTICULITIS>  Adm to hosp 5/12 w/ acute sigmoid diverticulitis;  treated w/ Cipro & Flagyl & slowly improved; CT Abd revealed acute diverticulitis in prox sigmoid w/ perforation & pericolonic inflamm, and fatty liver disease; CCS consult by DrCornett w/ rec for med rx & no surg intervention; he has a post hosp follow up appt w/ DrKaplan ==> pending 6/12 ~  Colonoscopy 6/12 by DrKaplan showed mod divertics in the sigmoid, otherw neg & f/u planned 56yrs...  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 he uses OTC PrepH.  FATTY LIVER DISEASE (ICD-571.8) -  he has hepatic steatosis w/ prev GI eval 1999 by [facility] including a liver Bx... there is a pos family hx of cirrhosis in his father who died from this disease... he has been counselled on weight reduction and told to avoid hepatotoxins including alcohol...  he has discussed Hepatic Steatosis/ Fatty Liver Disease w/ GI, and we have provided him w/ mult hand-out about this condition...  he understands that it can lead to cirrhosis and death from liver failure... he understands that he must get his weight down, control his fat intake, and follow the FLP & LFT's carefully... ~  LFTs 3/08 showed SGOT= 61, SGPT= 100... rec> get wt down. ~  LFTs 9/09 showed SGOT= 43, SGPT= 62... improved. ~  LFTs 2/10 showed SGOT= 29, SGPT= 39... normal! ~  LFTs 4/11 showed SGOT= 60, SGPT= 97... must diet & get wt down! ~  LFTs 6/12 showed SGOT= 37, SGPT= 55 ~  LFTs 3/13 showed SGOT= 75, SGPT= 112 ~  S/p lap cholecystectomy for  biliary dyskinesia & liver bx> severe Hepatic Steatosis w/ NASH score 8/9... ~  LFTs 6/13 showed SGOT= 121, SGPT= 164... Pt referred back to GI for his severe hepatic steatosis... ~  LFTs 1/14 s/p Lap Band surg for his steatohepatitis showed SGOT=47, SGPT=72...  ~  LFTs 2/14 (wt=199#) showed SGOT=38, SGPT=34; much improved...  Small lesion in the skin on the underside of the scrotum  >> presented 2/14 w/ this superfic infection; treated w/ Sitz, Neosporin, SeptraDSBid... ~  2/14: pt reports much better & lesion resolved on Rx...  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  ANXIETY (ICD-300.00) - uses Alprazolam Prn...  DERM - mult tattoos...  Health Maintenance -  ~  GU:  age 89, PSA checked 2/10 due to symptoms and normal at 0.85... ~  GI:  had FlexSig in 1998 for IBS symptoms by [facility] & it was neg... GI f/u pending w/ DrKaplan s/p diverticulitis. ~  Immunizations:  ? last Tetanus shot (he agrees to TDAP today)... hasn't had Pneumovax, Flu shots (refuses), or HepB series...  << NOTE: GIVEN COMBINED HEP A & HEP B VACCINE BY [facility] 7-8/13 >>   Past Surgical History  Procedure Laterality Date  . Knee surgery  2011    right knee   . Liver biopsy    . Colonoscopy    . Breath tek h pylori  10/22/Shannon    Procedure: BREATH TEK H PYLORI;  Surgeon: Kandis Cocking, MD;  Location: [facility];  Service: General;  Laterality: N/A;  . Tonsillectomy and adenoidectomy  as child  . Cholecystectomy  05/31/Shannon  . Laparoscopic gastric banding  1/14/Shannon    Procedure: LAPAROSCOPIC GASTRIC BANDING;  Surgeon: Kandis Cocking, MD;  Location: [facility];  Service: General;  Laterality: N/A;  Lap Band Placement  . Mesh applied to lap port  1/14/Shannon    Procedure: MESH APPLIED TO LAP PORT;  Surgeon: Kandis Cocking, MD;  Location: [facility];  Service: General;;  . Liver biopsy  1/14/Shannon    Procedure: LIVER BIOPSY;  Surgeon: Kandis Cocking, MD;  Location: [facility];  Service: General;;    Outpatient Encounter Prescriptions as of 2/19/Shannon  Medication Sig Dispense Refill  . aspirin 81 MG tablet Take 81 mg by mouth daily with breakfast.       . Cyanocobalamin (VITAMIN B 12 PO) Take 1 tablet by mouth daily.      [name] dicyclomine (BENTYL) 20 MG tablet Take 20 mg by mouth daily as needed. For stomach      . hydrocortisone (ANUSOL-HC) 2.5 % rectal cream  Place rectally 2 (two) times daily.  30 g  prn  . levothyroxine (SYNTHROID, LEVOTHROID) 200 MCG tablet Take 200 mcg by mouth daily.      [name] losartan-hydrochlorothiazide (HYZAAR) 100-25 MG per tablet Take 1/2 tablet by mouth daily      . Multiple Vitamin (MULTIVITAMIN) capsule Take 1 capsule by mouth daily.       . potassium chloride SA (K-DUR,KLOR-CON) 20 MEQ tablet Take 20 mEq by mouth daily.      [name] sulfamethoxazole-trimethoprim (SEPTRA DS) 800-160 MG per tablet Take 1 tablet by mouth 2 (two) times daily.  20 tablet  0  . [DISCONTINUED] potassium chloride SA (K-DUR,KLOR-CON) 20 MEQ tablet Take 1 tablet (20 mEq total) by mouth 2 (two) times daily.  180 tablet  3  . [DISCONTINUED] levothyroxine (SYNTHROID, LEVOTHROID) 175 MCG tablet Take 175 mcg by mouth every morning.  No facility-administered encounter medications on file as of 2/19/Shannon.    Allergies  Allergen Reactions  . Niacin     REACTION: pt states INTOL w/ flushing  . Other     Red dye=rash   . Penicillins     REACTION: childhood - extreme rash  . Rosuvastatin     REACTION: pt states INTOL    Current Medications, Allergies, Past Medical History, Past Surgical History, Family History, and Social History were reviewed in [employer] record.   Review of Systems        The patient complains of joint pain and paresthesias.  The patient denies fever, chills, sweats, anorexia, fatigue, weakness, malaise, weight loss, sleep disorder, blurring, diplopia, eye irritation, eye discharge, vision loss, eye pain, photophobia, earache, ear discharge, tinnitus, decreased hearing, nasal congestion, nosebleeds, sore throat, hoarseness, chest pain, palpitations, syncope, dyspnea on exertion, orthopnea, PND, peripheral edema, cough, dyspnea at rest, excessive sputum, hemoptysis, wheezing, pleurisy, nausea, vomiting, diarrhea, constipation, change in bowel habits, abdominal pain, melena, hematochezia, jaundice,  gas/bloating, indigestion/heartburn, dysphagia, odynophagia, dysuria, hematuria, urinary frequency, urinary hesitancy, nocturia, incontinence, back pain, joint swelling, muscle cramps, muscle weakness, stiffness, arthritis, sciatica, restless legs, leg pain at night, leg pain with exertion, rash, itching, dryness, suspicious lesions, paralysis, seizures, tremors, vertigo, transient blindness, frequent falls, frequent headaches, difficulty walking, depression, anxiety, memory loss, confusion, cold intolerance, heat intolerance, polydipsia, polyphagia, polyuria, unusual weight change, abnormal bruising, bleeding, enlarged lymph nodes, urticaria, allergic rash, hay fever, and recurrent infections.     Objective:   Physical Exam      WD, Overweight, 47 y/o WM in NAD... GENERAL:  Alert & oriented; pleasant & cooperative... HEENT:  [place]/AT, EOM-wnl, PERRLA, EACs-clear, TMs-wnl, NOSE-clear, THROAT-clear & wnl... NECK:  Supple w/ full ROM; no JVD; normal carotid impulses w/o bruits; no thyromegaly or nodules palpated; no lymphadenopathy... CHEST:  Clear to P & A; without wheezes/ rales/ or rhonchi heard... HEART:  Regular Rhythm; without murmurs/ rubs/ or gallops detected... ABDOMEN:  Soft & min tender LLQ; normal bowel sounds; no organomegaly or masses palpated... [place]: sm reddish knot on skin, sl thickened, no drainage, testes ok, penis ok, no regional LNs....  RECTAL:  neg- prostate 2+ normal, stool heme neg, few ext hem's noted... EXT: without deformities, mild arthritic changes;   NEURO:  CN's intact; motor testing normal; sensory testing normal; gait normal & balance OK. DERM:  No lesions noted; no rash, several tatoos...   Assessment & Plan:    HBP>  Controlled on Hyzaar w/ his wt loss; we decided to decr the dose to 1/2 tab & one K20/d...  CHOL>  Improved w/  wt reduction, advised to keep up the wt reduction efforts & low fat diet...  HYPOTHYROID>  Stable on the Synthroid 253mcg/d dose,  clinically & biochem euthyroid but compliance is suspect; asked to take Levothy200 1st thing in the AM, don't eat for 1h, & bring all bottles to office for review...  OBESITY>  He is now s/p Lap Band surg 1/14 by [facility]; wt already down 36# & he feels much better...  GI>  GERD/ Divertics/ IBS/ Fatty Liver>  Followed by Dorris Singh for GI...  DIVERTICULITIS>  AS ABOVE- acute diverticulitis 5/12 w/ perf & pericolonic inflamm, improved w/ antibiotic Rx & f/u colon 6/12 was otherw neg...  NAFLD>  This is his most serious medical condition w/ liver bx proving marked progression over the last 10+yrs; his father died from this disease; he had bariatric surg 1/14 7 has  lost >30#, feeling better & LFTs improved... We will continue to follow...  DJD> s/p right knee arthroscopy by DrWainer 10/11...   Patient's Medications  New Prescriptions   No medications on file  Previous Medications   ASPIRIN 81 MG TABLET    Take 81 mg by mouth daily with breakfast.    CYANOCOBALAMIN (VITAMIN B 12 PO)    Take 1 tablet by mouth daily.   DICYCLOMINE (BENTYL) 20 MG TABLET    Take 20 mg by mouth daily as needed. For stomach   HYDROCORTISONE (ANUSOL-HC) 2.5 % RECTAL CREAM    Place rectally 2 (two) times daily.   LEVOTHYROXINE (SYNTHROID, LEVOTHROID) 200 MCG TABLET    Take 200 mcg by mouth daily.   LOSARTAN-HYDROCHLOROTHIAZIDE (HYZAAR) 100-25 MG PER TABLET    Take 1/2 tablet by mouth daily   MULTIPLE VITAMIN (MULTIVITAMIN) CAPSULE    Take 1 capsule by mouth daily.    SULFAMETHOXAZOLE-TRIMETHOPRIM (SEPTRA DS) 800-160 MG PER TABLET    Take 1 tablet by mouth 2 (two) times daily.  Modified Medications   Modified Medication Previous Medication   POTASSIUM CHLORIDE SA (K-DUR,KLOR-CON) 20 MEQ TABLET potassium chloride SA (K-DUR,KLOR-CON) 20 MEQ tablet      Take 20 mEq by mouth daily.    Take 1 tablet (20 mEq total) by mouth 2 (two) times daily.  Discontinued Medications   LEVOTHYROXINE (SYNTHROID, LEVOTHROID) 175 MCG TABLET     Take 175 mcg by mouth every morning.

## 2013-02-14 ENCOUNTER — Telehealth: Payer: Self-pay | Admitting: Pulmonary Disease

## 2013-02-14 MED ORDER — AZITHROMYCIN 250 MG PO TABS: ORAL_TABLET | ORAL | Status: DC

## 2013-02-14 NOTE — Telephone Encounter (Signed)
Per Dr. Kriste Basque ok for patient to have Z pack.  Spoke with patient, patient aware rx has been sent in. Nothing further needed at this time.  [employer]

## 2013-02-14 NOTE — Telephone Encounter (Signed)
Spoke with patient, stated he feels he getting a sinus infection Pressure and nasal drainage since this morning. States he usually gets these symptoms this time of the year and takes a Z Pack. Would like a Rx/Rec from Dr. Kriste Basque before symptoms "knock him out"  Paul Shannon on [employer] [name]  Last OV: 01/30/13 with 6 month follow up (not scheduled at this time)  Allergies  Allergen Reactions  . Niacin     REACTION: pt states INTOL w/ flushing  . Other     Red dye=rash   . Penicillins     REACTION: childhood - extreme rash  . Rosuvastatin     REACTION: pt states INTOL

## 2013-02-26 ENCOUNTER — Ambulatory Visit: Payer: BC Managed Care – PPO | Admitting: *Deleted

## 2013-03-01 ENCOUNTER — Ambulatory Visit: Payer: BC Managed Care – PPO | Admitting: *Deleted

## 2013-03-06 ENCOUNTER — Encounter (INDEPENDENT_AMBULATORY_CARE_PROVIDER_SITE_OTHER): Payer: BC Managed Care – PPO | Admitting: Surgery

## 2013-03-07 ENCOUNTER — Ambulatory Visit (INDEPENDENT_AMBULATORY_CARE_PROVIDER_SITE_OTHER): Payer: PRIVATE HEALTH INSURANCE | Admitting: Surgery

## 2013-03-07 ENCOUNTER — Encounter (INDEPENDENT_AMBULATORY_CARE_PROVIDER_SITE_OTHER): Payer: Self-pay | Admitting: Surgery

## 2013-03-07 VITALS — BP 127/77 | HR 64 | Temp 97.4°F | Ht 70.0 in | Wt 189.4 lb

## 2013-03-07 DIAGNOSIS — Z9884 Bariatric surgery status: Secondary | ICD-10-CM

## 2013-03-07 DIAGNOSIS — K7689 Other specified diseases of liver: Secondary | ICD-10-CM

## 2013-03-07 NOTE — Progress Notes (Signed)
Re:   Paul Shannon DOB:   05-28-1966 MRN:   [id number]  ASSESSMENT AND PLAN: 1.  Morbid obesity - weight 228, BMI - 32.7    Lap Band, APS - 12/25/2012  Doing very well.  Did not fill lap band.  Will see back  2.  Esophageal nodule - benign squamous papilloma. 3.  History of diverticulitis. 4.  Hypertension 5.  Seasonal allergies 6.  History of kidney stones. 7.  Questionable right inguinal hernia.  Does not really bother him. 8.  On thyroid replacement.  For about 10 years.  TSH - 0.19 - 06/12/2012. 9.  Liver with 70% steatosis changes. Biopsy done during his lap chole on 05/11/2012.  He has a NASH score of 8.   Significant improvement of liver disease on most recent laparoscopy/liver biopsy - 12/25/2012  [photos attached at end of progress note] 10.  Biliary dyskinesia - S/P lap chole - 05/11/2012 - D. Maison Kestenbaum 11. Pruritis ani  He has minimal hemorrhoids -  But I think this is more local inflammation.  Plan to continue what Dr. Kriste Basque has initiated.  Topical ointment, plenty of liquids, high fiber, sitz baths.  Chief Complaint  Patient presents with  . Bariatric Pre-op  . Rectal Problems   REFERRING PHYSICIAN:  Dr. Darrel Hoover  HISTORY OF PRESENT ILLNESS: Paul Shannon is a 47 y.o. (DOB: March 18, 1966)  white male whose primary care physician is [name],[name], MD and comes to me follow for follow up of lap band placement.  The patient is doing well. He has some resistance was Lap band. He had one episode of vomiting.  He's had to buy all new clothing.  His main complaint has been itching and burning around his rectum.  He has talked to Dr. Kriste Basque about this and started treatment, but has not had much improvement.  He had a colonoscopy about 2 years ago.  He had a blister around his rectum and does notice the occassional blood.  Weight loss/Liver history: He understands the various options of weight loss surgery.  He has tried to diet, mainly limiting sugar intake and carbohydrate intake,  and has had some modest success.  His father died in his 31's from cirrhosis, so this is driving him to consider weight loss surgery.  Much of the impetus for his interest in bariatric surgery is the relation of his weight to his liver disease.  He realizes the seriousness of progressive liver disease.  He said he has letters of support for bariatric surgery from Drs. Kriste Basque and Arlyce Dice.    Past Medical History  Diagnosis Date  . Hypertension   . Unspecified hypothyroidism   . Overweight   . Esophageal reflux   . Irritable bowel syndrome   . Unspecified hemorrhoids without mention of complication   . Fatty liver     Dr Kinnie Scales   . Diverticulitis   . Arthritis   . History of kidney stones   . Biliary dyskinesia   . Anxiety state, unspecified yrs ago     Current Outpatient Prescriptions  Medication Sig Dispense Refill  . aspirin 81 MG tablet Take 81 mg by mouth daily with breakfast.       . azithromycin (ZITHROMAX) 250 MG tablet Take as directed  6 each  0  . Cyanocobalamin (VITAMIN B 12 PO) Take 1 tablet by mouth daily.      [name] dicyclomine (BENTYL) 20 MG tablet Take 20 mg by mouth daily as needed. For stomach      .  hydrocortisone (ANUSOL-HC) 2.5 % rectal cream Place rectally 2 (two) times daily.  30 g  prn  . levothyroxine (SYNTHROID, LEVOTHROID) 200 MCG tablet Take 200 mcg by mouth daily.      [name] losartan-hydrochlorothiazide (HYZAAR) 100-25 MG per tablet Take 1/2 tablet by mouth daily      . Multiple Vitamin (MULTIVITAMIN) capsule Take 1 capsule by mouth daily.       . potassium chloride SA (K-DUR,KLOR-CON) 20 MEQ tablet Take 20 mEq by mouth daily.      [name] sulfamethoxazole-trimethoprim (SEPTRA DS) 800-160 MG per tablet Take 1 tablet by mouth 2 (two) times daily.  20 tablet  0   No current facility-administered medications for this visit.    Allergies  Allergen Reactions  . Niacin     REACTION: pt states INTOL w/ flushing  . Other     Red dye=rash   . Penicillins     REACTION:  childhood - extreme rash  . Rosuvastatin     REACTION: pt states INTOL   REVIEW OF SYSTEMS:  Cardiac:  Has history of hypertension since about 2005.  No history of seeing a cardiologist. Endocrine:  No diabetes. On thyroid replacement x 10 years. Gastrointestinal:  Liver biopsy with steatosis.  Colonoscopy in 2012 by Dr. Arlyce Dice. Urologic:  History of kidney stones.  Last attack 5 years ago. Musculoskeletal:  Right knee 2010 by Drs Murphy/Wainer. Psych:  Dr. Milas Hock - 09/11/2012  SOCIAL and FAMILY HISTORY: Married.  2 children, ages 100 and 7. 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  PHYSICAL EXAM: BP 127/77  Pulse 64  Temp(Src) 97.4 F (36.3 C) (Temporal)  Ht 5\' 10"  (1.778 m)  Wt 189 lb 6.4 oz (85.911 kg)  BMI 27.18 kg/m2  General: WN WM who is alert and generally healthy appearing. Has a white goatee. Abdomen: Soft. No mass.  No hernia. Normal bowel sounds.  Well healed laparoscopic abdominal scars. Rectum:  Digital exam negative.  He has excoriation around the rectum with minimal external hemorrhoids.  This looks more like pruritis ani to me.  There is no blood on exam, but he does a have a 0.8 mm healing ulcer anterior to the rectum.  DATA REVIEWED: None new.  See 01/10/2013 note for photos of his liver May 2013 and in Jan 2014.  Ovidio Kin, MD,  [facility] [address]  [address]   [place], [place]    [address] Phone:  [phone] FAX:  [phone]

## 2013-03-08 ENCOUNTER — Telehealth: Payer: Self-pay | Admitting: Pulmonary Disease

## 2013-03-08 MED ORDER — SULFAMETHOXAZOLE-TRIMETHOPRIM 800-160 MG PO TABS: 1.0000 | ORAL_TABLET | Freq: Two times a day (BID) | ORAL | Status: DC

## 2013-03-08 NOTE — Telephone Encounter (Signed)
Pt is aware of SN recs. He voiced his understanding and needed nothing further

## 2013-03-08 NOTE — Telephone Encounter (Signed)
I spoke with pt. He states he has another cyst in his groin area again. He saw SN on 01/21/13 for boil on scrotum. Pt stated he felt the knot last night and feels irritated. He is wanting to know if SN can call something in for this or if needs to be seen. Please advise thanks  Allergies  Allergen Reactions  . Niacin     REACTION: pt states INTOL w/ flushing  . Other     Red dye=rash   . Penicillins     REACTION: childhood - extreme rash  . Rosuvastatin     REACTION: pt states INTOL

## 2013-03-08 NOTE — Telephone Encounter (Signed)
Per SN---  Use hot soaks  Neosporin Call in septra ds  #20  1 po bid  thanks

## 2013-05-20 ENCOUNTER — Telehealth: Payer: Self-pay | Admitting: Pulmonary Disease

## 2013-05-20 MED ORDER — SULFAMETHOXAZOLE-TRIMETHOPRIM 800-160 MG PO TABS: 1.0000 | ORAL_TABLET | Freq: Two times a day (BID) | ORAL | Status: DC

## 2013-05-20 MED ORDER — METHYLPREDNISOLONE 4 MG PO KIT: PACK | ORAL | Status: DC

## 2013-05-20 MED ORDER — POTASSIUM CHLORIDE CRYS ER 20 MEQ PO TBCR: 20.0000 meq | EXTENDED_RELEASE_TABLET | Freq: Every day | ORAL | Status: DC

## 2013-05-20 NOTE — Telephone Encounter (Signed)
I spoke with pt. He stated he is having yellow places on nose. He is having green-yellow d/c from nose. His nose feels itchy and irritated. He stated it is not coming from his sinus's it is something in his nose. He is usbing OTC  antibacterial cream and itch cream w/o relief. Also he stated around his mouth is beginning to look irritated as well. TP is not in this week and pt is requetsing recs. Please advise SN thanks Last OV 01/30/13 No pending appt Allergies  Allergen Reactions  . Niacin     REACTION: pt states INTOL w/ flushing  . Other     Red dye=rash   . Penicillins     REACTION: childhood - extreme rash  . Rosuvastatin     REACTION: pt states INTOL

## 2013-05-20 NOTE — Telephone Encounter (Signed)
Error.Paul Shannon ° °

## 2013-05-20 NOTE — Telephone Encounter (Signed)
Called spoke with patient and advised of SN's recs as stated below Rx's sent to verified pharmacy Pt would like SN to know that the drainage is at "the tip of his nose" and when he "pushes his nose, there's some drainage right at the edge" Also had outpatient procedure this past Friday for a pinched nerve in his knee > was given IV abx prophylacticly   Pt to call back for further recs or ov if symptoms do not improve or worsen ==================== Refill on potassium sent to pharmacy

## 2013-05-20 NOTE — Telephone Encounter (Signed)
Per SN---  Septra ds  #14  1 po bid Medrol dosepak  #1  Take as directed mucinex Nasal saline spray

## 2013-05-20 NOTE — Telephone Encounter (Signed)
Pt wants refill on his potassium chloride.//[facility].Raylene Everts

## 2013-06-05 ENCOUNTER — Ambulatory Visit (INDEPENDENT_AMBULATORY_CARE_PROVIDER_SITE_OTHER): Payer: PRIVATE HEALTH INSURANCE | Admitting: Surgery

## 2013-06-05 VITALS — BP 130/78 | HR 71 | Temp 97.8°F | Resp 18 | Ht 70.0 in | Wt 185.0 lb

## 2013-06-05 DIAGNOSIS — Z9884 Bariatric surgery status: Secondary | ICD-10-CM

## 2013-06-05 NOTE — Progress Notes (Signed)
Re:   Paul Shannon DOB:   11-25-1966 MRN:   [id number]  ASSESSMENT AND PLAN: 1.  Morbid obesity - weight 228, BMI - 32.7    Lap Band, APS - 12/25/2012  Doing very well with weight loss.  No added fluid to lap band.  Will see back in 3 months.  2.  Esophageal nodule - benign squamous papilloma. 3.  History of diverticulitis. 4.  Hypertension 5.  Seasonal allergies 6.  History of kidney stones. 7.  Questionable right inguinal hernia.  Does not really bother him. 8.  On thyroid replacement.  For about 10 years.  TSH - 0.19 - 06/12/2012. 9.  Liver with 70% steatosis changes. Biopsy done during his lap chole on 05/11/2012.  He has a NASH score of 8.   Significant improvement of liver disease on most recent laparoscopy/liver biopsy - 12/25/2012  [photos attached at end of progress note] 10.  Biliary dyskinesia - S/P lap chole - 05/11/2012 - D. Crist Kruszka 11. Pruritis ani  Somewhat better.  He think that it may be food related.  He's added some pasta and backed off the protein shakes in his diet and this has helped. 12.  Rash - scalp, nares, ear  Responded to antibiotics.  Has seen Dr. Dub Amis (Derm) for this, but cause unclear. 13.  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 Dub Amis about 05/22/2013  He is doing very well from this.  Chief Complaint  Patient presents with  . Lap Band Fill   REFERRING PHYSICIAN:  Dr. Darrel Hoover  HISTORY OF PRESENT ILLNESS: Paul Shannon is a 47 y.o. (DOB: [birth year])  white male whose primary care physician is [name],[name], MD and comes to me follow for follow up of lap band placement.  The patient is doing well. He has some resistance was Lap band. He had one episode of vomiting.  He is maintaining his weight around 180. We spent more time talking about his allergies (he's seen Dr. Donzetta Starch), but can not figure out what is going on there. And he developed a right foot drop.  Had peroneal nerve surgery by Dr. Dub Amis about 05/22/2013.  He  has done much better that expected with this. We agreed he did not need a fill.  He liberalized his diet some, because it has helped with his pruritis ani.  Weight loss/Liver history: He understands the various options of weight loss surgery.  He has tried to diet, mainly limiting sugar intake and carbohydrate intake, and has had some modest success.  His father died in his 26's from cirrhosis, so this is driving him to consider weight loss surgery.  Much of the impetus for his interest in bariatric surgery is the relation of his weight to his liver disease.  He realizes the seriousness of progressive liver disease.  He said he has letters of support for bariatric surgery from Drs. Kriste Basque and Arlyce Dice.   Past Medical History  Diagnosis Date  . Hypertension   . Unspecified hypothyroidism   . Overweight   . Esophageal reflux   . Irritable bowel syndrome   . Unspecified hemorrhoids without mention of complication   . Fatty liver     Dr Kinnie Scales   . Diverticulitis   . Arthritis   . History of kidney stones   . Biliary dyskinesia   . Anxiety state, unspecified yrs ago     Current Outpatient Prescriptions  Medication Sig Dispense Refill  . aspirin  81 MG tablet Take 81 mg by mouth daily with breakfast.       . Cyanocobalamin (VITAMIN B 12 PO) Take 1 tablet by mouth daily.      [name] dicyclomine (BENTYL) 20 MG tablet Take 20 mg by mouth daily as needed. For stomach      . hydrocortisone (ANUSOL-HC) 2.5 % rectal cream Place rectally 2 (two) times daily.  30 g  prn  . levothyroxine (SYNTHROID, LEVOTHROID) 200 MCG tablet Take 200 mcg by mouth daily.      [name] losartan-hydrochlorothiazide (HYZAAR) 100-25 MG per tablet Take 1/2 tablet by mouth daily      . Multiple Vitamin (MULTIVITAMIN) capsule Take 1 capsule by mouth daily.       . potassium chloride SA (K-DUR,KLOR-CON) 20 MEQ tablet Take 1 tablet (20 mEq total) by mouth daily.  30 tablet  11  . azithromycin (ZITHROMAX) 250 MG tablet Take as directed  6  each  0  . methylPREDNISolone (MEDROL DOSEPAK) 4 MG tablet follow package directions  21 tablet  0  . sulfamethoxazole-trimethoprim (BACTRIM DS) 800-160 MG per tablet Take 1 tablet by mouth 2 (two) times daily.  14 tablet  0   No current facility-administered medications for this visit.    Allergies  Allergen Reactions  . Niacin     REACTION: pt states INTOL w/ flushing  . Other     Red dye=rash   . Penicillins     REACTION: childhood - extreme rash  . Rosuvastatin     REACTION: pt states INTOL   REVIEW OF SYSTEMS:  Cardiac:  Has history of hypertension since about 2005.  No history of seeing a cardiologist. Endocrine:  No diabetes. On thyroid replacement x 10 years. Gastrointestinal:  Liver biopsy with steatosis.  Colonoscopy in 2012 by Dr. Arlyce Dice. Urologic:  History of kidney stones.  Last attack 5 years ago. Musculoskeletal:  Right knee 2010 by Drs Murphy/Wainer. Psych:  Dr. Milas Hock - 09/11/2012  SOCIAL and FAMILY HISTORY: Married.  2 children, ages 24 and 73. 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  PHYSICAL EXAM: BP 130/78  Pulse 71  Temp(Src) 97.8 F (36.6 C)  Resp 18  Ht 5\' 10"  (1.778 m)  Wt 185 lb (83.915 kg)  BMI 26.54 kg/m2  General: WN WM who is alert and generally healthy appearing. Has a white goatee. Heart:  RRR Lungs: Clear and symmetric. Abdomen: Soft. No mass.  No hernia. Normal bowel sounds.  Well healed laparoscopic abdominal scars.  DATA REVIEWED: Notes in Epic.  See 01/10/2013 note for photos of his liver May 2013 and in Jan 2014.  Ovidio Kin, MD,  [facility] [address] [place]  [address]   [place], [place]    [address] Phone:  [phone] FAX:  [phone]

## 2013-06-21 ENCOUNTER — Emergency Department (INDEPENDENT_AMBULATORY_CARE_PROVIDER_SITE_OTHER)
Admission: EM | Admit: 2013-06-21 | Discharge: 2013-06-21 | Disposition: A | Discharge status: Home / Self Care | Payer: PRIVATE HEALTH INSURANCE | Source: Home / Self Care

## 2013-06-21 ENCOUNTER — Encounter (HOSPITAL_COMMUNITY): Payer: Self-pay | Admitting: Emergency Medicine

## 2013-06-21 DIAGNOSIS — IMO0002 Reserved for concepts with insufficient information to code with codable children: Secondary | ICD-10-CM

## 2013-06-21 DIAGNOSIS — L03011 Cellulitis of right finger: Secondary | ICD-10-CM

## 2013-06-21 MED ORDER — DOXYCYCLINE HYCLATE 100 MG PO CAPS: 100.0000 mg | ORAL_CAPSULE | Freq: Two times a day (BID) | ORAL | Status: DC

## 2013-06-21 NOTE — ED Provider Notes (Signed)
History    CSN: [id number] Arrival date & time 06/21/13  4098  None    Chief Complaint  Patient presents with  . Nail Problem   (Consider location/radiation/quality/duration/timing/severity/associated sxs/prior Treatment) HPI  47 yo come in with painful, red and swollen right distal small finger.  This started yesterday.  He was able to express some clear/white fluid from under the nailbed this morning.  Denies injury, fever, chills.  Has had some soreness in his right forearm.   Past Medical History  Diagnosis Date  . Hypertension   . Unspecified hypothyroidism   . Overweight(278.02)   . Esophageal reflux   . Irritable bowel syndrome   . Unspecified hemorrhoids without mention of complication   . Fatty liver     Dr Kinnie Scales   . Diverticulitis   . Arthritis   . History of kidney stones   . Biliary dyskinesia   . Anxiety state, unspecified yrs ago   Past Surgical History  Procedure Laterality Date  . Knee surgery  2011    right knee   . Liver biopsy    . Colonoscopy    . Breath tek h pylori  10/02/2012    Procedure: BREATH TEK H PYLORI;  Surgeon: Kandis Cocking, MD;  Location: [facility];  Service: General;  Laterality: N/A;  . Tonsillectomy and adenoidectomy  as child  . Cholecystectomy  05/11/2012  . Laparoscopic gastric banding  12/25/2012    Procedure: LAPAROSCOPIC GASTRIC BANDING;  Surgeon: Kandis Cocking, MD;  Location: [facility];  Service: General;  Laterality: N/A;  Lap Band Placement  . Mesh applied to lap port  12/25/2012    Procedure: MESH APPLIED TO LAP PORT;  Surgeon: Kandis Cocking, MD;  Location: [facility];  Service: General;;  . Liver biopsy  12/25/2012    Procedure: LIVER BIOPSY;  Surgeon: Kandis Cocking, MD;  Location: [facility];  Service: General;;   Family History  Problem Relation Age of Onset  . Heart disease Maternal Grandfather   . Diabetes Maternal Grandfather     and Father   . Kidney cancer Father   . Cancer Father     kidney  . Colon cancer Neg  Hx   . Esophageal cancer Neg Hx   . Stomach cancer Neg Hx   . Rectal cancer Neg Hx    History  Substance Use Topics  . Smoking status: Never Smoker   . Smokeless tobacco: Never Used  . Alcohol Use: No    Review of Systems  Constitutional: Negative.   HENT: Negative.   Respiratory: Negative.   Cardiovascular: Negative.   Gastrointestinal: Negative.   Genitourinary: Negative.   Neurological: Negative.   Psychiatric/Behavioral: Negative.     Allergies  Niacin; Other; Penicillins; and Rosuvastatin  Home Medications   Current Outpatient Rx  Name  Route  Sig  Dispense  Refill  . levothyroxine (SYNTHROID, LEVOTHROID) 200 MCG tablet   Oral   Take 200 mcg by mouth daily.         . Multiple Vitamin (MULTIVITAMIN) capsule   Oral   Take 1 capsule by mouth daily.          . potassium chloride SA (K-DUR,KLOR-CON) 20 MEQ tablet   Oral   Take 1 tablet (20 mEq total) by mouth daily.   30 tablet   11   . aspirin 81 MG tablet   Oral   Take 81 mg by mouth daily with breakfast.          .  Cyanocobalamin (VITAMIN B 12 PO)   Oral   Take 1 tablet by mouth daily.         [name] doxycycline (VIBRAMYCIN) 100 MG capsule   Oral   Take 1 capsule (100 mg total) by mouth every 12 (twelve) hours.   20 capsule   0    BP 136/88  Pulse 62  Temp(Src) 98 F (36.7 C) (Oral)  Resp 18  SpO2 98% Physical Exam  Constitutional: He is oriented to person, place, and time. He appears well-developed and well-nourished.  Eyes: EOM are normal. Pupils are equal, round, and reactive to light.  Neck: Normal range of motion.  Pulmonary/Chest: Effort normal.  Musculoskeletal:  Right small finger he has a red, swollen and mod tender paronychia.  Nail intact.  No drainage.    Neurological: He is alert and oriented to person, place, and time.  Skin: Skin is warm and dry.  Psychiatric: He has a normal mood and affect.    ED Course  INCISION AND DRAINAGE Date/Time: 06/21/2013 10:44 AM Performed  by: Zonia Kief Authorized by: Zonia Kief Consent: Verbal consent obtained. Risks and benefits: risks, benefits and alternatives were discussed Consent given by: patient Patient understanding: patient states understanding of the procedure being performed Patient consent: the patient's understanding of the procedure matches consent given Procedure consent: procedure consent matches procedure scheduled Site marked: the operative site was marked Imaging studies: imaging studies not available Required items: required blood products, implants, devices, and special equipment available Patient identity confirmed: verbally with patient Indications for incision and drainage: paronychia. Body area: upper extremity Location details: right small finger Anesthesia: local infiltration Local anesthetic: lidocaine 2% without epinephrine Anesthetic total: 1 ml Patient sedated: no Scalpel size: 11 Incision type: single straight (cuticle elevation off nailbed) Drainage: bloody Wound treatment: wound left open Packing material: none Patient tolerance: Patient tolerated the procedure well with no immediate complications.   (including critical care time) Labs Reviewed - No data to display No results found. 1. Paronychia, right     MDM  Patient will remove dressing tomorrow.  If still very painful,red, swollen he will return tomorrow for recheck.  Start doxycycline today.  All questions answered.    Meds ordered this encounter  Medications  . doxycycline (VIBRAMYCIN) 100 MG capsule    Sig: Take 1 capsule (100 mg total) by mouth every 12 (twelve) hours.    Dispense:  20 capsule    Refill:  0    Zonia Kief, PA-C 06/21/13 1047

## 2013-06-21 NOTE — ED Notes (Signed)
Pt c/o infected pinky nail on right hand since yesterday. Finger began to swell and he pushed all of the pus out through nailbed. Now finger is more painful and pain radiates down his arm. Slight numbness in tip of finger. Pt is alert and oriented.

## 2013-06-23 NOTE — ED Provider Notes (Signed)
Medical screening examination/treatment/procedure(s) were performed by resident physician or non-physician practitioner and as supervising physician I was immediately available for consultation/collaboration.   Barkley Bruns MD.   Linna Hoff, MD 06/23/13 [phone]

## 2013-07-26 ENCOUNTER — Other Ambulatory Visit: Payer: Self-pay | Admitting: Gastroenterology

## 2013-07-26 DIAGNOSIS — K589 Irritable bowel syndrome without diarrhea: Secondary | ICD-10-CM

## 2013-07-31 ENCOUNTER — Ambulatory Visit (INDEPENDENT_AMBULATORY_CARE_PROVIDER_SITE_OTHER): Payer: PRIVATE HEALTH INSURANCE | Admitting: Gastroenterology

## 2013-07-31 DIAGNOSIS — Z23 Encounter for immunization: Secondary | ICD-10-CM

## 2013-07-31 DIAGNOSIS — K589 Irritable bowel syndrome without diarrhea: Secondary | ICD-10-CM

## 2013-08-08 ENCOUNTER — Telehealth: Payer: Self-pay | Admitting: *Deleted

## 2013-08-08 NOTE — Telephone Encounter (Signed)
Message copied by Marlowe Kays on Thu Aug 08, 2013  3:22 PM ------      Message from: [name]      Created: Tue Jul 30, 2013 10:20 AM       FYI:  Paul Shannon pt was due for annual Twin rix in July 2014.  I checked and they have not been in for this. ------

## 2013-08-21 NOTE — Telephone Encounter (Signed)
Patient has received all 4 vaccines

## 2013-08-29 ENCOUNTER — Other Ambulatory Visit: Payer: Self-pay | Admitting: Pulmonary Disease

## 2013-09-05 ENCOUNTER — Encounter (INDEPENDENT_AMBULATORY_CARE_PROVIDER_SITE_OTHER): Payer: PRIVATE HEALTH INSURANCE | Admitting: Surgery

## 2013-10-17 ENCOUNTER — Other Ambulatory Visit: Payer: Self-pay

## 2013-10-24 ENCOUNTER — Encounter (INDEPENDENT_AMBULATORY_CARE_PROVIDER_SITE_OTHER): Payer: Self-pay | Admitting: Surgery

## 2013-10-24 ENCOUNTER — Ambulatory Visit (INDEPENDENT_AMBULATORY_CARE_PROVIDER_SITE_OTHER): Payer: PRIVATE HEALTH INSURANCE | Admitting: Surgery

## 2013-10-24 VITALS — BP 145/88 | HR 58 | Temp 98.4°F | Resp 14 | Ht 70.0 in | Wt 186.6 lb

## 2013-10-24 DIAGNOSIS — Z9884 Bariatric surgery status: Secondary | ICD-10-CM

## 2013-10-24 NOTE — Progress Notes (Signed)
Re:   Paul Shannon DOB:   [birth year] MRN:   [id number]  ASSESSMENT AND PLAN: 1.  Morbid obesity - weight 228, BMI - 32.7    Lap Band, APS - 12/25/2012  Doing very well with weight loss.  I have added no fluid to lap band.  The resistance has been good without a fill.  Will see back in 6 months.  If he is doing well at that time, I will see him on his anniversary.  2.  Esophageal nodule - benign squamous papilloma. 3.  History of diverticulitis. 4.  Hypertension 5.  Seasonal allergies 6.  History of kidney stones. 7.  Questionable right inguinal hernia.  Does not really bother him. 8.  On thyroid replacement.  For about 10 years.  TSH - 0.19 - 06/12/2012. 9.  Liver with 70% steatosis changes. Biopsy done during his lap chole on 05/11/2012.  He has a NASH score of 8.   Significant improvement of liver disease on most recent laparoscopy/liver biopsy - 12/25/2012   10.  Biliary dyskinesia - S/P lap chole - 05/11/2012 - D. Cassell Voorhies 11. Pruritis ani  Somewhat better.   12.  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 Dub Amis about 05/22/2013  He did very well from this.  Chief Complaint  Patient presents with  . Bariatric Follow Up   REFERRING PHYSICIAN:  Dr. Darrel Hoover  HISTORY OF PRESENT ILLNESS: Paul Shannon is a 47 y.o. (DOB: Feb 16, 1966)  white male whose primary care physician is [name],[name], MD and comes to me follow for follow up of lap band placement.  The patient is doing well. He has some resistance from the Lap band.  He eats 1 egg with cheese for breakfast.  He has to eat slowly.  So it sounds like it is functioning well. He has not exercised much because he is building a house in [place] (in [place]  But he is about finished and will increase what he does.  Weight loss/Liver history: He understands the various options of weight loss surgery.  He has tried to diet, mainly limiting sugar intake and carbohydrate intake, and has had some modest success.  His  father died in his 39's from cirrhosis, so this is driving him to consider weight loss surgery.  Much of the impetus for his interest in bariatric surgery is the relation of his weight to his liver disease.  He realizes the seriousness of progressive liver disease.  He said he has letters of support for bariatric surgery from Drs. Kriste Basque and Arlyce Dice.   Past Medical History  Diagnosis Date  . Hypertension   . Unspecified hypothyroidism   . Overweight(278.02)   . Esophageal reflux   . Irritable bowel syndrome   . Unspecified hemorrhoids without mention of complication   . Fatty liver     Dr Kinnie Scales   . Diverticulitis   . Arthritis   . History of kidney stones   . Biliary dyskinesia   . Anxiety state, unspecified yrs ago     Current Outpatient Prescriptions  Medication Sig Dispense Refill  . aspirin 81 MG tablet Take 81 mg by mouth daily with breakfast.       . Cyanocobalamin (VITAMIN B 12 PO) Take 1 tablet by mouth daily.      [name] doxycycline (VIBRAMYCIN) 100 MG capsule Take 1 capsule (100 mg total) by mouth every 12 (twelve) hours.  20 capsule  0  . levothyroxine (SYNTHROID, LEVOTHROID)  200 MCG tablet Take 200 mcg by mouth daily.      [name] levothyroxine (SYNTHROID, LEVOTHROID) 200 MCG tablet TAKE 1 TABLET BY MOUTH EVERY DAY  90 tablet  1  . Multiple Vitamin (MULTIVITAMIN) capsule Take 1 capsule by mouth daily.       . potassium chloride SA (K-DUR,KLOR-CON) 20 MEQ tablet Take 1 tablet (20 mEq total) by mouth daily.  30 tablet  11   No current facility-administered medications for this visit.    Allergies  Allergen Reactions  . Niacin     REACTION: pt states INTOL w/ flushing  . Other     Red dye=rash   . Penicillins     REACTION: childhood - extreme rash  . Rosuvastatin     REACTION: pt states INTOL   REVIEW OF SYSTEMS:  Cardiac:  Has history of hypertension since about 2005.  No history of seeing a cardiologist. Endocrine:  No diabetes. On thyroid replacement x 10  years. Gastrointestinal:  Liver biopsy with steatosis.  Colonoscopy in 2012 by Dr. Arlyce Dice. Urologic:  History of kidney stones.  Last attack 5 years ago. Musculoskeletal:  Right knee 2010 by Drs Murphy/Wainer. Psych:  Dr. Milas Hock - 09/11/2012  SOCIAL and FAMILY HISTORY: Married.  2 children, ages 36 and 65. 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  PHYSICAL EXAM: BP 145/88  Pulse 58  Temp(Src) 98.4 F (36.9 C) (Temporal)  Resp 14  Ht 5\' 10"  (1.778 m)  Wt 186 lb 9.6 oz (84.641 kg)  BMI 26.77 kg/m2  General: WN WM who is alert and generally healthy appearing. Has a white goatee. Heart:  RRR Lungs: Clear and symmetric. Abdomen: Soft. No mass.  No hernia. Normal bowel sounds.  Well healed laparoscopic abdominal scars.  DATA REVIEWED: Notes in Epic.  See 01/10/2013 note for photos of his liver May 2013 and in Jan 2014.  Ovidio Kin, MD,  [facility] [address] [place]  [address]   [place], [place]    [address] Phone:  [phone] FAX:  [phone]

## 2013-10-29 ENCOUNTER — Telehealth: Payer: Self-pay | Admitting: Pulmonary Disease

## 2013-10-29 MED ORDER — AZITHROMYCIN 250 MG PO TABS: ORAL_TABLET | ORAL | Status: DC

## 2013-10-29 NOTE — Telephone Encounter (Signed)
Per SN---  Ok to send in zpak #1  Take as directed.  Called and spoke with pt and he is aware of zpak sent in to the pharmacy

## 2013-10-29 NOTE — Telephone Encounter (Signed)
Last visit 01/30/13. Pt is c/o having sinus congestion. Pressure, PND, nasal congestion x 1 day. Pt states he feels like he is getting a sinus infection and is requesting an rx for a zpak. Please advise. Carron Curie, CMA Allergies  Allergen Reactions  . Niacin     REACTION: pt states INTOL w/ flushing  . Other     Red dye=rash   . Penicillins     REACTION: childhood - extreme rash  . Rosuvastatin     REACTION: pt states INTOL

## 2014-01-06 ENCOUNTER — Telehealth: Payer: Self-pay | Admitting: Pulmonary Disease

## 2014-01-06 MED ORDER — POTASSIUM CHLORIDE CRYS ER 20 MEQ PO TBCR: 20.0000 meq | EXTENDED_RELEASE_TABLET | Freq: Every day | ORAL | Status: DC

## 2014-01-06 MED ORDER — LEVOTHYROXINE SODIUM 200 MCG PO TABS: 200.0000 ug | ORAL_TABLET | Freq: Every day | ORAL | Status: DC

## 2014-01-06 NOTE — Telephone Encounter (Signed)
Pt is aware that his rx's have been sent in.

## 2014-01-10 IMAGING — CR DG CHEST 2V
2 series · 2 of 2 positions shown · non-contrast
Comparison: 03/19/2010

CLINICAL DATA: Preoperative respiratory exam for cholecystectomy.

CHEST - 2 VIEW

[w chest pa]
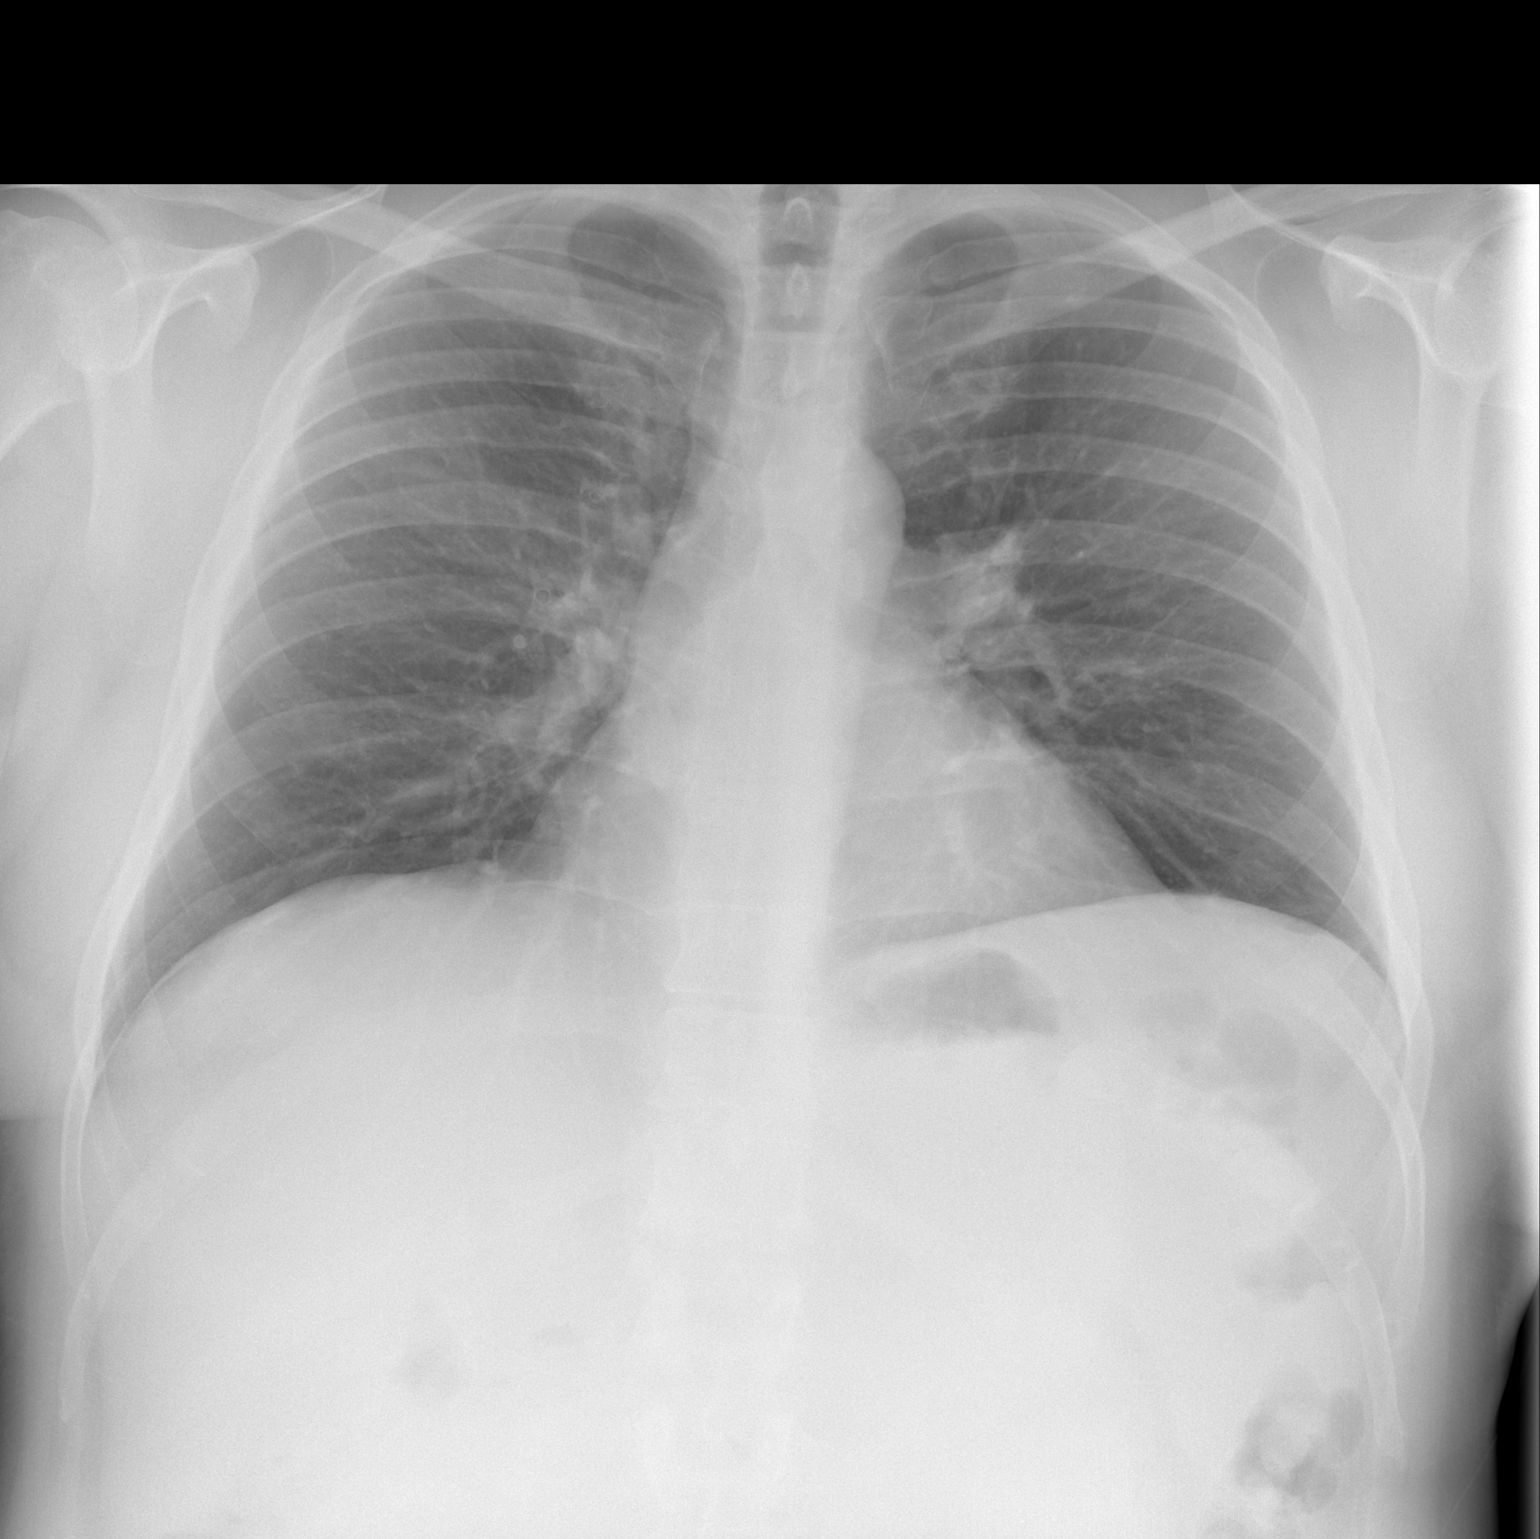

[w chest lat]
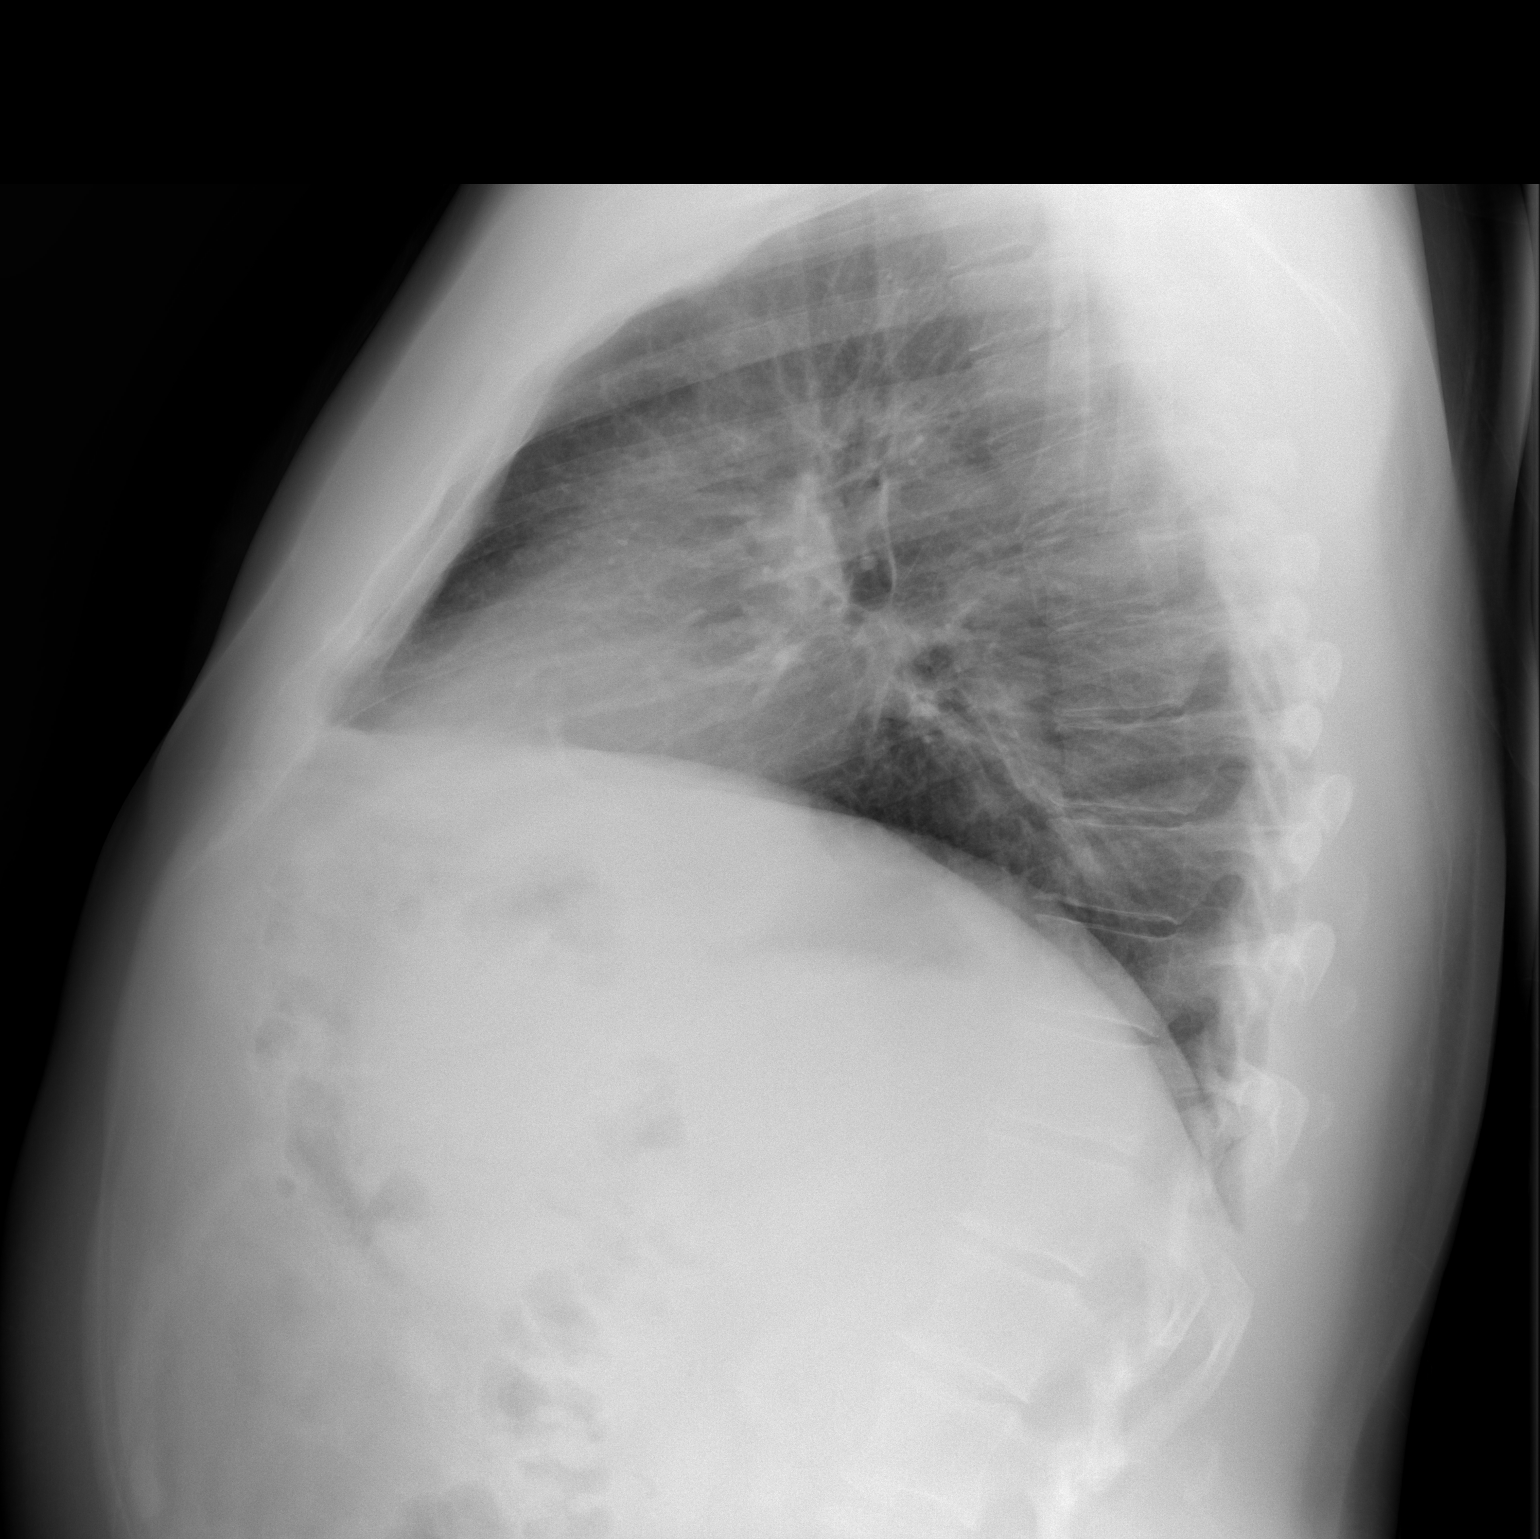

[2 of 2 positions shown; findings below may reference images not displayed]

FINDINGS: Heart size is normal.  Mediastinal shadows are normal.
The lungs are clear.  No effusions.  No bony abnormalities.
IMPRESSION: Normal chest

## 2014-01-22 ENCOUNTER — Telehealth: Payer: Self-pay | Admitting: Pulmonary Disease

## 2014-01-22 MED ORDER — OSELTAMIVIR PHOSPHATE 75 MG PO CAPS: 75.0000 mg | ORAL_CAPSULE | Freq: Two times a day (BID) | ORAL | Status: DC

## 2014-01-22 NOTE — Telephone Encounter (Signed)
Per SN----  mucinex 2 po bid Fluids Nasal saline mist every 1-2 hours HA----rx for vicodin  1 po tid prn headache  #50  And he already has the tamilfu---finish this.

## 2014-01-22 NOTE — Telephone Encounter (Signed)
Pt aware of recs. rx called in. Nothing further needed 

## 2014-01-22 NOTE — Telephone Encounter (Signed)
Spoke with pt. He c/o terrible HA, slight nasal congestion, very little dry cough, PND x this morning. No fever, no chills, no sweats, body aches. Pt was dx with flu on Monday-giving tamiflu. Please advise SN thanks  Allergies  Allergen Reactions  . Niacin     REACTION: pt states INTOL w/ flushing  . Other     Red dye=rash   . Penicillins     REACTION: childhood - extreme rash  . Rosuvastatin     REACTION: pt states INTOL

## 2014-01-22 NOTE — Telephone Encounter (Signed)
Pt was not giving rx for tamiflu. His son was. The son started out with a sever headache like he was having and then went to the doctor and was told he had the flu. Then later that night fever started. Please advise thanks

## 2014-01-22 NOTE — Telephone Encounter (Signed)
Per SN---  tamiflu 75 mg  #10  1 po bid

## 2014-02-10 ENCOUNTER — Other Ambulatory Visit (INDEPENDENT_AMBULATORY_CARE_PROVIDER_SITE_OTHER): Payer: PRIVATE HEALTH INSURANCE

## 2014-02-10 ENCOUNTER — Other Ambulatory Visit: Payer: Self-pay | Admitting: Pulmonary Disease

## 2014-02-10 DIAGNOSIS — E039 Hypothyroidism, unspecified: Secondary | ICD-10-CM

## 2014-02-10 DIAGNOSIS — Z0000 Encounter for general adult medical examination without abnormal findings: Secondary | ICD-10-CM

## 2014-02-10 DIAGNOSIS — I10 Essential (primary) hypertension: Secondary | ICD-10-CM

## 2014-02-10 DIAGNOSIS — E7800 Pure hypercholesterolemia, unspecified: Secondary | ICD-10-CM

## 2014-02-10 DIAGNOSIS — K7689 Other specified diseases of liver: Secondary | ICD-10-CM

## 2014-02-10 LAB — CBC WITH DIFFERENTIAL/PLATELET
Basophils Absolute: 0 10*3/uL (ref 0.0–0.1)
Basophils Relative: 0.4 % (ref 0.0–3.0)
EOS ABS: 0.2 10*3/uL (ref 0.0–0.7)
Eosinophils Relative: 4 % (ref 0.0–5.0)
HEMATOCRIT: 46.2 % (ref 39.0–52.0)
Hemoglobin: 15.4 g/dL (ref 13.0–17.0)
LYMPHS ABS: 2.2 10*3/uL (ref 0.7–4.0)
Lymphocytes Relative: 35.7 % (ref 12.0–46.0)
MCHC: 33.4 g/dL (ref 30.0–36.0)
MCV: 96.9 fl (ref 78.0–100.0)
MONO ABS: 0.7 10*3/uL (ref 0.1–1.0)
Monocytes Relative: 10.9 % (ref 3.0–12.0)
NEUTROS PCT: 49 % (ref 43.0–77.0)
Neutro Abs: 3 10*3/uL (ref 1.4–7.7)
PLATELETS: 243 10*3/uL (ref 150.0–400.0)
RBC: 4.77 Mil/uL (ref 4.22–5.81)
RDW: 12.7 % (ref 11.5–14.6)
WBC: 6.1 10*3/uL (ref 4.5–10.5)

## 2014-02-10 LAB — URINALYSIS
BILIRUBIN URINE: NEGATIVE
Ketones, ur: NEGATIVE
LEUKOCYTES UA: NEGATIVE
Nitrite: NEGATIVE
Specific Gravity, Urine: >=1.03 — AB (ref 1.000–1.030)
Total Protein, Urine: NEGATIVE
Urine Glucose: NEGATIVE
Urobilinogen, UA: 0.2 (ref 0.0–1.0)
pH: 5.5 (ref 5.0–8.0)

## 2014-02-10 LAB — HEPATIC FUNCTION PANEL
ALBUMIN: 4.4 g/dL (ref 3.5–5.2)
ALK PHOS: 62 U/L (ref 39–117)
ALT: 26 U/L (ref 0–53)
AST: 23 U/L (ref 0–37)
BILIRUBIN DIRECT: 0.2 mg/dL (ref 0.0–0.3)
BILIRUBIN TOTAL: 1.6 mg/dL — AB (ref 0.3–1.2)
Total Protein: 7.8 g/dL (ref 6.0–8.3)

## 2014-02-10 LAB — LIPID PANEL
CHOL/HDL RATIO: 3
Cholesterol: 101 mg/dL (ref 0–200)
HDL: 36.6 mg/dL — ABNORMAL LOW (ref >39.00)
LDL Cholesterol: 54 mg/dL (ref 0–99)
Triglycerides: 52 mg/dL (ref 0.0–149.0)
VLDL: 10.4 mg/dL (ref 0.0–40.0)

## 2014-02-10 LAB — BASIC METABOLIC PANEL
BUN: 14 mg/dL (ref 6–23)
CALCIUM: 9.4 mg/dL (ref 8.4–10.5)
CO2: 29 mEq/L (ref 19–32)
Chloride: 103 mEq/L (ref 96–112)
Creatinine, Ser: 0.8 mg/dL (ref 0.4–1.5)
GFR: 118.62 mL/min (ref >60.00)
GLUCOSE: 83 mg/dL (ref 70–99)
POTASSIUM: 3.6 meq/L (ref 3.5–5.1)
SODIUM: 141 meq/L (ref 135–145)

## 2014-02-10 LAB — TSH: TSH: 0.16 u[IU]/mL — AB (ref 0.35–5.50)

## 2014-02-11 ENCOUNTER — Other Ambulatory Visit: Payer: Self-pay | Admitting: Pulmonary Disease

## 2014-02-12 ENCOUNTER — Ambulatory Visit (INDEPENDENT_AMBULATORY_CARE_PROVIDER_SITE_OTHER): Payer: PRIVATE HEALTH INSURANCE | Admitting: Pulmonary Disease

## 2014-02-12 ENCOUNTER — Encounter: Payer: Self-pay | Admitting: Pulmonary Disease

## 2014-02-12 VITALS — BP 136/88 | HR 67 | Temp 98.9°F | Ht 70.0 in | Wt 193.6 lb

## 2014-02-12 DIAGNOSIS — K7689 Other specified diseases of liver: Secondary | ICD-10-CM

## 2014-02-12 DIAGNOSIS — Z9884 Bariatric surgery status: Secondary | ICD-10-CM

## 2014-02-12 DIAGNOSIS — M1990 Unspecified osteoarthritis, unspecified site: Secondary | ICD-10-CM

## 2014-02-12 DIAGNOSIS — K219 Gastro-esophageal reflux disease without esophagitis: Secondary | ICD-10-CM

## 2014-02-12 DIAGNOSIS — E039 Hypothyroidism, unspecified: Secondary | ICD-10-CM

## 2014-02-12 DIAGNOSIS — Z0000 Encounter for general adult medical examination without abnormal findings: Secondary | ICD-10-CM | POA: Insufficient documentation

## 2014-02-12 DIAGNOSIS — K5730 Diverticulosis of large intestine without perforation or abscess without bleeding: Secondary | ICD-10-CM

## 2014-02-12 MED ORDER — POTASSIUM CHLORIDE CRYS ER 20 MEQ PO TBCR: EXTENDED_RELEASE_TABLET | ORAL | Status: DC

## 2014-02-12 MED ORDER — HYDROCORTISONE 2.5 % RE CREA: 1.0000 "application " | TOPICAL_CREAM | Freq: Two times a day (BID) | RECTAL | Status: DC

## 2014-02-12 MED ORDER — LEVOTHYROXINE SODIUM 175 MCG PO TABS: 175.0000 ug | ORAL_TABLET | Freq: Every day | ORAL | Status: DC

## 2014-02-12 NOTE — Patient Instructions (Signed)
Today we updated your med list in our EPIC system...    Based on your most recent TSH level- we decided to DECREASE your synthroid from 29mcg/d to 155mcg/d and recheck the TSH in about 3 months...  Keep up the good work w/ diet & exercise...  Call for any questions or if we can be of service in any way.Marland KitchenMarland Kitchen

## 2014-02-12 NOTE — Progress Notes (Signed)
Subjective:   HPI  Review of Systems  Physical Exam  Subjective:    Patient ID: Paul Shannon, male    DOB: 1966-02-03, 48 y.o.   MRN: [id number]  HPI 48 y/o WM here for a follow up visit & CPX... he has multiple medical problems as noted below...   ~  June 01, 2011:  13moROV & post hosp visit> He was hospitalized 5/12 w/ acute diverticulitis, treated w/ Cipro & Flagyl & slowly improved; CT Abd revealed acute diverticulitis in prox sigmoid w/ perforation & pericolonic inflamm, and fatty liver disease; CCS consult by Paul Shannon w/ rec for med rx & no surg intervention; he has a post hosp follow up appt w/ Paul Shannon soon...     Medically Paul Shannon been OK> BP controlled on Losartan; note- CXR & EKG were not done during his recent hosp...    We did f/u Fasting labs and FLP looks good on diet alone now x for low HDL at 30 & advised incr exercise, get wt down; despite the hosp he hasn't lost much wt.    Clinically euthyroid on large dose of Synthroid & TSH= 1.09    He has some DJD knees and he right knee arthroscopy 10/11 by Paul Shannon...  ~  July 2. 2013:  1 year ROV & CPX> FBleasedeveloped abd pain 3/13 & had eval Paul Shannon w/ an abn hepatobiliary scan showing an abn low ejection fraction of 2% (norm>30%); He had elev LFTs as well but this was felt to be more likely related to his known Hepatic Steatosis; [facility] to further investigate the status of his liver disease (recall hx of father passing away w/ cirrhosis due to this condition) as his last bx was in the 989's  He did well w/ surgery & had neg intraop cholangiogram, & an uneventful recovery;  Liver Bx however showed progressive HepSteatosis- 70% steatosis & total NASH score of 8 (out of poss 9);  He has a follow up appt w/ Paul Shannon to consider his options as he is one sm step away from cirrhosis at this juncture & Bariatric surg may be an option...    We reviewed prob list, meds,  xrays and labs> see below for updates>>  LABS 5/13 in hosp:  Elev LFTs as noted;  CBC- ok   LABS 7/13:  FLP- ok on diet alone;  Chems- ok x persist incr LFTs due to steatosis;  TSH= 0.19 on Synth200 & we decided to slowly wean...  ~  January 21, 2013:  755moOV & Paul Shannon s/p Lap Band surg for his Hepatic Steatosis & pos FamHx cirrhosis from this condition (his insurance wouldn't cover a gastric procedure); he has already lost over 30# down to 202# today & he says "I never would have known how bad I really felt til I could compare to how good I feel now" and he states he is now eating just 10% of what he ate before...    Presents w/ small knot on scrotum- ?local skin infection, not very severe, no regional lymphadenopathy, etc; we discussed Rx w/ sitz baths, Neosporin, course of empiric SeptraDSBid (allergy to PCN); to Urology if this is not resolving...    Requesting Rx for Hem's as well> we discussed sitz baths, Miralax/ Senakot, AnusolHC cream as needed... We reviewed prob list, meds, xrays and labs> see below for updates >>   ~  January 30, 2013:  Annual CPX> Paul Shannon's scrotal  lesion has resolved w/ Sitz, topical Rx & SeptraDS... No new complaints or concerns, we reviewed the following medical problems during today's office visit >>     HBP> on ASA81, Hyzaar100-25, K20Bid; BP= 108/70 assoc w/ his wt reduction of 36# total so far; he denies CP, palpit, SOB, edema; we decided to decr the Hyzaar to 1/2 tab daily & cut the K20 to one daily...    Lipids> on diet alone; wt is down as noted after LapBand surg; FLP shows TChol 135, TG 55, HDL 55, LDL 69    Hypothyroid> on Levothyroid 275mg daily; hx of suspect med compliance in past; clinically & biochem euthyroid but TSH 2/14 = 112, he states taking it every day, call to pharm indicates a lapse in Rx but filled #90 12/09/12, didn't bring bottles; recheck TSH= 66 & I called Paul Shannon (he agreed that compliance is suspect); Rec to take the 2080m pill every  day 1st thing in the AM on empty stomach & don't eat for 1h...    Overweight> he had Lap Band surg 12/26/12 by Paul Shannon much better by his history & he est that he is eating 10% of what he ate before; wt is down 36# from our peak to 199# today...    GI-GERD, Divertics, IBS, Hems, s/p GB> on AnusolHC cream as needed; not requiring PPI rx etc; he denies abd pain, n/v, c/d, blood seen...    Fatty Liver Dis> Paul Shannon mentioned that liver looked some better at the time of his surg; LFTs much improved & now WNL...[name]   DJD, right knee discomfort> hx right knee arthroscopy 10/11 by Paul Shannon; stable 7 he avoids NSAIDs...     Anxiety> prev on alpraz0.5 prn but he has been off all meds for some time- doing well w/o anxiety symptoms... We reviewed prob list, meds, xrays and labs> see below for updates >>   LABS 2/14:  FLP- at goals on diet alone;  Chems- wnl & LFTs now normal;  CBC- wnl;  TSH=112 & repeat=66 on Levothy20046md > asked to take it every AM, on empty stomach, don't eat for 1H, etc...  ~  February 12, 2014:  Yearly ROV & CPX... Paul Shannon's weight was down to 175# after his LapBand surg, but he gained up to 199# last yr & now at 194# and his LFTs remain wnl... He has a BroTeaching laboratory technicianiRonalee Shannon/ elevLFTs & steatosis; and another Paul Shannon...  We reviewed the following medical problems during today's office visit >>     HBP> on ASA81, & off all BP meds; BP= 136/88 & he denies CP, palpit, SOB, edema; we discussed that he might have to restart BP meds if wt increases...    Lipids> on diet alone; wt is down as noted after LapBand surg; FLP 3/15 shows TChol 101, TG 52, HDL 37, LDL 54    Hypothyroid> on Levothyroid 200m11maily; hx of suspect med compliance in past; clinically & biochem euthyroid but TSH 3/15 = 0.16, we decided to inch his dose down to 175mc74m& recheck 31mo, 29monded to take it daily!    Overweight> he had Lap Band surg 12/26/12 by Paul Shannon much better by his history & he est that he is  eating 10% of what he ate before; wt is down 42# from our peak to 194# today...    GI-GERD, Divertics, IBS, Hems, s/p GB> on AnusolHC cream as needed; not requiring PPI rx etc; he denies abd pain, n/v, c/d, blood seen...Marland KitchenMarland Kitchen  Fatty Liver Dis> Paul Shannon mentioned that liver looked some better at the time of his surg; LFTs much improved & now remain WNL.[name].    DJD, right knee discomfort> hx right knee arthroscopy 10/11 by Paul Shannon; stable & he avoids NSAIDs...     Anxiety> prev on Alpraz0.5 prn but he has been off all meds for some time- doing well w/o anxiety symptoms... We reviewed prob list, meds, xrays and labs> see below for updates >>   LABS 3/15:  FLP- at goals on diet alone;  Chems- wnl;  CBC- wnl;  TSH=0.16...          Problem List:  HYPERTENSION (ICD-401.9) - controlled on HYZAAR 100-25 daily... BP=116/76 today, & he states OK at home ~130's to 140/ 80's-90... denies HA, fatigue, visual changes, CP, palipit, dizziness, syncope, dyspnea, edema, etc...  ~  CXR 4/11 showed low lung vol's but clear, normal Cor, NAD. ~  EKG showed NSR rate 60, no signif STTWA's, WNL. ~  CXR 5/13 showed normal heart size, clear lungs, NAD... ~  2/14: on ASA81, Hyzaar100-25, K20Bid; BP= 108/70 assoc w/ his wt reduction of 36# total so far; he denies CP, palpit, SOB, edema; we decided to decr the Hyzaar to 1/2 tab daily & cut the K20 to one daily...  HYPERCHOLESTEROLEMIA (ICD-272.0) - he has refused meds preferring to attempt control on diet alone... he was intol to Niacin in past w/ flushing... intol to Crestor10 when tried in Sep09... ~  [place] 3/08 showed TChol 143, TG 190, HDL 30, LDL 75... rec to start fibrate- he declined meds. ~  [place] 9/09 showed TChol 143, TG 123, HDL 20, LDL 99... rec to start Crestor93m/d- he agrees. ~  pt states that he didn't tolerate the Crestor10 either, therefore stopped it. ~  FLP 4/11 showed TChol 130, TG 94, HDL 33, LDL 79... much improved, on diet alone. ~  FLP 6/12 on diet alone  (after divertic episode) showed TChol 131, TG 112, HDL 30, LDL 78... rec incr exercise. ~  FLP 7/13 on diet alone showed TChol 123, TG 123, HDL 34, LDL 65 ~  FLP 2/14 on diet alone w/ 36# wt reduction so far showed TChol 135, TG 55, HDL 55, LDL 69  HYPOTHYROIDISM (ICD-244.9) - on SYNTHROID 2096m/d w/ ?compliance issues...  ~  labs 3/08 showed TSH=20.5 on Synthroid 17520md and dose increased to 200. ~  labs 9/09 showed TSH= 6.65 on 200m28m... continue same med. ~  ran out of meds Feb10 and didn't refill- labs 3/10 showed TSH= 93... rec> restart LEVOTHY200. ~  labs 4/11 showed TSH= 0.61... clinically euthyroid, may need to cut back dose. ~  Labs 6/12 showed TSH= 1.09 ~  Labs 7/13 on Synthroid200 showed TSH= 0.19 & we decided to decr the Synthroid to 175mc31m.. ~  Labs 2/14 on Synthroid200 showed TSH=112, repeat=66; we discussed taking med every day, 1st thing in the AM on empty stomach, don't eat for 1h; I discussed this by phone w/ Paul Shannon & he concurred that med compliace was the most likely explanation...  OVERWEIGHT (ICD-278.02) - since we first met Kalin in 1997 his weight has been 220-230 range... occas down to 210-215, and once down to 205# in 2001... highest weight has been 232#... ~ Marland Kitchenweight 3/10 = 230# ~  weight 4/11 = 228# ~  Weight 6/12 = 221#... This was after his diverticulitis episode. ~  Weight 7/13 = 235# ~  Weight 2/14 = 202#... s/p Lap Band surg 1/14. ~  Weight  2/14 = 199#  GERD (ICD-530.81) - uses OTC Prilosec as needed. ~  EGD 3/13 by Paul Shannon showed a benign nodule in the distal esoph, otherw neg...  DIVERTICULITIS>  Adm to hosp 5/12 w/ acute sigmoid diverticulitis;  treated w/ Cipro & Flagyl & slowly improved; CT Abd revealed acute diverticulitis in prox sigmoid w/ perforation & pericolonic inflamm, and fatty liver disease; CCS consult by Paul Shannon w/ rec for med rx & no surg intervention; he has a post hosp follow up appt w/ Paul Shannon ==> pending 6/12 ~  Colonoscopy  6/12 by Paul Shannon showed mod divertics in the sigmoid, otherw neg & f/u planned 45yr...  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 he uses OTC PrepH.  FATTY LIVER DISEASE (ICD-571.8) -  he [facility] there is a pos family hx of cirrhosis in his father who died from this disease... he has been counselled on weight reduction and told to avoid hepatotoxins including alcohol...  he has discussed Hepatic Steatosis/ Fatty Liver Disease w/ GI, and we have provided him w/ mult hand-out about this condition...  he understands that it can lead to cirrhosis and death from liver failure... he understands that he must get his weight down, control his fat intake, and follow the FLP & LFT's carefully... ~  LFTs 3/08 showed SGOT= 61, SGPT= 100... rec> get wt down. ~  LFTs 9/09 showed SGOT= 43, SGPT= 62... improved. ~  LFTs 2/10 showed SGOT= 29, SGPT= 39... normal! ~  LFTs 4/11 showed SGOT= 60, SGPT= 97... must diet & get wt down! 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 ~  LFTs 6/13 showed SGOT= 121, SGPT= 164... Pt referred back to GI for his severe hepatic steatosis... ~  LFTs 1/14 s/p Lap Band surg for his steatohepatitis showed SGOT=47, SGPT=72...  ~  LFTs 2/14 (wt=199#) showed SGOT=38, SGPT=34; much improved...  Small lesion in the skin on the underside of the scrotum >> presented 2/14 w/ this superfic infection; treated w/ Sitz, Neosporin, SeptraDSBid... ~  2/14: pt reports much better & lesion resolved on Rx...  DJD/ Right Knee arthroscopy 10/11 by Paul Shannon for menicus tears & chondromalacia...  ANXIETY (ICD-300.00) - uses Alprazolam Prn...  DERM - mult tattoos...  Health Maintenance -  ~  GU:  age 48 PSA checked 2/10 due to symptoms  and normal at 0.85... ~  GI:  [facility]... GI f/u pending w/ Paul Shannon s/p diverticulitis. ~  Immunizations:  ? last Tetanus shot (he agrees to TDAP today)... hasn't had Pneumovax, Flu shots (refuses), or HepB series...  << NOTE: G[facility] >>   Past Surgical History  Procedure Laterality Date  . Knee surgery  2011    right knee   . Liver biopsy    . Colonoscopy    . Breath tek h pylori  10/02/2012    Procedure: BREATH TEK H PYLORI;  Surgeon: DShann Medal MD;  [facility];  Service: General;  Laterality: N/A;  . Tonsillectomy and adenoidectomy  as child  . Cholecystectomy  05/11/2012  . Laparoscopic gastric banding  12/25/2012    Procedure: LAPAROSCOPIC GASTRIC  BANDING;  Surgeon: Shann Medal, MD;  Location: [facility];  Service: General;  Laterality: N/A;  Lap Band Placement  . Mesh applied to lap port  12/25/2012    Procedure: MESH APPLIED TO LAP PORT;  Surgeon: Shann Medal, MD;  Location: [facility];  Service: General;;  . Liver biopsy  12/25/2012    Procedure: LIVER BIOPSY;  Surgeon: Shann Medal, MD;  Location: [facility];  Service: General;;    Outpatient Encounter Prescriptions as of 02/12/2014  Medication Sig  . aspirin 81 MG tablet Take 81 mg by mouth daily with breakfast.   . levothyroxine (SYNTHROID, LEVOTHROID) 200 MCG tablet Take 1 tablet (200 mcg total) by mouth daily before breakfast.  . Multiple Vitamin (MULTIVITAMIN) capsule Take 1 capsule by mouth daily.   . potassium chloride SA (K-DUR,KLOR-CON) 20 MEQ tablet TAKE 1 TABLET BY MOUTH EVERY DAY  . [DISCONTINUED] azithromycin (ZITHROMAX) 250 MG tablet Take as directed  . [DISCONTINUED] Cyanocobalamin (VITAMIN B 12 PO) Take 1 tablet by mouth daily.  . [DISCONTINUED] doxycycline (VIBRAMYCIN) 100 MG capsule Take 1 capsule (100 mg total) by mouth every 12 (twelve) hours.  . [DISCONTINUED] oseltamivir (TAMIFLU) 75 MG capsule  Take 1 capsule (75 mg total) by mouth 2 (two) times daily.    Allergies  Allergen Reactions  . Niacin     REACTION: pt states INTOL w/ flushing  . Other     Red dye=rash   . Penicillins     REACTION: childhood - extreme rash  . Rosuvastatin     REACTION: pt states INTOL    Current Medications, Allergies, Past Medical History, Past Surgical History, Family History, and Social History were reviewed in [employer] record.   Review of Systems        The patient complains of joint pain and paresthesias.  The patient denies fever, chills, sweats, anorexia, fatigue, weakness, malaise, weight loss, sleep disorder, blurring, diplopia, eye irritation, eye discharge, vision loss, eye pain, photophobia, earache, ear discharge, tinnitus, decreased hearing, nasal congestion, nosebleeds, sore throat, hoarseness, chest pain, palpitations, syncope, dyspnea on exertion, orthopnea, PND, peripheral edema, cough, dyspnea at rest, excessive sputum, hemoptysis, wheezing, pleurisy, nausea, vomiting, diarrhea, constipation, change in bowel habits, abdominal pain, melena, hematochezia, jaundice, gas/bloating, indigestion/heartburn, dysphagia, odynophagia, dysuria, hematuria, urinary frequency, urinary hesitancy, nocturia, incontinence, back pain, joint swelling, muscle cramps, muscle weakness, stiffness, arthritis, sciatica, restless legs, leg pain at night, leg pain with exertion, rash, itching, dryness, suspicious lesions, paralysis, seizures, tremors, vertigo, transient blindness, frequent falls, frequent headaches, difficulty walking, depression, anxiety, memory loss, confusion, cold intolerance, heat intolerance, polydipsia, polyphagia, polyuria, unusual weight change, abnormal bruising, bleeding, enlarged lymph nodes, urticaria, allergic rash, hay fever, and recurrent infections.     Objective:   Physical Exam      WD, Overweight, 48 y/o WM in NAD... GENERAL:  Alert & oriented;  pleasant & cooperative... HEENT:  [place]/AT, EOM-wnl, PERRLA, EACs-clear, TMs-wnl, NOSE-clear, THROAT-clear & wnl... NECK:  Supple w/ full ROM; no JVD; normal carotid impulses w/o bruits; no thyromegaly or nodules palpated; no lymphadenopathy... CHEST:  Clear to P & A; without wheezes/ rales/ or rhonchi heard... HEART:  Regular Rhythm; without murmurs/ rubs/ or gallops detected... ABDOMEN:  Soft & min tender LLQ; normal bowel sounds; no organomegaly or masses palpated... [place]: sm reddish knot on skin, sl thickened, no drainage, testes ok, penis ok, no regional LNs....  RECTAL:  neg- prostate 2+ normal, stool heme neg, few ext hem's noted... EXT: without deformities, mild  arthritic changes;   NEURO:  CN's intact; motor testing normal; sensory testing normal; gait normal & balance OK. DERM:  No lesions noted; no rash, several tatoos...   Assessment & Plan:    HBP>  Controlled on Hyzaar w/ his wt loss; we decided to decr the dose to 1/2 tab & one K20/d...  CHOL>  Improved w/  wt reduction, advised to keep up the wt reduction efforts & low fat diet...  HYPOTHYROID>  Stable on the Synthroid 268mg/d dose, clinically & biochem euthyroid but compliance is suspect; asked to take Levothy200 1st thing in the AM, don't eat for 1h, & bring all bottles to office for review...  OBESITY>  He [facility] already [facility] 36# & he feels much better...  GI>  GERD/ Divertics/ IBS/ Fatty Liver>  Followed by DDemetra Shinerfor GI...  DIVERTICULITIS>  AS ABOVE- acute diverticulitis 5/12 w/ perf & pericolonic inflamm, improved w/ antibiotic Rx & f/u colon 6/12 was otherw neg...  NAFLD>  This is his most serious medical condition w/ liver bx proving marked progression over the last 10+yrs; his father died from this disease; he had bariatric surg 1/14 7 has lost >30#, feeling better & LFTs improved... We will continue to follow...  DJD> s/p right knee arthroscopy by Paul Shannon  10/11...   Patient's Medications  New Prescriptions   HYDROCORTISONE (ANUSOL-HC) 2.5 % RECTAL CREAM    Place 1 application rectally 2 (two) times daily.   LEVOTHYROXINE (SYNTHROID, LEVOTHROID) 175 MCG TABLET    Take 1 tablet (175 mcg total) by mouth daily before breakfast.   ZOLPIDEM (AMBIEN) 10 MG TABLET    Take 1 tablet (10 mg total) by mouth at bedtime as needed for sleep.  Previous Medications   ASPIRIN 81 MG TABLET    Take 81 mg by mouth daily with breakfast.    MULTIPLE VITAMIN (MULTIVITAMIN) CAPSULE    Take 1 capsule by mouth daily.   Modified Medications   No medications on file  Discontinued Medications   AZITHROMYCIN (ZITHROMAX) 250 MG TABLET    Take as directed   CYANOCOBALAMIN (VITAMIN B 12 PO)    Take 1 tablet by mouth daily.   DOXYCYCLINE (VIBRAMYCIN) 100 MG CAPSULE    Take 1 capsule (100 mg total) by mouth every 12 (twelve) hours.   LEVOTHYROXINE (SYNTHROID, LEVOTHROID) 200 MCG TABLET    Take 1 tablet (200 mcg total) by mouth daily before breakfast.   OSELTAMIVIR (TAMIFLU) 75 MG CAPSULE    Take 1 capsule (75 mg total) by mouth 2 (two) times daily.   POTASSIUM CHLORIDE SA (K-DUR,KLOR-CON) 20 MEQ TABLET    TAKE 1 TABLET BY MOUTH EVERY DAY   POTASSIUM CHLORIDE SA (K-DUR,KLOR-CON) 20 MEQ TABLET    TAKE 1 TABLET BY MOUTH EVERY DAY

## 2014-06-18 ENCOUNTER — Ambulatory Visit (INDEPENDENT_AMBULATORY_CARE_PROVIDER_SITE_OTHER): Payer: PRIVATE HEALTH INSURANCE | Admitting: Surgery

## 2014-06-18 ENCOUNTER — Encounter (INDEPENDENT_AMBULATORY_CARE_PROVIDER_SITE_OTHER): Payer: Self-pay | Admitting: Surgery

## 2014-06-18 VITALS — BP 134/80 | HR 78 | Ht 69.0 in | Wt 194.0 lb

## 2014-06-18 DIAGNOSIS — Z9884 Bariatric surgery status: Secondary | ICD-10-CM

## 2014-06-18 NOTE — Progress Notes (Signed)
Re:   Paul Shannon DOB:   1966-09-08 MRN:   [id number]  ASSESSMENT AND PLAN: 1.  Morbid obesity - weight 228, BMI - 32.7    Lap Band, APS - 12/25/2012  He has done well with weight loss, but has gained a little weight back.  I have added no fluid to lap band.  The resistance has been good without a fill.  I will now see him back in one year.  Though he has no fluid in the lap band, it is providing resistance.  He needs to eat less ice cream.  2.  Esophageal nodule - benign squamous papilloma. 3.  History of diverticulitis. 4.  Hypertension 5.  Seasonal allergies 6.  History of kidney stones. 7.  Questionable right inguinal hernia.  Does not really bother him. 8.  On thyroid replacement.  For about 10 years.  TSH - 0.19 - 06/12/2012. 9.  Liver with 70% steatosis changes. Biopsy done during his lap chole on 05/11/2012.  He has a NASH score of 8.   Significant improvement of liver disease on most recent laparoscopy/liver biopsy - 12/25/2012   10.  Biliary dyskinesia - S/P lap chole - 05/11/2012 - D. Lynanne Delgreco 11. Pruritis ani  Somewhat better.   12.  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 Coralie Common about 05/22/2013  He did very well from this.  Chief Complaint  Patient presents with  . Lap Band Fill   REFERRING PHYSICIAN:  Dr. Doretha Sou  HISTORY OF PRESENT ILLNESS: Paul Shannon is a 48 y.o. (DOB: 1966-11-22)  white male whose primary care physician is [name],[name], MD and comes to me follow for follow up of lap band placement. He comes by himself.  The patient is doing well. He has some resistance from the Lap band.   He has put on weight since I last saw him, about 8 pounds.  But he admits to eating too much ice cream.  He knows that no matter what I do to the lap band, the band will not stop ice cream. He saw Dr. Lenna Gilford in March and all his labs and tests were okay. He is doing well enough and stable enough that I will see him in one year.  Weight loss/Liver  history: He understands the various options of weight loss surgery.  He has tried to diet, mainly limiting sugar intake and carbohydrate intake, and has had some modest success.  His father died in his 71's from cirrhosis, so this is driving him to consider weight loss surgery.  Much of the impetus for his interest in bariatric surgery is the relation of his weight to his liver disease.  He realizes the seriousness of progressive liver disease.  He said he has letters of support for bariatric surgery from Drs. Lenna Gilford and Deatra Ina.   Past Medical History  Diagnosis Date  . Hypertension   . Unspecified hypothyroidism   . Overweight(278.02)   . Esophageal reflux   . Irritable bowel syndrome   . Unspecified hemorrhoids without mention of complication   . Fatty liver     Dr Earlean Shawl   . Diverticulitis   . Arthritis   . History of kidney stones   . Biliary dyskinesia   . Anxiety state, unspecified yrs ago     Current Outpatient Prescriptions  Medication Sig Dispense Refill  . aspirin 81 MG tablet Take 81 mg by mouth daily with breakfast.       .  hydrocortisone (ANUSOL-HC) 2.5 % rectal cream Place 1 application rectally 2 (two) times daily.  30 g  6  . levothyroxine (SYNTHROID, LEVOTHROID) 175 MCG tablet Take 1 tablet (175 mcg total) by mouth daily before breakfast.  30 tablet  6  . Multiple Vitamin (MULTIVITAMIN) capsule Take 1 capsule by mouth daily.        No current facility-administered medications for this visit.    Allergies  Allergen Reactions  . Niacin     REACTION: pt states INTOL w/ flushing  . Other     Red dye=rash   . Penicillins     REACTION: childhood - extreme rash  . Rosuvastatin     REACTION: pt states INTOL   REVIEW OF SYSTEMS:  Cardiac:  Has history of hypertension since about 2005.  No history of seeing a cardiologist. Endocrine:  No diabetes. On thyroid replacement x 10 years. Gastrointestinal:  Liver biopsy with steatosis.  Colonoscopy in 2012 by Dr.  Deatra Ina. Urologic:  History of kidney stones.  Last attack 5 years ago. Musculoskeletal:  Right knee 2010 by Drs Murphy/Wainer. Psych:  Dr. Dayle Points - 09/11/2012  SOCIAL and FAMILY HISTORY: Married.  2 children, ages 56 and 5. 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  PHYSICAL EXAM: BP 134/80  Pulse 78  Ht 5\' 9"  (1.753 m)  Wt 194 lb (87.998 kg)  BMI 28.64 kg/m2  General: WN WM who is alert and generally healthy appearing. Has a white goatee. Heart:  RRR Lungs: Clear and symmetric. Abdomen: Soft. No mass.  No hernia. Normal bowel sounds.  Well healed laparoscopic abdominal scars.  DATA REVIEWED: Notes in Epic.  See 01/10/2013 note for photos of his liver May 2013 and in Jan 2014.  Alphonsa Overall, MD,  [facility], [address]  [place], [place]    [place] Phone:  [phone] FAX:  [phone]

## 2014-07-15 ENCOUNTER — Telehealth: Payer: Self-pay | Admitting: Pulmonary Disease

## 2014-07-15 MED ORDER — ZOLPIDEM TARTRATE 10 MG PO TABS: 10.0000 mg | ORAL_TABLET | Freq: Every evening | ORAL | Status: DC | PRN

## 2014-07-15 NOTE — Telephone Encounter (Signed)
RX called into [facility]. Pt aware. Nothing further needed

## 2014-07-15 NOTE — Telephone Encounter (Signed)
Per SN---  Ok to call in zolpidem 10 mg  #30  1 po qhs prn sleep.  thanks

## 2014-07-15 NOTE — Telephone Encounter (Signed)
Pt c/o increased difficulty sleeping at night -- requesting something be called into the pharmacy Pt reports having some stressful issues in his life currently which are causing him to miss sleep. OTC PM sleep aids are not giving relief.  Requests something be phoned into pharmacy-- [place], [address] Allergies  Allergen Reactions  . Niacin     REACTION: pt states INTOL w/ flushing  . Other     Red dye=rash   . Penicillins     REACTION: childhood - extreme rash  . Rosuvastatin     REACTION: pt states INTOL   Please advise Dr Lenna Gilford. Thanks.   Current Outpatient Prescriptions on File Prior to Visit  Medication Sig Dispense Refill  . aspirin 81 MG tablet Take 81 mg by mouth daily with breakfast.       . hydrocortisone (ANUSOL-HC) 2.5 % rectal cream Place 1 application rectally 2 (two) times daily.  30 g  6  . levothyroxine (SYNTHROID, LEVOTHROID) 175 MCG tablet Take 1 tablet (175 mcg total) by mouth daily before breakfast.  30 tablet  6  . Multiple Vitamin (MULTIVITAMIN) capsule Take 1 capsule by mouth daily.        No current facility-administered medications on file prior to visit.

## 2014-10-27 ENCOUNTER — Other Ambulatory Visit: Payer: Self-pay | Admitting: Pulmonary Disease

## 2014-12-25 ENCOUNTER — Other Ambulatory Visit: Payer: Self-pay | Admitting: Pulmonary Disease

## 2015-01-28 ENCOUNTER — Other Ambulatory Visit: Payer: Self-pay | Admitting: Pulmonary Disease

## 2015-03-23 ENCOUNTER — Telehealth: Payer: Self-pay | Admitting: Pulmonary Disease

## 2015-03-23 NOTE — Telephone Encounter (Signed)
Patient requesting an appointment for a physical.    SN - ok to schedule appointment?  Please advise.

## 2015-03-26 NOTE — Telephone Encounter (Signed)
Appt scheduled. Nothing further is needed at this time

## 2015-04-06 ENCOUNTER — Telehealth: Payer: Self-pay | Admitting: *Deleted

## 2015-04-06 ENCOUNTER — Other Ambulatory Visit (INDEPENDENT_AMBULATORY_CARE_PROVIDER_SITE_OTHER): Payer: PRIVATE HEALTH INSURANCE

## 2015-04-06 DIAGNOSIS — Z0000 Encounter for general adult medical examination without abnormal findings: Secondary | ICD-10-CM | POA: Diagnosis not present

## 2015-04-06 LAB — CBC WITH DIFFERENTIAL/PLATELET
Basophils Absolute: 0 10*3/uL (ref 0.0–0.1)
Basophils Relative: 0.6 % (ref 0.0–3.0)
Eosinophils Absolute: 0.1 10*3/uL (ref 0.0–0.7)
Eosinophils Relative: 3.1 % (ref 0.0–5.0)
HCT: 47.3 % (ref 39.0–52.0)
HEMOGLOBIN: 16.6 g/dL (ref 13.0–17.0)
LYMPHS ABS: 1.7 10*3/uL (ref 0.7–4.0)
Lymphocytes Relative: 36.8 % (ref 12.0–46.0)
MCHC: 35 g/dL (ref 30.0–36.0)
MCV: 93 fl (ref 78.0–100.0)
Monocytes Absolute: 0.5 10*3/uL (ref 0.1–1.0)
Monocytes Relative: 9.8 % (ref 3.0–12.0)
Neutro Abs: 2.3 10*3/uL (ref 1.4–7.7)
Neutrophils Relative %: 49.7 % (ref 43.0–77.0)
Platelets: 229 10*3/uL (ref 150.0–400.0)
RBC: 5.09 Mil/uL (ref 4.22–5.81)
RDW: 13.6 % (ref 11.5–15.5)
WBC: 4.7 10*3/uL (ref 4.0–10.5)

## 2015-04-06 LAB — LIPID PANEL
CHOLESTEROL: 146 mg/dL (ref 0–200)
HDL: 50.1 mg/dL (ref >39.00)
LDL Cholesterol: 77 mg/dL (ref 0–99)
NonHDL: 95.9
Total CHOL/HDL Ratio: 3
Triglycerides: 95 mg/dL (ref 0.0–149.0)
VLDL: 19 mg/dL (ref 0.0–40.0)

## 2015-04-06 LAB — BASIC METABOLIC PANEL
BUN: 12 mg/dL (ref 6–23)
CALCIUM: 9.8 mg/dL (ref 8.4–10.5)
CO2: 31 mEq/L (ref 19–32)
Chloride: 101 mEq/L (ref 96–112)
Creatinine, Ser: 1 mg/dL (ref 0.40–1.50)
GFR: 84.69 mL/min (ref >60.00)
Glucose, Bld: 65 mg/dL — ABNORMAL LOW (ref 70–99)
Potassium: 3.7 mEq/L (ref 3.5–5.1)
Sodium: 139 mEq/L (ref 135–145)

## 2015-04-06 LAB — HEPATIC FUNCTION PANEL
ALT: 38 U/L (ref 0–53)
AST: 30 U/L (ref 0–37)
Albumin: 4.9 g/dL (ref 3.5–5.2)
Alkaline Phosphatase: 90 U/L (ref 39–117)
BILIRUBIN TOTAL: 1.3 mg/dL — AB (ref 0.2–1.2)
Bilirubin, Direct: 0.3 mg/dL (ref 0.0–0.3)
TOTAL PROTEIN: 7.9 g/dL (ref 6.0–8.3)

## 2015-04-06 LAB — TSH: TSH: 52.33 u[IU]/mL — AB (ref 0.35–4.50)

## 2015-04-06 LAB — PSA: PSA: 0.95 ng/mL (ref 0.10–4.00)

## 2015-04-06 NOTE — Telephone Encounter (Signed)
Labs entered for pt per Dr. Lenna Gilford for appt.

## 2015-04-07 ENCOUNTER — Ambulatory Visit (INDEPENDENT_AMBULATORY_CARE_PROVIDER_SITE_OTHER)
Admission: RE | Admit: 2015-04-07 | Discharge: 2015-04-07 | Disposition: A | Discharge status: Home / Self Care | Payer: PRIVATE HEALTH INSURANCE | Source: Ambulatory Visit | Attending: Pulmonary Disease | Admitting: Pulmonary Disease

## 2015-04-07 ENCOUNTER — Encounter: Payer: Self-pay | Admitting: Pulmonary Disease

## 2015-04-07 ENCOUNTER — Ambulatory Visit (INDEPENDENT_AMBULATORY_CARE_PROVIDER_SITE_OTHER): Payer: PRIVATE HEALTH INSURANCE | Admitting: Pulmonary Disease

## 2015-04-07 VITALS — BP 128/76 | HR 63 | Temp 97.9°F | Wt 198.0 lb

## 2015-04-07 DIAGNOSIS — K760 Fatty (change of) liver, not elsewhere classified: Secondary | ICD-10-CM

## 2015-04-07 DIAGNOSIS — Z0000 Encounter for general adult medical examination without abnormal findings: Secondary | ICD-10-CM

## 2015-04-07 DIAGNOSIS — K5730 Diverticulosis of large intestine without perforation or abscess without bleeding: Secondary | ICD-10-CM | POA: Diagnosis not present

## 2015-04-07 DIAGNOSIS — E039 Hypothyroidism, unspecified: Secondary | ICD-10-CM

## 2015-04-07 DIAGNOSIS — K219 Gastro-esophageal reflux disease without esophagitis: Secondary | ICD-10-CM | POA: Diagnosis not present

## 2015-04-07 DIAGNOSIS — M150 Primary generalized (osteo)arthritis: Secondary | ICD-10-CM

## 2015-04-07 DIAGNOSIS — M159 Polyosteoarthritis, unspecified: Secondary | ICD-10-CM

## 2015-04-07 NOTE — Patient Instructions (Signed)
Today we updated your med list in our EPIC system...    Continue your current medications the same...  Today we reviewed your recent FASTING blood work...    I cannot explain your elevated TSH in light of the Levothyroid 156mcg dose per day???    We will arrange for an ENDOCRINOLOGY consultation...  We also did your follow up CXR & EKG...    We will contact you w/ the results when available...   Keep up the great work w/ diet & exercise...  Call for any questions...  Let's plan a follow up visit in 52yr, sooner if needed for problems.Marland KitchenMarland Kitchen

## 2015-04-07 NOTE — Progress Notes (Signed)
HPI  Review of Systems  Physical Exam  Subjective:    Patient ID: Paul Shannon, male    DOB: 1966/03/07, 49 y.o.   MRN: [id number]  HPI 49 y/o WM here for a follow up visit & CPX... he has multiple medical problems as noted below...   ~  He was hospitalized 5/12 w/ acute diverticulitis, treated w/ Cipro & Flagyl & slowly improved; CT Abd revealed acute diverticulitis in prox sigmoid w/ perforation & pericolonic inflamm, and fatty liver disease; CCS consult by Paul Shannon w/ rec for med rx & no surg intervention...   ~  July 2. 2013:  1 year ROV & CPX> Paul Shannon developed abd pain 3/13 & had eval Paul Shannon w/ an abn hepatobiliary scan showing an abn low ejection fraction of 2% (norm>30%); He had elev LFTs as well but this was felt to be more likely related to his known Hepatic Steatosis; he was referred to Paul Shannon for Cholecystectomy & intraoperative liver biopsy to further investigate the status of his liver disease (recall hx of father passing away w/ cirrhosis due to this condition) as his last bx was in the 63's;  He did well w/ surgery & had neg intraop cholangiogram, & an uneventful recovery;  Liver Bx however showed progressive HepSteatosis- 70% steatosis & total NASH score of 8 (out of poss 9);  He has a follow up appt w/ Paul Shannon to consider his options as he is one sm step away from cirrhosis at this juncture & Bariatric surg may be an option...    We reviewed prob list, meds, xrays and labs> see below for updates>>  LABS 5/13 in hosp:  Elev LFTs as noted;  CBC- ok   LABS 7/13:  FLP- ok on diet alone;  Chems- ok x persist incr LFTs due to steatosis;  TSH= 0.19 on Synth200 & we decided to slowly wean...  ~  January 21, 2013:  62moROV & FDonteriusis s/p Lap Band surg for his Hepatic Steatosis & pos FamHx cirrhosis from this condition (his insurance wouldn't cover a gastric procedure); he has already lost over 30# down to 202# today & he says "I never would have known how bad I really felt til I  could compare to how good I feel now" and he states he is now eating just 10% of what he ate before...    Presents w/ small knot on scrotum- ?local skin infection, not very severe, no regional lymphadenopathy, etc; we discussed Rx w/ sitz baths, Neosporin, course of empiric SeptraDSBid (allergy to PCN); to Urology if this is not resolving...    Requesting Rx for Hem's as well> we discussed sitz baths, Miralax/ Sen[place]ot, AnusolHC cream as needed... We reviewed prob list, meds, xrays and labs> see below for updates >>   ~  January 30, 2013:  Annual CPX> Paul Shannon's scrotal lesion has resolved w/ Sitz, topical Rx & SeptraDS... No new complaints or concerns, we reviewed the following medical problems during today's office visit >>     HBP> on ASA81, Hyzaar100-25, K20Bid; BP= 108/70 assoc w/ his wt reduction of 36# total so far; he denies CP, palpit, SOB, edema; we decided to decr the Hyzaar to 1/2 tab daily & cut the K20 to one daily...    Lipids> on diet alone; wt is down as noted after LapBand surg; FLP shows TChol 135, TG 55, HDL 55, LDL 69    Hypothyroid> on Levothyroid 2066m daily; hx of suspect med compliance in past; clinically & biochem euthyroid  but TSH 2/14 = 112, he states taking it every day, call to pharm indicates a lapse in Rx but filled #90 12/09/12, didn't bring bottles; recheck TSH= 66 & I called Paul Shannon (he agreed that compliance is suspect); Rec to take the 261mg pill every day 1st thing in the AM on empty stomach & don't eat for 1h...    Overweight> he had Lap Band surg 12/26/12 by Paul Shannon doing much better by his history & he est that he is eating 10% of what he ate before; wt is down 36# from our peak to 199# today...    GI-GERD, Divertics, IBS, Hems, s/p GB> on AnusolHC cream as needed; not requiring PPI rx etc; he denies abd pain, n/v, c/d, blood seen...    Fatty Liver Dis> Paul Shannon mentioned that liver looked some better at the time of his surg; LFTs much improved & now WNL..[name]     DJD, right knee discomfort> hx right knee arthroscopy 10/11 by Paul Shannon; stable 7 he avoids NSAIDs...     Anxiety> prev on alpraz0.5 prn but he has been off all meds for some time- doing well w/o anxiety symptoms... We reviewed prob list, meds, xrays and labs> see below for updates >>   LABS 2/14:  FLP- at goals on diet alone;  Chems- wnl & LFTs now normal;  CBC- wnl;  TSH=112 & repeat=66 on Levothy2042m/d > asked to take it every AM, on empty stomach, don't eat for 1H, etc...  ~  February 12, 2014:  Yearly ROV & CPX... Paul Shannon's weight was down to 175# after his LapBand surg, but he gained up to 199# last yr & now at 194# and his LFTs remain wnl... He has a BrTeaching laboratory technicianMRonalee Shannon/ elevLFTs & steatosis; and another Paul Shannon w/ [place]...  We reviewed the following medical problems during today's office visit >>     HBP> on ASA81, & off all BP meds; BP= 136/88 & he denies CP, palpit, SOB, edema; we discussed that he might have to restart BP meds if wt increases...    Lipids> on diet alone; wt is down as noted after LapBand surg; FLP 3/15 shows TChol 101, TG 52, HDL 37, LDL 54    Hypothyroid> on Levothyroid 20025mdaily; hx of suspect med compliance in past; clinically & biochem euthyroid but TSH 3/15 = 0.16, we decided to inch his dose down to 175m23m & recheck 9mo,39moinded to take it daily!    Overweight> he had Lap Band surg 12/26/12 by Paul Shannon much better by his history & he est that he is eating 10% of what he ate before; wt is down 42# from our peak to 194# today...    GI-GERD, Divertics, IBS, Hems, s/p GB> on AnusolHC cream as needed; not requiring PPI rx etc; he denies abd pain, n/v, c/d, blood seen...    Fatty Liver Dis> Paul Shannon mentioned that liver looked some better at the time of his surg; LFTs much improved & now remain WNL...   Marland KitchenDJD, right knee discomfort> hx right knee arthroscopy 10/11 by Paul Shannon; stable & he avoids NSAIDs...     Anxiety> prev on Alpraz0.5 prn but he has been off all meds  for some time- doing well w/o anxiety symptoms... We reviewed prob list, meds, xrays and labs> see below for updates >>   LABS 3/15:  FLP- at goals on diet alone;  Chems- wnl;  CBC- wnl;  TSH=0.16...  ~  April 07, 2015:  18mo 22mo CPX> Paul Shannon rTungts  doing well, no new complaints or concerns; his weight is stable, BMI~28, appetite good, etc; he has to chew hard foods like steaks very well to keep them from getting "hung-up" & if this happens he can drink water & then vomit it all back up (occurs every 4-40modepending on what he eats)...  He had right foot drop after the lap band surg- [facility]; he reports that MRI was neg but NCV was abn & surg on lateral right leg near fibular head relieved pressure on the nerve 7 cured the problem... We reviewed the following medical problems during today's office visit >>     HBP> prev on ASA81 (he stopped), & no BP meds; BP= 128/76 & he denies CP, palpit, SOB, edema; we discussed that he might have to restart BP meds if wt increases...    Lipids> on diet alone; wt is down as noted after LapBand surg & stable ~200#; FLP 4/16 shows TChol 146, TG 95, HDL 50, LDL 77    Hypothyroid> on Levothyroid 1735m daily; hx of suspect med compliance in past; clinically & biochem euthyroid but TSH 4/16 = 52, he swears he's taking it Qam on empty stomach & npo for 3060m we will refer to Endocrine to figure this out    Overweight> he [facility] by his history & he est that he is eating 10% of what he ate before; wt is down ~40# from his peak...    GI-GERD, Divertics, IBS, Hems, s/p GB> on AnusolHC cream as needed; not requiring PPI rx etc; he denies abd pain, n/v, c/d, blood seen...    Fatty Liver Dis> Paul Shannon mentioned that liver looked some better at the time of his surg; LFTs much improved & now remain WNL after lap band & wt loss...    DJD, right knee discomfort> hx right knee  arthroscopy 10/11 by Paul Shannon; stable & he avoids NSAIDs... He had right foot drop after lap band surg & required NS by DrDJones to move nerve near fibular head w/ complete resolution...    Anxiety> prev on Alpraz0.5 prn but he has been off all meds for some time- doing well w/o anxiety symptoms... We reviewed prob list, meds, xrays and labs> see below for updates >>   CXR 4/16 showed norm heart size, clear lungs, NAD... Marland KitchenMarland KitchenEKG 4/16 showed SBrady, rate56, LAD, IRBBB, otherw NAD...  LABS 4/16:  FLP- at goals on diet alone;  Chems- wnl w/ norm LFTs;  CBC- wnl;  TSH=52 on Levothy175!  PSA=0.95... PLAN>>  He continues to do well s/p lap band wt reduction surg & LFTs have remained wnl;  The volitility in his TFTs are unexplained- prev on Levothy200 his TSH was 0.16, decreased to 175m25m and TSH=52!  He states taking it every day 1st thing in the AM & NPO for 30mi32mter dosing- we will refer to Endocrine to sort this out...          Problem List:  HYPERTENSION (ICD-401.9) - controlled on HYZAAR 100-25 daily... BP=116/76 today, & he states OK at home ~130's to 140/ 80's-90... denies HA, fatigue, visual changes, CP, palipit, dizziness, syncope, dyspnea, edema, etc...  ~  CXR 4/11 showed low lung vol's but clear, normal Cor, NAD. ~  EKG showed NSR rate 60, no signif STTWA's, WNL. ~  CXR 5/13 showed normal heart size, clear lungs, NAD... ~  2/14: on ASA81, Hyzaar100-25, K20Bid;  BP= 108/70 assoc w/ his wt reduction of 36# total so far; he denies CP, palpit, SOB, edema; we decided to decr the Hyzaar to 1/2 tab daily & cut the K20 to one daily... ~  3/15: on ASA81, & off all BP meds; BP= 136/88 & he denies CP, palpit, SOB, edema; we discussed that he might have to restart BP meds if wt increases... ~  4/16: prev on ASA81 (he stopped), & no BP meds; BP= 128/76 & he denies CP, palpit, SOB, edema; we discussed that he might have to restart BP meds if wt increases...  HYPERCHOLESTEROLEMIA (ICD-272.0) - he has  refused meds preferring to attempt control on diet alone... he was intol to Niacin in past w/ flushing... intol to Crestor10 when tried in Sep09... ~  [place] 3/08 showed TChol 143, TG 190, HDL 30, LDL 75... rec to start fibrate- he declined meds. ~  [place] 9/09 showed TChol 143, TG 123, HDL 20, LDL 99... rec to start Crestor39m/d- he agrees. ~  pt states that he didn't tolerate the Crestor10 either, therefore stopped it. ~  FLP 4/11 showed TChol 130, TG 94, HDL 33, LDL 79... much improved, on diet alone. ~  FLP 6/12 on diet alone (after divertic episode) showed TChol 131, TG 112, HDL 30, LDL 78... rec incr exercise. ~  FLP 7/13 on diet alone showed TChol 123, TG 123, HDL 34, LDL 65 ~  FLP 2/14 on diet alone w/ 36# wt reduction so far showed TChol 135, TG 55, HDL 55, LDL 69 ~  FLP 3/15 on diet alone showed TChol 101, TG 52, HDL 37, LDL 54  HYPOTHYROIDISM (ICD-244.9) - on SYNTHROID 2097m/d w/ ?compliance issues...  ~  labs 3/08 showed TSH=20.5 on Synthroid 17514md and dose increased to 200. ~  labs 9/09 showed TSH= 6.65 on 200m66m... continue same med. ~  ran out of meds Feb10 and didn't refill- labs 3/10 showed TSH= 93... rec> restart LEVOTHY200. ~  labs 4/11 showed TSH= 0.61... clinically euthyroid, may need to cut back dose. ~  Labs 6/12 showed TSH= 1.09 ~  Labs 7/13 on Synthroid200 showed TSH= 0.19 & we decided to decr the Synthroid to 175mc74m.. ~  Labs 2/14 on Synthroid200 showed TSH=112, repeat=66; we discussed taking med every day, 1st thing in the AM on empty stomach, don't eat for 1h; I discussed this by phone w/ Paul Shannon & he concurred that med compliace was the most likely explanation... ~  Labs 3/15 on Synthroid200 showed TSH=0.16 and we decided to decr dose to 175mcg56m  Labs 4/16 on Synthroid175 showed TSH=52!  ?what is going on? He assures me that he takes it every day, 1st thing in the AM & npo for 30min 17mr dosing... We will refer to ENDOCRINE for their review.  OVERWEIGHT  (ICD-278.02) - since we first met Adis in 1997 his weight has been 220-230 range... occas down to 210-215, and once down to 205# in 2001... highest weight has been 232#... ~  wMarland Kitchenight 3/10 = 230# ~  weight 4/11 = 228# ~  Weight 6/12 = 221#... This was after his diverticulitis episode. ~  Weight 7/13 = 235# ~  Weight 2/14 = 202#... s/p Lap Band surg 1/14. ~  Weight 2/14 = 199# ~  Weight 3/15 = 194# ~  Weight 4/16 = 198#  GERD (ICD-530.81) - uses OTC Prilosec as needed. ~  EGD 3/13 by Paul Shannon showed a benign nodule in the distal esoph, otherw neg...  DIVERTICULITIS>  Adm to  hosp 5/12 w/ acute sigmoid diverticulitis;  treated w/ Cipro & Flagyl & slowly improved; CT Abd revealed acute diverticulitis in prox sigmoid w/ perforation & pericolonic inflamm, and fatty liver disease; CCS consult by Paul Shannon w/ rec for med rx & no surg intervention; he has a post hosp follow up appt w/ Paul Shannon ==> pending 6/12 ~  Colonoscopy 6/12 by Paul Shannon showed mod divertics in the sigmoid, otherw neg & f/u planned 68yr...  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 he uses OTC PrepH.  FATTY LIVER DISEASE (ICD-571.8) -  he has hepatic steatosis w/ [facility]... there is a pos family hx of cirrhosis in his father who died from this disease... he has been counselled on weight reduction and told to avoid hepatotoxins including alcohol...  he has discussed Hepatic Steatosis/ Fatty Liver Disease w/ GI, and we have provided him w/ mult hand-out about this condition...  he understands that it can lead to cirrhosis and death from liver failure... he understands that he must get his weight down, control his fat intake, and follow the FLP & LFT's carefully... ~  LFTs 3/08 showed SGOT= 61, SGPT= 100... rec> get wt down. ~  LFTs 9/09 showed SGOT= 43, SGPT= 62... improved. ~  LFTs 2/10 showed SGOT= 29, SGPT= 39... normal! ~  LFTs 4/11  showed SGOT= 60, SGPT= 97... must diet & get wt down! 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 ~  LFTs 6/13 showed SGOT= 121, SGPT= 164... Pt referred back to GI for his severe hepatic steatosis... ~  LFTs 1/14 s/p Lap Band surg for his steatohepatitis showed SGOT=47, SGPT=72...  ~  LFTs 2/14 (wt=199#) showed SGOT=38, SGPT=34; much improved... ~  LFTs have remained WNL since his lap-band surg & weight loss to <200#...  Small lesion in the skin on the underside of the scrotum >> presented 2/14 w/ this superfic infection; treated w/ Sitz, Neosporin, SeptraDSBid... ~  2/14: pt reports much better & lesion resolved on Rx...  DJD/ Right Knee arthroscopy 10/11 by Paul Shannon for menicus tears & chondromalacia... Hx right foot drop after lap-band surg w/ eval by Paul Shannon & NS-DrDJones w/ neg MRI back but abn NCVs; ultimately had surg on the nerve right lat knee/fibular head & all his symptoms resolved...  ANXIETY (ICD-300.00) - uses Alprazolam Prn...  DERM - mult tattoos...  Health Maintenance -  ~  GU:  age 49 PSA checked 2/10 due to symptoms and normal at 0.85... ~  GI:  had FlexSig in 1998 for IBS symptoms by [facility]& it was neg... GI f/u pending w/ Paul Shannon s/p diverticulitis. ~  Immunizations:  ? last Tetanus shot (he agrees to TDAP today)... hasn't had Pneumovax, Flu shots (refuses), or HepB series...  << NOTE: GIVEN COM[facility]P [facility] & [facility] B [facility]ACCINE [facility] >>   Past Surgical History  Procedure Laterality Date  . Knee surgery  2011    right knee   . Liver biopsy    . Colonoscopy    . Breath tek h pylori  10/02/2012    Procedure: BREATH TEK H PYLORI;  Surgeon: DShann Medal MD;  [facility];  Service: General;  Laterality: N/A;  . Tonsillectomy and adenoidectomy  as child  .  Cholecystectomy  05/11/2012  . Laparoscopic  gastric banding  12/25/2012    Procedure: LAPAROSCOPIC GASTRIC BANDING;  Surgeon: Shann Medal, MD;  Location: [facility];  Service: General;  Laterality: N/A;  Lap Band Placement  . Mesh applied to lap port  12/25/2012    Procedure: MESH APPLIED TO LAP PORT;  Surgeon: Shann Medal, MD;  Location: [facility];  Service: General;;  . Liver biopsy  12/25/2012    Procedure: LIVER BIOPSY;  Surgeon: Shann Medal, MD;  Location: [facility];  Service: General;;    Outpatient Encounter Prescriptions as of 04/07/2015  Medication Sig  . diphenhydramine-acetaminophen (TYLENOL PM) 25-500 MG TABS Take 1 tablet by mouth at bedtime as needed.  . hydrocortisone (ANUSOL-HC) 2.5 % rectal cream Place 1 application rectally 2 (two) times daily.  [name] levothyroxine (SYNTHROID, LEVOTHROID) 175 MCG tablet TAKE 1 TABLET BY MOUTH EVERY DAY BEFORE BREAKFAST  . Melatonin 1 MG TABS Take 1 mg by mouth at bedtime.  . Multiple Vitamin (MULTIVITAMIN) capsule Take 1 capsule by mouth daily.   [name] aspirin 81 MG tablet Take 81 mg by mouth daily with breakfast.   . zolpidem (AMBIEN) 10 MG tablet Take 1 tablet (10 mg total) by mouth at bedtime as needed for sleep.    Allergies  Allergen Reactions  . Niacin     REACTION: pt states INTOL w/ flushing  . Other     Red dye=rash   . Penicillins     REACTION: childhood - extreme rash  . Rosuvastatin     REACTION: pt states INTOL    Current Medications, Allergies, Past Medical History, Past Surgical History, Family History, and Social History were reviewed in [employer] record.   Review of Systems        The patient complains of joint pain and paresthesias.  The patient denies fever, chills, sweats, anorexia, fatigue, weakness, malaise, weight loss, sleep disorder, blurring, diplopia, eye irritation, eye discharge, vision loss, eye pain, photophobia, earache, ear discharge, tinnitus, decreased hearing, nasal congestion, nosebleeds, sore throat, hoarseness, chest  pain, palpitations, syncope, dyspnea on exertion, orthopnea, PND, peripheral edema, cough, dyspnea at rest, excessive sputum, hemoptysis, wheezing, pleurisy, nausea, vomiting, diarrhea, constipation, change in bowel habits, abdominal pain, melena, hematochezia, jaundice, gas/bloating, indigestion/heartburn, dysphagia, odynophagia, dysuria, hematuria, urinary frequency, urinary hesitancy, nocturia, incontinence, back pain, joint swelling, muscle cramps, muscle weakness, stiffness, arthritis, sciatica, restless legs, leg pain at night, leg pain with exertion, rash, itching, dryness, suspicious lesions, paralysis, seizures, tremors, vertigo, transient blindness, frequent falls, frequent headaches, difficulty walking, depression, anxiety, memory loss, confusion, cold intolerance, heat intolerance, polydipsia, polyphagia, polyuria, unusual weight change, abnormal bruising, bleeding, enlarged lymph nodes, urticaria, allergic rash, hay fever, and recurrent infections.     Objective:   Physical Exam      WD, Overweight, 48 y/o WM in NAD... GENERAL:  Alert & oriented; pleasant & cooperative... HEENT:  [place]/AT, EOM-wnl, PERRLA, EACs-clear, TMs-wnl, NOSE-clear, THROAT-clear & wnl... NECK:  Supple w/ full ROM; no JVD; normal carotid impulses w/o bruits; no thyromegaly or nodules palpated; no lymphadenopathy... CHEST:  Clear to P & A; without wheezes/ rales/ or rhonchi heard... HEART:  Regular Rhythm; without murmurs/ rubs/ or gallops detected... ABDOMEN:  Soft & min tender LLQ; normal bowel sounds; no organomegaly or masses palpated...  RECTAL:  neg- prostate 2+ normal, stool heme neg, few ext hem's noted... EXT: without deformities, mild arthritic changes;   NEURO:  CN's intact; motor testing normal; sensory testing normal; gait normal &  balance OK. DERM:  No lesions noted; no rash, several tatoos...   Assessment & Plan:    HBP>  Controlled off all BP meds on diet alone since he's lost the  weight...  CHOL>  Improved w/ wt reduction, advised to keep up the wt reduction efforts & low fat diet...  HYPOTHYROID>  Labs have been up & down on dose of 175-284mg/d, it makes no sense since he claims he's taking it every day 1st thing in the AM, & npo x 36m after dosing- we will refer to Endocrine.  OBESITY>  He is now s/[facility] ~40# & he feels much better...  GI>  GERD/ Divertics/ IBS/ Fatty Liver>  Followed by DrDemetra Shineror GI...  DIVERTICULITIS>  AS ABOVE- acute diverticulitis 5/12 w/ perf & pericolonic inflamm, improved w/ antibiotic Rx & f/u colon 6/12 was otherw neg...  NAFLD>  This is his most serious medical condition w/ liver bx proving marked progression over the last 10+yrs; his father died from this disease; he had bariatric surg 1/14 7 has lost >30#, feeling better & LFTs improved... We will continue to follow...  DJD> s/p right knee arthroscopy by Paul Shannon 10/11...   Patient's Medications  New Prescriptions   No medications on file  Previous Medications   ASPIRIN 81 MG TABLET    Take 81 mg by mouth daily with breakfast.    DIPHENHYDRAMINE-ACETAMINOPHEN (TYLENOL PM) 25-500 MG TABS    Take 1 tablet by mouth at bedtime as needed.   HYDROCORTISONE (ANUSOL-HC) 2.5 % RECTAL CREAM    Place 1 application rectally 2 (two) times daily.   LEVOTHYROXINE (SYNTHROID, LEVOTHROID) 175 MCG TABLET    TAKE 1 TABLET BY MOUTH EVERY DAY BEFORE BREAKFAST   MELATONIN 1 MG TABS    Take 1 mg by mouth at bedtime.   MULTIPLE VITAMIN (MULTIVITAMIN) CAPSULE    Take 1 capsule by mouth daily.    ZOLPIDEM (AMBIEN) 10 MG TABLET    Take 1 tablet (10 mg total) by mouth at bedtime as needed for sleep.  Modified Medications   No medications on file  Discontinued Medications   No medications on file

## 2015-04-08 ENCOUNTER — Other Ambulatory Visit: Payer: Self-pay | Admitting: Pulmonary Disease

## 2015-04-08 MED ORDER — LEVOTHYROXINE SODIUM 175 MCG PO TABS
ORAL_TABLET | ORAL | Status: DC
Start: 2015-04-08 — End: 2015-04-28

## 2015-04-08 NOTE — Telephone Encounter (Signed)
Pt called and requested a refill on his synthroid. Per SN, ok to refill. Refill sent to pharmacy electronically with additional refills.

## 2015-04-19 ENCOUNTER — Encounter: Payer: Self-pay | Admitting: Pulmonary Disease

## 2015-04-20 NOTE — Telephone Encounter (Signed)
SN please advise. Thanks.  

## 2015-04-28 ENCOUNTER — Ambulatory Visit (INDEPENDENT_AMBULATORY_CARE_PROVIDER_SITE_OTHER): Payer: PRIVATE HEALTH INSURANCE | Admitting: Endocrinology

## 2015-04-28 ENCOUNTER — Encounter: Payer: Self-pay | Admitting: Endocrinology

## 2015-04-28 VITALS — BP 142/84 | HR 79 | Temp 98.2°F | Resp 16 | Ht 69.0 in | Wt 195.6 lb

## 2015-04-28 DIAGNOSIS — E038 Other specified hypothyroidism: Secondary | ICD-10-CM | POA: Diagnosis not present

## 2015-04-28 MED ORDER — LEVOTHYROXINE SODIUM 200 MCG PO TABS: 200.0000 ug | ORAL_TABLET | Freq: Every day | ORAL | Status: DC

## 2015-04-28 NOTE — Progress Notes (Signed)
Patient ID: Paul Shannon, male   DOB: [birth year], 49 y.o.   MRN: [id number]            Reason for Appointment:  Hypothyroidism, new visit    History of Present Illness:   The Hypothyroidism was first diagnosed in ?  2008  At the time of diagnosis the patient was having fatigue but he thinks that his TSH was tested probably because of routine blood work being done At that time his TSH was 20.5. He thinks he was started on relatively lower doses of levothyroxine and his fatigue did improve.  Over the last several years his TSH has been inconsistent especially in the last 2-3 years  Lab Results  Component Value Date   TSH 52.33* 04/06/2015   TSH 0.16* 02/10/2014   TSH 66.44* 01/30/2013   TSH 112.56 Repeated and verified X2.* 01/28/2013   TSH 0.19* 06/12/2012   TSH 1.09 05/30/2011   TSH 0.61 03/19/2010   TSH 92.90* 02/27/2009   TSH 6.65* 08/25/2008   TSH 20.48* 02/15/2007    Review of his medication history indicates that he has been taking between 175-200 g of levothyroxine  He has been having his TSH tested usually once or twice a year as above Currently he stays fatigued and this is mostly in the last 2 months.  He tends to get more tired in the afternoon and feels like taking a nap. 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           Wt Readings from Last 3 Encounters:  04/28/15 195 lb 9.6 oz (88.724 kg)  04/07/15 198 lb (89.812 kg)  06/18/14 194 lb (87.998 kg)    COMPLIANCE with his medication has been excellent with his taking the medication soon after waking up every morning However since his lap band procedure about 2 years ago he has been taking calcium and multivitamin  supplements about 20 minutes after taking his levothyroxine Has usually been taking generic medications    Past Medical History  Diagnosis Date  . Hypertension   . Unspecified  hypothyroidism   . Overweight(278.02)   . Esophageal reflux   . Irritable bowel syndrome   . Unspecified hemorrhoids without mention of complication   . Fatty liver     Dr Earlean Shawl   . Diverticulitis   . Arthritis   . History of kidney stones   . Biliary dyskinesia   . Anxiety state, unspecified yrs ago    His PAST history significant for fatty liver and this was treated because of his obesity with  laparoscopic gastric banding With this procedure he lost a significant amount of weight and was able to keep it off.  Liver function subsequently have been normal  No history of hypertension since his weight loss occurred, previously was taking antihypertensive drugs  No history of diabetes     Past Surgical History  Procedure Laterality Date  . Knee surgery  2011    right knee   . Liver biopsy    . Colonoscopy    . Breath tek h pylori  10/02/2012    Procedure: BREATH TEK H PYLORI;  Surgeon: Shann Medal, MD;  Location: [facility];  Service: General;  Laterality: N/A;  . Tonsillectomy and adenoidectomy  as child  . Cholecystectomy  05/11/2012  . Laparoscopic gastric banding  12/25/2012    Procedure: LAPAROSCOPIC GASTRIC  BANDING;  Surgeon: Shann Medal, MD;  Location: [facility];  Service: General;  Laterality: N/A;  Lap Band Placement  . Mesh applied to lap port  12/25/2012    Procedure: MESH APPLIED TO LAP PORT;  Surgeon: Shann Medal, MD;  Location: [facility];  Service: General;;  . Liver biopsy  12/25/2012    Procedure: LIVER BIOPSY;  Surgeon: Shann Medal, MD;  Location: [facility];  Service: General;;    Family History  Problem Relation Age of Onset  . Heart disease Maternal Grandfather   . Diabetes Maternal Grandfather     and Father   . Kidney cancer Father   . Cancer Father     kidney  . Colon cancer Neg Hx   . Esophageal cancer Neg Hx   . Stomach cancer Neg Hx   . Rectal cancer Neg Hx     Social History:  reports that he has never smoked. He has never used  smokeless tobacco. He reports that he does not drink alcohol or use illicit drugs.  Allergies:  Allergies  Allergen Reactions  . Niacin     REACTION: pt states INTOL w/ flushing  . Other     Red dye=rash   . Penicillins     REACTION: childhood - extreme rash  . Red Dye   . Rosuvastatin     REACTION: pt states INTOL      Medication List       This list is accurate as of: 04/28/15 11:17 AM.  Always use your most recent med list.               aspirin 81 MG tablet  Take 81 mg by mouth daily with breakfast.     calcium carbonate 600 MG Tabs tablet  Commonly known as:  OS-CAL  Take 600 mg by mouth daily with breakfast.     diphenhydramine-acetaminophen 25-500 MG Tabs  Commonly known as:  TYLENOL PM  Take 1 tablet by mouth at bedtime as needed.     hydrocortisone 2.5 % rectal cream  Commonly known as:  ANUSOL-HC  Place 1 application rectally 2 (two) times daily.     levothyroxine 175 MCG tablet  Commonly known as:  SYNTHROID, LEVOTHROID  TAKE 1 TABLET BY MOUTH EVERY DAY BEFORE BREAKFAST     Melatonin 1 MG Tabs  Take 1 mg by mouth at bedtime.     multivitamin capsule  Take 1 capsule by mouth daily.        Review of Systems  Respiratory: Negative for shortness of breath.   Cardiovascular: Negative for palpitations.  Gastrointestinal: Positive for constipation. Negative for abdominal pain.  Genitourinary: Negative for frequency.  Musculoskeletal: Negative for myalgias and joint pain.  Skin: Negative for itching.  Neurological: Negative for tingling.       No numbness in hands or feet  Endo/Heme/Allergies: Negative for polydipsia.       No heat or cold intolerance. No unusual fatigue  Psychiatric/Behavioral: Negative for depression.      Examination:    BP 142/84 mmHg  Pulse 79  Temp(Src) 98.2 F (36.8 C)  Resp 16  Ht 5\' 9"  (1.753 m)  Wt 195 lb 9.6 oz (88.724 kg)  BMI 28.87 kg/m2  SpO2 97%  GENERAL:  Average build.   No pallor, clubbing,  lymphadenopathy Skin:  no rash or pigmentation.  EYES:  He has mild puffiness of his eyes and slight puffiness of his face Conjunctiva normal  ENT: Oral mucosa  and tongue normal.  THYROID:  Not palpable.  HEART:  Normal  S1 and S2; no murmur or click.  CHEST:    Lungs: Vescicular breath sounds heard equally.  No crepitations/ wheeze.  ABDOMEN:  No distention.  Liver and spleen not palpable.  No other mass or tenderness.  NEUROLOGICAL: Reflexes are bilaterally normal appearing at biceps.  JOINTS:  Normal smaller joints.   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 Does not have a goiter on exam  Although he is taking are relatively high dose of 175 g of levothyroxine his TSH has been significantly high Most likely has had some incomplete absorption of his levothyroxine with taking his calcium and iron containing vitamin within 20 minutes of taking his dose in the mornings Also unable to explain why his TSH sometimes has been slightly low with only minor changes in dosage; not clear if this is because of taking generic medication Currently doesn't appear to be symptomatic from hypothyroidism   Treatment:  He will first try to change his calcium and vitamin supplement to lunch or supper time For at least the next month he will increase the dose to 200 g If the TSH is still consistently high will increase the dose further based on his follow-up TSH in one month Given patient handout on hypothyroidism and its management   Paul Shannon 04/28/2015, 11:17 AM

## 2015-04-28 NOTE — Patient Instructions (Signed)
Take Calcium and Vitamin at dinner time

## 2015-05-29 ENCOUNTER — Other Ambulatory Visit (INDEPENDENT_AMBULATORY_CARE_PROVIDER_SITE_OTHER): Payer: PRIVATE HEALTH INSURANCE

## 2015-05-29 DIAGNOSIS — E038 Other specified hypothyroidism: Secondary | ICD-10-CM

## 2015-05-29 LAB — T4, FREE: FREE T4: 1.36 ng/dL (ref 0.60–1.60)

## 2015-05-29 LAB — TSH: TSH: 0.28 u[IU]/mL — AB (ref 0.35–4.50)

## 2015-06-01 ENCOUNTER — Encounter: Payer: Self-pay | Admitting: *Deleted

## 2015-06-01 ENCOUNTER — Ambulatory Visit: Payer: PRIVATE HEALTH INSURANCE | Admitting: Endocrinology

## 2015-06-03 ENCOUNTER — Ambulatory Visit (INDEPENDENT_AMBULATORY_CARE_PROVIDER_SITE_OTHER): Payer: PRIVATE HEALTH INSURANCE | Admitting: Endocrinology

## 2015-06-03 ENCOUNTER — Encounter: Payer: Self-pay | Admitting: Endocrinology

## 2015-06-03 VITALS — BP 142/100 | HR 68 | Temp 97.7°F | Resp 16 | Ht 69.0 in | Wt 196.4 lb

## 2015-06-03 DIAGNOSIS — E038 Other specified hypothyroidism: Secondary | ICD-10-CM

## 2015-06-03 NOTE — Patient Instructions (Addendum)
Cut pill in 1/2 once a week of the 200ug dose

## 2015-06-03 NOTE — Progress Notes (Signed)
Patient ID: Paul Shannon, male   DOB: [birth year], 49 y.o.   MRN: [id number]            Reason for Appointment:  Hypothyroidism,  Follow-up visit    History of Present Illness:   The Hypothyroidism was first diagnosed in ? 2008  At the time of diagnosis the patient was having fatigue but he thinks that his TSH was tested probably because of routine blood work being done At that time his TSH was 20.5. He thinks he was started on relatively lower doses of levothyroxine and his fatigue did improve. Review of his medication history indicates that he has been taking between 175-200 g of levothyroxine  Over the last several years his TSH has been inconsistent especially in the last 2-3 years  Lab Results  Component Value Date   TSH 0.28* 05/29/2015   TSH 52.33* 04/06/2015   TSH 0.16* 02/10/2014   TSH 66.44* 01/30/2013   TSH 112.56 Repeated and verified X2.* 01/28/2013   TSH 0.19* 06/12/2012   TSH 1.09 05/30/2011   TSH 0.61 03/19/2010   TSH 92.90* 02/27/2009   TSH 6.65* 08/25/2008     On his initial consultation he was complaining of feeling significantly tired especially in the afternoons  Also he had some difficulty with constipation, dry skin but no significant weight gain 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           COMPLIANCE with his medication has been excellent with his taking the medication soon after waking up every morning    On his consultation he was noted that since he had lap band procedure about 2 years ago he has been taking calcium and multivitamin  supplements about 20 minutes after taking his levothyroxine Has usually been taking generic medications   He was asked to switch his calcium and multivitamin supplements to lunchtime but he has not taken any so far     With this his thyroid levels are much better and he feels rectal normal with his energy level.     Past Medical History  Diagnosis Date  . Hypertension   .  Unspecified hypothyroidism   . Overweight(278.02)   . Esophageal reflux   . Irritable bowel syndrome   . Unspecified hemorrhoids without mention of complication   . Fatty liver     Dr Earlean Shawl   . Diverticulitis   . Arthritis   . History of kidney stones   . Biliary dyskinesia   . Anxiety state, unspecified yrs ago    His PAST history significant for fatty liver and this was treated because of his obesity with  laparoscopic gastric banding With this procedure he lost a significant amount of weight and was able to keep it off.  Liver function subsequently have been normal  No history of hypertension since his weight loss occurred, previously was taking antihypertensive drugs  No history of diabetes     Past Surgical History  Procedure Laterality Date  . Knee surgery  2011    right knee   . Liver biopsy    . Colonoscopy    . Breath tek h pylori  10/02/2012    Procedure: BREATH TEK H PYLORI;  Surgeon: Shann Medal, MD;  Location: [facility];  Service: General;  Laterality: N/A;  . Tonsillectomy and adenoidectomy  as child  . Cholecystectomy  05/11/2012  . Laparoscopic gastric banding  12/25/2012    Procedure: LAPAROSCOPIC GASTRIC BANDING;  Surgeon: Shann Medal, MD;  Location: [facility];  Service: General;  Laterality: N/A;  Lap Band Placement  . Mesh applied to lap port  12/25/2012    Procedure: MESH APPLIED TO LAP PORT;  Surgeon: Shann Medal, MD;  Location: [facility];  Service: General;;  . Liver biopsy  12/25/2012    Procedure: LIVER BIOPSY;  Surgeon: Shann Medal, MD;  Location: [facility];  Service: General;;    Family History  Problem Relation Age of Onset  . Heart disease Maternal Grandfather   . Diabetes Maternal Grandfather     and Father   . Kidney cancer Father   . Cancer Father     kidney  . Diabetes Father   . Colon cancer Neg Hx   . Esophageal cancer Neg Hx   . Stomach cancer Neg Hx   . Rectal cancer Neg Hx   . Thyroid disease Sister   . Diabetes Mother       Social History:  reports that he has never smoked. He has never used smokeless tobacco. He reports that he does not drink alcohol or use illicit drugs.  Allergies:  Allergies  Allergen Reactions  . Niacin     REACTION: pt states INTOL w/ flushing  . Other     Red dye=rash   . Penicillins     REACTION: childhood - extreme rash  . Red Dye   . Rosuvastatin     REACTION: pt states INTOL      Medication List       This list is accurate as of: 06/03/15  5:06 PM.  Always use your most recent med list.               aspirin 81 MG tablet  Take 81 mg by mouth daily with breakfast.     calcium carbonate 600 MG Tabs tablet  Commonly known as:  OS-CAL  Take 600 mg by mouth daily with breakfast.     diphenhydramine-acetaminophen 25-500 MG Tabs  Commonly known as:  TYLENOL PM  Take 1 tablet by mouth at bedtime as needed.     Melatonin 1 MG Tabs  Take 1 mg by mouth at bedtime.     multivitamin capsule  Take 1 capsule by mouth daily.        Review of Systems  Respiratory: Negative for shortness of breath.   Cardiovascular: Negative for palpitations.  Gastrointestinal: Positive for constipation. Negative for abdominal pain.  Genitourinary: Negative for frequency.  Musculoskeletal: Negative for myalgias and joint pain.  Skin: Negative for itching.  Neurological: Negative for tingling.       No numbness in hands or feet  Endo/Heme/Allergies: Negative for polydipsia.       No heat or cold intolerance. No unusual fatigue  Psychiatric/Behavioral: Negative for depression.      Examination:    BP 142/100 mmHg  Pulse 68  Temp(Src) 97.7 F (36.5 C)  Resp 16  Ht 5\' 9"  (1.753 m)  Wt 196 lb 6.4 oz (89.086 kg)  BMI 28.99 kg/m2  SpO2 97%  NEUROLOGICAL: Reflexes are bilaterally normal appearing at biceps.   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  With stopping these his TSH has improved significantly Also has been taking 200 g instead of 175 recently  Treatment:  Since his TSH is slightly  Below normal will have him take 6-1/2 tablets a week on his 200 g dose in follow-up in 3 months for repeat assessment  He can take his calcium at lunch or dinner  Rafael Quesada 06/03/2015, 5:06 PM

## 2015-08-04 ENCOUNTER — Other Ambulatory Visit: Payer: Self-pay | Admitting: Endocrinology

## 2015-08-04 NOTE — Telephone Encounter (Signed)
Paul Shannon, This is your pt.  Thanks!

## 2015-08-31 ENCOUNTER — Other Ambulatory Visit (INDEPENDENT_AMBULATORY_CARE_PROVIDER_SITE_OTHER): Payer: PRIVATE HEALTH INSURANCE

## 2015-08-31 ENCOUNTER — Encounter: Payer: Self-pay | Admitting: Pulmonary Disease

## 2015-08-31 DIAGNOSIS — E038 Other specified hypothyroidism: Secondary | ICD-10-CM

## 2015-08-31 LAB — TSH: TSH: 0.36 u[IU]/mL (ref 0.35–4.50)

## 2015-08-31 LAB — T4, FREE: FREE T4: 0.97 ng/dL (ref 0.60–1.60)

## 2015-09-01 NOTE — Telephone Encounter (Signed)
SN spoke with patient via telephone; will close e-mail at request of SN.

## 2015-09-01 NOTE — Telephone Encounter (Signed)
Per pt email sent in: Good Evening Dr Lenna Gilford  I would like to know if you would be willing to give me call my sister has been diagnosis with of the cirrhosis liver and at this time they have told her she is past the bridge but are running more test to determine if there's any need to do a biopsy. The thing that I have questions about is they are telling her it come from autoimmune diseases that's she has had for 12 years but all of sudden it just shows up. If you would call me anytime at [phone]. If you are someone reading this email for Dr Lenna Gilford please pass this on to him my family has a history of liver problem that has took my father. Now my sister,bothers and myself are having to deal with the same problem. Sr Lenna Gilford has treated me and my bother  Please advise thanks

## 2015-09-03 ENCOUNTER — Ambulatory Visit (INDEPENDENT_AMBULATORY_CARE_PROVIDER_SITE_OTHER): Payer: PRIVATE HEALTH INSURANCE | Admitting: Endocrinology

## 2015-09-03 ENCOUNTER — Encounter: Payer: Self-pay | Admitting: Endocrinology

## 2015-09-03 VITALS — BP 154/94 | HR 72 | Temp 98.1°F | Resp 16 | Ht 69.0 in | Wt 196.8 lb

## 2015-09-03 DIAGNOSIS — E038 Other specified hypothyroidism: Secondary | ICD-10-CM

## 2015-09-03 NOTE — Patient Instructions (Signed)
Check BP 

## 2015-09-03 NOTE — Progress Notes (Signed)
Patient ID: Paul Shannon, male   DOB: 1966/01/10, 49 y.o.   MRN: [id number]            Reason for Appointment:  Hypothyroidism,  Follow-up visit    History of Present Illness:   The Hypothyroidism was first diagnosed in ? 2008  At the time of diagnosis the patient was having fatigue but he thinks that his TSH was tested probably because of routine blood work being done At that time his TSH was 20.5. He thinks he was started on relatively lower doses of levothyroxine and his fatigue did improve. Review of his medication history indicates that he has been taking between 175-200 g of levothyroxine in the past  Over the last several years his TSH had been inconsistent especially in the last 2-3 years  Lab Results  Component Value Date   TSH 0.36 08/31/2015   TSH 0.28* 05/29/2015   TSH 52.33* 04/06/2015   TSH 0.16* 02/10/2014   TSH 66.44* 01/30/2013   TSH 112.56 Repeated and verified X2.* 01/28/2013   TSH 0.19* 06/12/2012   TSH 1.09 05/30/2011   TSH 0.61 03/19/2010   TSH 92.90* 02/27/2009     On his initial consultation he was complaining of feeling significantly tired especially in the afternoons when his TSH was 52 With switching his calcium to the evening his TSH improved significantly after removing the drug interaction He takes the thyroid supplement regularly in the mornings Has usually been taking generic medications  Since TSH was slightly low he was told to take 6-1/2 tablets of the 200 g in 6/16 His thyroid levels are  better and he feels normal with his energy level recently.     Past Medical History  Diagnosis Date  . Hypertension   . Unspecified hypothyroidism   . Overweight(278.02)   . Esophageal reflux   . Irritable bowel syndrome   . Unspecified hemorrhoids without mention of complication   . Fatty liver     Dr Earlean Shawl   . Diverticulitis   . Arthritis   . History of kidney stones   . Biliary dyskinesia   . Anxiety state, unspecified yrs ago     His PAST history significant for fatty liver and this was treated because of his obesity with  laparoscopic gastric banding With this procedure he lost a significant amount of weight and was able to keep it off.  Liver function subsequently have been normal  No history of hypertension since his weight loss occurred, previously was taking antihypertensive drugs  No history of diabetes     Past Surgical History  Procedure Laterality Date  . Knee surgery  2011    right knee   . Liver biopsy    . Colonoscopy    . Breath tek h pylori  10/02/2012    Procedure: BREATH TEK H PYLORI;  Surgeon: Shann Medal, MD;  Location: [facility];  Service: General;  Laterality: N/A;  . Tonsillectomy and adenoidectomy  as child  . Cholecystectomy  05/11/2012  . Laparoscopic gastric banding  12/25/2012    Procedure: LAPAROSCOPIC GASTRIC BANDING;  Surgeon: Shann Medal, MD;  Location: [facility];  Service: General;  Laterality: N/A;  Lap Band Placement  . Mesh applied to lap port  12/25/2012    Procedure: MESH APPLIED TO LAP PORT;  Surgeon: Shann Medal, MD;  Location: [facility];  Service: General;;  . Liver biopsy  12/25/2012    Procedure: LIVER BIOPSY;  Surgeon: Shann Medal, MD;  Location: [facility];  Service: General;;    Family History  Problem Relation Age of Onset  . Heart disease Maternal Grandfather   . Diabetes Maternal Grandfather     and Father   . Kidney cancer Father   . Cancer Father     kidney  . Diabetes Father   . Colon cancer Neg Hx   . Esophageal cancer Neg Hx   . Stomach cancer Neg Hx   . Rectal cancer Neg Hx   . Thyroid disease Sister   . Diabetes Mother     Social History:  reports that he has never smoked. He has never used smokeless tobacco. He reports that he does not drink alcohol or use illicit drugs.  Allergies:   Allergies  Allergen Reactions  . Niacin     REACTION: pt states INTOL w/ flushing  . Other     Red dye=rash   . Penicillins     REACTION: childhood  - extreme rash  . Red Dye   . Rosuvastatin     REACTION: pt states INTOL      Medication List       This list is accurate as of: 09/03/15  8:16 AM.  Always use your most recent med list.               aspirin 81 MG tablet  Take 81 mg by mouth daily with breakfast.     calcium carbonate 600 MG Tabs tablet  Commonly known as:  OS-CAL  Take 600 mg by mouth daily with breakfast.     diphenhydramine-acetaminophen 25-500 MG Tabs  Commonly known as:  TYLENOL PM  Take 1 tablet by mouth at bedtime as needed.     levothyroxine 200 MCG tablet  Commonly known as:  SYNTHROID, LEVOTHROID  TAKE 1 TABLET BY MOUTH EVERY DAY BEFORE BREAKFAST     Melatonin 1 MG Tabs  Take 1 mg by mouth at bedtime.     multivitamin capsule  Take 1 capsule by mouth daily.        ROS  No recent weight change Wt Readings from Last 3 Encounters:  09/03/15 196 lb 12.8 oz (89.268 kg)  06/03/15 196 lb 6.4 oz (89.086 kg)  04/28/15 195 lb 9.6 oz (88.724 kg)   He previously had hypertension, currently not on treatment and not monitoring at home   Examination:    BP 154/94 mmHg  Pulse 72  Temp(Src) 98.1 F (36.7 C)  Resp 16  Ht 5\' 9"  (1.753 m)  Wt 196 lb 12.8 oz (89.268 kg)  BMI 29.05 kg/m2  SpO2 97%  Repeat blood pressure 160/95  NEUROLOGICAL: Reflexes are bilaterally normal appearing at biceps. 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  With stopping these his TSH has improved significantly  His TSH is finally normal and he is taking his 200 g dose consistently 6-1/2 tablets a week  Treatment:    Since his TSH is normal will have him continue to take 6-1/2 tablets a week on his 200 g dose in follow-up in 6 months for follow-up  He will keep  his calcium at dinner  ?   Hypertension: He was recommended follow-up with his PCP  [facility] 09/03/2015, 8:16 AM   Note: This office note was prepared with Dragon voice recognition system  technology. Any transcriptional errors that result from this process are unintentional.

## 2015-09-14 ENCOUNTER — Telehealth: Payer: Self-pay | Admitting: Pulmonary Disease

## 2015-09-14 MED ORDER — AZITHROMYCIN 250 MG PO TABS: ORAL_TABLET | ORAL | Status: DC

## 2015-09-14 NOTE — Telephone Encounter (Signed)
Spoke with pt, c/o sinus congestion, pnd, nonprod cough, scratchy dry throat, hoarseness X1 week. Pt has taken otc sinus relief and flonase, which has helped some but s/s still present.  Pt requesting zpak.   Pt uses [facility] [address]   SN please advise on recs.  Thanks!

## 2015-09-14 NOTE — Telephone Encounter (Signed)
Per SN, Order Z pak for pt  Called pt and informed of z pak called into pharmacy  Nothing further is needed

## 2015-09-17 ENCOUNTER — Other Ambulatory Visit: Payer: Self-pay | Admitting: Endocrinology

## 2015-09-21 ENCOUNTER — Telehealth: Payer: Self-pay | Admitting: Endocrinology

## 2015-09-21 ENCOUNTER — Other Ambulatory Visit: Payer: Self-pay | Admitting: *Deleted

## 2015-09-21 MED ORDER — LEVOTHYROXINE SODIUM 200 MCG PO TABS: ORAL_TABLET | ORAL | Status: DC

## 2015-09-21 NOTE — Telephone Encounter (Signed)
rx sent

## 2015-09-21 NOTE — Telephone Encounter (Signed)
Patient need refill of synthroid send to  [facility] [id number] - [place], [place] [phone] (Phone) [phone] (Fax)

## 2015-11-27 ENCOUNTER — Telehealth: Payer: Self-pay | Admitting: Pulmonary Disease

## 2015-11-27 MED ORDER — LEVOFLOXACIN 500 MG PO TABS
500.0000 mg | ORAL_TABLET | Freq: Every day | ORAL | Status: DC
Start: 2015-11-27 — End: 2016-02-15

## 2015-11-27 NOTE — Telephone Encounter (Signed)
Patient Returned call  [phone]

## 2015-11-27 NOTE — Telephone Encounter (Signed)
Spoke with pt. He c/o nasal cong, PND, dry throat, blowing clear phlem, HA/pressure, little dry cough. Wants ABX. Please advise SN thanks  Allergies  Allergen Reactions  . Niacin     REACTION: pt states INTOL w/ flushing  . Other     Red dye=rash   . Penicillins     REACTION: childhood - extreme rash  . Red Dye   . Rosuvastatin     REACTION: pt states INTOL     Current Outpatient Prescriptions on File Prior to Visit  Medication Sig Dispense Refill  . aspirin 81 MG tablet Take 81 mg by mouth daily with breakfast.     . azithromycin (ZITHROMAX Z-PAK) 250 MG tablet Use as directed 6 each 0  . calcium carbonate (OS-CAL) 600 MG TABS tablet Take 600 mg by mouth daily with breakfast.    . diphenhydramine-acetaminophen (TYLENOL PM) 25-500 MG TABS Take 1 tablet by mouth at bedtime as needed.    [name] levothyroxine (SYNTHROID, LEVOTHROID) 200 MCG tablet TAKE 1 TABLET BY MOUTH EVERY DAY BEFORE BREAKFAST 30 tablet 5  . Melatonin 1 MG TABS Take 1 mg by mouth at bedtime.    . Multiple Vitamin (MULTIVITAMIN) capsule Take 1 capsule by mouth daily.      No current facility-administered medications on file prior to visit.

## 2015-11-27 NOTE — Telephone Encounter (Signed)
Per SN>> Call in Levaquin 500mg  # 7 with instructions to take 1 PO Q daily until gone. Remind pt to take a OTC probiotic like Align while taking antibiotic.  Left message for pt to call back

## 2015-11-27 NOTE — Telephone Encounter (Signed)
Called spoke with pt. Aware of recs below. RX sent in. Nothing further needed 

## 2015-12-03 ENCOUNTER — Telehealth: Payer: Self-pay | Admitting: Pulmonary Disease

## 2015-12-03 NOTE — Telephone Encounter (Signed)
Patient says he called regarding sinus infection 2 weeks ago and received antibiotics, not helping any.  Patient woke up with a lot of sinus drainage this morning.  Some cough, stuffy head. Patient says that usually he gets a zpak and it helps with his sinus infections, he said he was given Levaquin last time and it doesn't seem to be helping.  Patient says that he is going to have Arthroscopic surgery done on his knee in a few weeks and would like to be well for his surgery.    Pharmacy: [facility] [place]   Current Outpatient Prescriptions on File Prior to Visit  Medication Sig Dispense Refill  . aspirin 81 MG tablet Take 81 mg by mouth daily with breakfast.     . azithromycin (ZITHROMAX Z-PAK) 250 MG tablet Use as directed 6 each 0  . calcium carbonate (OS-CAL) 600 MG TABS tablet Take 600 mg by mouth daily with breakfast.    . diphenhydramine-acetaminophen (TYLENOL PM) 25-500 MG TABS Take 1 tablet by mouth at bedtime as needed.    [name] levofloxacin (LEVAQUIN) 500 MG tablet Take 1 tablet (500 mg total) by mouth daily. 7 tablet 0  . levothyroxine (SYNTHROID, LEVOTHROID) 200 MCG tablet TAKE 1 TABLET BY MOUTH EVERY DAY BEFORE BREAKFAST 30 tablet 5  . Melatonin 1 MG TABS Take 1 mg by mouth at bedtime.    . Multiple Vitamin (MULTIVITAMIN) capsule Take 1 capsule by mouth daily.      No current facility-administered medications on file prior to visit.     Allergies  Allergen Reactions  . Niacin     REACTION: pt states INTOL w/ flushing  . Other     Red dye=rash   . Penicillins     REACTION: childhood - extreme rash  . Red Dye   . Rosuvastatin     REACTION: pt states INTOL

## 2015-12-04 MED ORDER — AZITHROMYCIN 250 MG PO TABS: ORAL_TABLET | ORAL | Status: DC

## 2015-12-04 NOTE — Telephone Encounter (Signed)
SN please advise. Thanks.  

## 2015-12-04 NOTE — Telephone Encounter (Signed)
Pt has not heard anything. Pt is sick and needs something today  [phone]

## 2015-12-04 NOTE — Telephone Encounter (Signed)
Per SN>> Ok to call in [place] for pt and inform him to d/c levaquin  Called and spoke with pt and informed of SN rec Pt voiced understanding Order sent electronically to pharmacy  Nothing further is needed

## 2016-02-10 ENCOUNTER — Telehealth: Payer: Self-pay | Admitting: Pulmonary Disease

## 2016-02-10 NOTE — Telephone Encounter (Signed)
Spoke with pt. States that he has developed a fingernail fungus. Fungus is on his R index and middle fingers. Onset was 3-4 weeks ago. Has been trying OTC remedies with no relief. His index finger no longer has a nail due to this fungus. Would like something to be sent in. SN - please advise. Thanks.  Allergies  Allergen Reactions  . Niacin     REACTION: pt states INTOL w/ flushing  . Other     Red dye=rash   . Penicillins     REACTION: childhood - extreme rash  . Red Dye   . Rosuvastatin     REACTION: pt states INTOL   Current Outpatient Prescriptions on File Prior to Visit  Medication Sig Dispense Refill  . aspirin 81 MG tablet Take 81 mg by mouth daily with breakfast.     . azithromycin (ZITHROMAX Z-PAK) 250 MG tablet Use as directed 6 each 0  . azithromycin (ZITHROMAX Z-PAK) 250 MG tablet Use as directed 6 each 0  . calcium carbonate (OS-CAL) 600 MG TABS tablet Take 600 mg by mouth daily with breakfast.    . diphenhydramine-acetaminophen (TYLENOL PM) 25-500 MG TABS Take 1 tablet by mouth at bedtime as needed.    [name] levofloxacin (LEVAQUIN) 500 MG tablet Take 1 tablet (500 mg total) by mouth daily. 7 tablet 0  . levothyroxine (SYNTHROID, LEVOTHROID) 200 MCG tablet TAKE 1 TABLET BY MOUTH EVERY DAY BEFORE BREAKFAST 30 tablet 5  . Melatonin 1 MG TABS Take 1 mg by mouth at bedtime.    . Multiple Vitamin (MULTIVITAMIN) capsule Take 1 capsule by mouth daily.      No current facility-administered medications on file prior to visit.

## 2016-02-11 MED ORDER — TERBINAFINE HCL 250 MG PO TABS: 250.0000 mg | ORAL_TABLET | Freq: Every day | ORAL | Status: DC

## 2016-02-11 NOTE — Telephone Encounter (Signed)
Per SN:  Call in Lamisil 250mg  #30 Take one tablet daily with 1 refill. Spoke with pt and advised of Dr Jeannine Kitten recommendations.  Rx sent.

## 2016-02-11 NOTE — Telephone Encounter (Signed)
SN please advise. Thanks.  

## 2016-02-15 ENCOUNTER — Telehealth: Payer: Self-pay | Admitting: Pulmonary Disease

## 2016-02-15 MED ORDER — LEVOFLOXACIN 500 MG PO TABS: 500.0000 mg | ORAL_TABLET | Freq: Every day | ORAL | Status: DC

## 2016-02-15 NOTE — Telephone Encounter (Signed)
Patient says that he woke up early this morning with sinus drainage down his throat, coughing.  Wants to know if he can get something called in.  Patient is leaving for [facility] on Thursday morning.   [place]  Allergies  Allergen Reactions  . Niacin     REACTION: pt states INTOL w/ flushing  . Other     Red dye=rash   . Penicillins     REACTION: childhood - extreme rash  . Red Dye   . Rosuvastatin     REACTION: pt states INTOL

## 2016-02-15 NOTE — Telephone Encounter (Signed)
Per SN:  Levaquin 500mg , Take 1 po qd, #7 Take OTC Align once daily

## 2016-02-15 NOTE — Telephone Encounter (Signed)
Called spoke with pt and is aware of recs. RX sent in. Nothing further needed 

## 2016-02-17 ENCOUNTER — Telehealth: Payer: Self-pay | Admitting: Pulmonary Disease

## 2016-02-17 NOTE — Telephone Encounter (Signed)
Called and spoke with the pt. Pt states that since starting the Levaquin he has not seen any improvement and is requesting an ov. Pt c/o sinus drainage and throat discomfort. Denies any cough, chest tightness/congestion, fever, nausea or vomiting.  I scheduled him with TP on 02/19/16 at 9:15am. Pt voiced understanding and had no further questions.

## 2016-02-19 ENCOUNTER — Ambulatory Visit (INDEPENDENT_AMBULATORY_CARE_PROVIDER_SITE_OTHER): Payer: PRIVATE HEALTH INSURANCE | Admitting: Adult Health

## 2016-02-19 ENCOUNTER — Encounter: Payer: Self-pay | Admitting: Adult Health

## 2016-02-19 ENCOUNTER — Ambulatory Visit (INDEPENDENT_AMBULATORY_CARE_PROVIDER_SITE_OTHER)
Admission: RE | Admit: 2016-02-19 | Discharge: 2016-02-19 | Disposition: A | Discharge status: Home / Self Care | Payer: PRIVATE HEALTH INSURANCE | Source: Ambulatory Visit | Attending: Adult Health | Admitting: Adult Health

## 2016-02-19 VITALS — BP 148/80 | HR 67 | Temp 98.4°F | Ht 70.0 in | Wt 187.0 lb

## 2016-02-19 DIAGNOSIS — J209 Acute bronchitis, unspecified: Secondary | ICD-10-CM | POA: Diagnosis not present

## 2016-02-19 DIAGNOSIS — B351 Tinea unguium: Secondary | ICD-10-CM

## 2016-02-19 MED ORDER — PREDNISONE 10 MG PO TABS: ORAL_TABLET | ORAL | Status: DC

## 2016-02-19 NOTE — Assessment & Plan Note (Signed)
Slow to resolve  cxr w/ no acute process   Plan  Finish Levaquin .  Prednisone taper over next week.  Mucinex DM Twice daily  As needed  Cough/congesiton  Follow up with Dr. Lenna Gilford  This month as planned and As needed   Please contact office for sooner follow up if symptoms do not improve or worsen or seek emergency care

## 2016-02-19 NOTE — Patient Instructions (Signed)
Finish Levaquin .  Prednisone taper over next week.  Mucinex DM Twice daily  As needed  Cough/congesiton  Follow up with Dr. Lenna Gilford  This month as planned and As needed   Please contact office for sooner follow up if symptoms do not improve or worsen or seek emergency care

## 2016-02-19 NOTE — Progress Notes (Signed)
Subjective:    Patient ID: Paul Shannon, male    DOB: 04/14/1966, 50 y.o.   MRN: [id number]  HPI 50 yo male never smoker with HTN, GERD , fatty liver disease and dyslipidemia followed by Dr. Lenna Gilford  .  S/p lap band (lost 60lbs)   02/19/2016 Acute OV  Pt presents for an acute office visit. . Complains of . sinus pressure/drainage, chest tightness/congestion, prod cough with green mucus . Called in [place] on 3/6 . Does feel some better but cough is lingering .  Has low energy. Mucus is very thick .  CXR today shows no acute process.  Denies chest pain, orthopnea, edema or fever, hemoptysis.      Past Medical History  Diagnosis Date  . Hypertension   . Unspecified hypothyroidism   . Overweight(278.02)   . Esophageal reflux   . Irritable bowel syndrome   . Unspecified hemorrhoids without mention of complication   . Fatty liver     Dr Earlean Shawl   . Diverticulitis   . Arthritis   . History of kidney stones   . Biliary dyskinesia   . Anxiety state, unspecified yrs ago   Current Outpatient Prescriptions on File Prior to Visit  Medication Sig Dispense Refill  . aspirin 81 MG tablet Take 81 mg by mouth daily with breakfast.     . calcium carbonate (OS-CAL) 600 MG TABS tablet Take 600 mg by mouth daily with breakfast.    . diphenhydramine-acetaminophen (TYLENOL PM) 25-500 MG TABS Take 1 tablet by mouth at bedtime as needed.    [name] levofloxacin (LEVAQUIN) 500 MG tablet Take 1 tablet (500 mg total) by mouth daily. 7 tablet 0  . levothyroxine (SYNTHROID, LEVOTHROID) 200 MCG tablet TAKE 1 TABLET BY MOUTH EVERY DAY BEFORE BREAKFAST 30 tablet 5  . Melatonin 1 MG TABS Take 1 mg by mouth at bedtime.    . Multiple Vitamin (MULTIVITAMIN) capsule Take 1 capsule by mouth daily.     [name] terbinafine (LAMISIL) 250 MG tablet Take 1 tablet (250 mg total) by mouth daily. 30 tablet 1   No current facility-administered medications on file prior to visit.      Review of Systems Constitutional:   No   weight loss, night sweats,  Fevers, chills, fatigue, or  lassitude.  HEENT:   No headaches,  Difficulty swallowing,  Tooth/dental problems, or  Sore throat,                No sneezing, itching, ear ache,  +nasal congestion, post nasal drip,   CV:  No chest pain,  Orthopnea, PND, swelling in lower extremities, anasarca, dizziness, palpitations, syncope.   GI  No heartburn, indigestion, abdominal pain, nausea, vomiting, diarrhea, change in bowel habits, loss of appetite, bloody stools.   Resp:  .  No chest wall deformity  Skin: no rash _+ nail fungus   GU: no dysuria, change in color of urine, no urgency or frequency.  No flank pain, no hematuria   MS:  No joint pain or swelling.  No decreased range of motion.  No back pain.  Psych:  No change in mood or affect. No depression or anxiety.  No memory loss.         Objective:   Physical Exam Filed Vitals:   02/19/16 0926  BP: 148/80  Pulse: 67  Temp: 98.4 F (36.9 C)  TempSrc: Oral  Height: 5\' 10"  (1.778 m)  Weight: 187 lb (84.823 kg)  SpO2: 97%   GEN: A/Ox3;  pleasant , NAD, well nourished   HEENT:  [place]/AT,  EACs-clear, TMs-wnl, NOSE-clear, THROAT-clear, no lesions, no postnasal drip or exudate noted.   NECK:  Supple w/ fair ROM; no JVD; normal carotid impulses w/o bruits; no thyromegaly or nodules palpated; no lymphadenopathy.  RESP  Clear  P & A; w/o, wheezes/ rales/ or rhonchi.no accessory muscle use, no dullness to percussion  CARD:  RRR, no m/r/g  , no peripheral edema, pulses intact, no cyanosis or clubbing.  GI:   Soft & nt; nml bowel sounds; no organomegaly or masses detected.  Musco: Warm bil, no deformities or joint swelling noted.   Neuro: alert, no focal deficits noted.    Skin: Warm, no lesions or rashes +probable nail fungus w/ nail cracking /peeling  CXR 02/19/2016 reviewed independently  NAD   Tammy Parrett NP-C  [facility]  02/19/2016        Assessment & Plan:

## 2016-02-19 NOTE — Assessment & Plan Note (Signed)
Nail fungus , recently started on Lamisil.  Hold for 1 week  follow up later this month with LFT

## 2016-02-26 ENCOUNTER — Other Ambulatory Visit: Payer: PRIVATE HEALTH INSURANCE

## 2016-03-02 ENCOUNTER — Telehealth: Payer: Self-pay | Admitting: Endocrinology

## 2016-03-02 ENCOUNTER — Encounter: Payer: Self-pay | Admitting: *Deleted

## 2016-03-02 ENCOUNTER — Ambulatory Visit: Payer: PRIVATE HEALTH INSURANCE | Admitting: Endocrinology

## 2016-03-02 NOTE — Telephone Encounter (Signed)
Patient no showed today's appt. Please advise on how to follow up. °A. No follow up necessary. °B. Follow up urgent. Contact patient immediately. °C. Follow up necessary. Contact patient and schedule visit in ___ days. °D. Follow up advised. Contact patient and schedule visit in ____weeks. ° °

## 2016-03-02 NOTE — Telephone Encounter (Signed)
Letter mailed

## 2016-03-02 NOTE — Telephone Encounter (Signed)
Reschedule at the earliest possible time

## 2016-03-17 ENCOUNTER — Telehealth: Payer: Self-pay | Admitting: Endocrinology

## 2016-03-17 NOTE — Telephone Encounter (Signed)
Pt does not want to see Dr. Dwyane Dee at this time

## 2016-04-06 ENCOUNTER — Other Ambulatory Visit: Payer: Self-pay | Admitting: Endocrinology

## 2016-04-08 ENCOUNTER — Other Ambulatory Visit (INDEPENDENT_AMBULATORY_CARE_PROVIDER_SITE_OTHER): Payer: PRIVATE HEALTH INSURANCE

## 2016-04-08 DIAGNOSIS — E038 Other specified hypothyroidism: Secondary | ICD-10-CM

## 2016-04-08 LAB — TSH: TSH: 0.15 u[IU]/mL — ABNORMAL LOW (ref 0.35–4.50)

## 2016-04-08 LAB — T4, FREE: Free T4: 1.34 ng/dL (ref 0.60–1.60)

## 2016-04-11 ENCOUNTER — Ambulatory Visit (INDEPENDENT_AMBULATORY_CARE_PROVIDER_SITE_OTHER): Payer: PRIVATE HEALTH INSURANCE | Admitting: Pulmonary Disease

## 2016-04-11 ENCOUNTER — Encounter: Payer: Self-pay | Admitting: Pulmonary Disease

## 2016-04-11 VITALS — BP 142/88 | HR 71 | Temp 98.2°F | Ht 70.0 in | Wt 187.0 lb

## 2016-04-11 DIAGNOSIS — K5730 Diverticulosis of large intestine without perforation or abscess without bleeding: Secondary | ICD-10-CM

## 2016-04-11 DIAGNOSIS — B351 Tinea unguium: Secondary | ICD-10-CM

## 2016-04-11 DIAGNOSIS — E039 Hypothyroidism, unspecified: Secondary | ICD-10-CM

## 2016-04-11 DIAGNOSIS — Z0000 Encounter for general adult medical examination without abnormal findings: Secondary | ICD-10-CM

## 2016-04-11 DIAGNOSIS — K219 Gastro-esophageal reflux disease without esophagitis: Secondary | ICD-10-CM | POA: Diagnosis not present

## 2016-04-11 DIAGNOSIS — K760 Fatty (change of) liver, not elsewhere classified: Secondary | ICD-10-CM | POA: Diagnosis not present

## 2016-04-11 DIAGNOSIS — M159 Polyosteoarthritis, unspecified: Secondary | ICD-10-CM

## 2016-04-11 DIAGNOSIS — M150 Primary generalized (osteo)arthritis: Secondary | ICD-10-CM

## 2016-04-11 DIAGNOSIS — F411 Generalized anxiety disorder: Secondary | ICD-10-CM

## 2016-04-11 MED ORDER — LEVOTHYROXINE SODIUM 200 MCG PO TABS: ORAL_TABLET | ORAL | Status: DC

## 2016-04-11 MED ORDER — ALPRAZOLAM 0.5 MG PO TABS: 0.2500 mg | ORAL_TABLET | Freq: Three times a day (TID) | ORAL | Status: DC | PRN

## 2016-04-11 NOTE — Patient Instructions (Signed)
Today we updated your med list in our EPIC system...    Continue your current medications the same...  Please return to our lab one morning this week for your FASTING blood work...    We will contact you w/ the results when available...   Keep up the good work w/ diet & exercise...  Call for any questions...  Let's plan a follow up visit in 80yr, sooner if needed for problems...  We discussed checking your Thyroid blood test agasin in 6 months...    Please mark your calendar to call us in November 2017 for this lab work.Marland KitchenMarland Kitchen

## 2016-04-12 ENCOUNTER — Encounter: Payer: Self-pay | Admitting: Pulmonary Disease

## 2016-04-12 NOTE — Progress Notes (Signed)
HPI  Review of Systems  Physical Exam  Subjective:    Patient ID: Paul Shannon, male    DOB: 1966-01-09, 50 y.o.   MRN: [id number]  HPI 50 y/o WM here for a follow up visit & CPX... he has multiple medical problems as noted below...  ~  SEE PREV EPIC NOTES FOR OLDER DATA >>    ~  Hospitalized 04/2011 w/ acute diverticulitis, treated w/ Cipro & Flagyl & slowly improved; CT Abd revealed acute diverticulitis in prox sigmoid w/ perforation & pericolonic inflamm, and fatty liver disease; CCS consult by Paul Shannon w/ rec for med rx & no surg intervention...  ~  [id number]:  referred to [facility] for Cholecystectomy & intraoperative liver biopsy to further investigate the status of his liver disease (recall hx of father passing away w/ cirrhosis due to this condition) as his last bx was in the 82's;  He did well w/ surgery & had neg intraop cholangiogram, & an uneventful recovery;  Liver Bx however showed progressive HepSteatosis- 70% steatosis & total NASH score of 8 (out of poss 9);    LABS 5/13 in hosp:  Elev LFTs as noted;  CBC- ok   LABS 7/13:  FLP- ok on diet alone;  Chems- ok x persist incr LFTs due to steatosis;  TSH= 0.19 on Synth200 & we decided to slowly wean... ~  January 21, 2013:  s/p Lap Band surg for his Hepatic Steatosis & pos FamHx cirrhosis from this condition (his insurance wouldn't cover a gastric procedure); he has already lost over 30# down to 202# today & he says "I never would have known how bad I really felt til I could compare to how good I feel now" and he states he is now eating just 10% of what he ate before...  LABS 2/14:  FLP- at goals on diet alone;  Chems- wnl & LFTs now normal;  CBC- wnl;  TSH=112 & repeat=66 on Levothy263mg/d > asked to take it every AM, on empty stomach, don't eat for 1H, etc... ~  February 12, 2014:  Paul Shannon's weight was down to 175# after his LapBand surg, but he gained up to 199# last yr & now at 194# and his LFTs remain wnl... He has a BTeaching laboratory technician(Paul Shannon w/  elevLFTs & steatosis; and another Bro w/ [employer]...    LABS 3/15:  FLP- at goals on diet alone;  Chems- wnl;  CBC- wnl;  TSH=0.16...   ~  April 07, 2015:  124moOV & CPX> Paul Shannon doing well, no new complaints or concerns; his weight is stable, BMI~28, appetite good, etc; he has to chew hard foods like steaks very well to keep them from getting "hung-up" & if this happens he can drink water & then vomit it all back up (occurs every 4-49m91mopending on what he eats)...  He had right foot drop after the lap band surg- [facility]; he reports that MRI was neg but NCV was abn & surg on lateral right leg near fibular head relieved pressure on the nerve & cured the problem... We reviewed the following medical problems during today's office visit >>     HBP> prev on ASA81 (he stopped), & no BP meds; BP= 128/76 & he denies CP, palpit, SOB, edema; we discussed that he might have to restart BP meds if wt increases...    Lipids> on diet alone; wt is down as noted after LapBand surg & stable ~200#; FLP  4/16 shows TChol 146, TG 95, HDL 50, LDL 77    Hypothyroid> on Levothyroid 151mg daily; hx of suspect med compliance in past; clinically & biochem euthyroid but TSH 4/16 = 52, he swears he's taking it Qam on empty stomach & npo for 377m; we will refer to Endocrine to figure this out    Overweight> [facility] & he est that he is eating 10% of what he ate before; wt is down ~40# from his peak...    GI-GERD, Divertics, IBS, Hems, s/p GB> on AnusolHC cream as needed; not requiring PPI rx etc; he denies abd pain, n/v, c/d, blood seen...    Fatty Liver Dis> Paul Shannon mentioned that liver looked some better at the time of his surg; LFTs much improved & now remain WNL after lap band & wt loss...    DJD, right knee discomfort> hx right knee arthroscopy 10/11 by Paul Shannon; stable & he avoids NSAIDs... He had  right foot drop after lap band surg & required NS by Paul Shannon to move nerve near fibular head w/ complete resolution...    Anxiety> prev on Alpraz0.5 prn but he has been off all meds for some time- doing well w/o anxiety symptoms... We reviewed prob list, meds, xrays and labs> see below for updates >>   CXR 4/16 showed norm heart size, clear lungs, NAD...Marland KitchenMarland Kitchen EKG 4/16 showed SBrady, rate56, LAD, IRBBB, otherw NAD...  LABS 4/16:  FLP- at goals on diet alone;  Chems- wnl w/ norm LFTs;  CBC- wnl;  TSH=52 on Levothy175!  PSA=0.95... PLAN>>  He continues to do well s/p lap band wt reduction surg & LFTs have remained wnl;  The volitility in his TFTs are unexplained- prev on Levothy200 his TSH was 0.16, decreased to 17521md and TSH=52!  He states taking it every day 1st thing in the AM & NPO for 55m34mfter dosing- we will refer to Endocrine to sort this out...  ~  Apr 11, 2016:  Yearly ROV & CPX>  Paul Shannon feeling well, no new complaints or concerns; he is maintaining his weight- now at 187# on his diet w/ lap band in place; he denies CP, palpit, dizzy, SOB, edema; he had a URI 02/2016 treated by TP w/ Levaquin, Pred, Mucinex & resolved;  He also reports left knee arthritis w/ fluid tapped Paul Shannon, on Mobic 15mg67m & he continues to follow (we do not have notes from Ortho)... we reviewed the following medical problems during today's office visit >>     HBP> on ASA81; no BP meds; BP= 142/88 & he denies CP, palpit, SOB, edema; we discussed that he might have to restart BP meds if wt increases...    Lipids> on diet alone; wt is down as noted after LapBand surg & stable <200#; FLP [place]17 shows TChol 139, TG 99, HDL 41, LDL 78... rec to continue diet/ exercise...    Hypothyroid> on Levothyroid 200mcg59mking 6.5 tabs/wk); last yr on 175mcg/68ms TSH was 52 & we45eviewed taking med 1st thing in AM etc=> sent him to endocrine & he saw Paul Shannon 08/2015- noted inconsistent TSH readings, he noted pt was taking a  calcium supplement at the same time & switched this to evening, & his TSH stabilized (all<1.00 w/ norm FreeT4)... LAB 03/2016 showed TSH=0.15, FreeT4=1.34...    Overweight> [facility] by [facility] history & he est that  he is eating 10% of what he ate before; wt is down ~40# from his peak & holding <200# w/ normal LFTs...    GI-GERD, Divertics, IBS, Hems, s/p GB> on AnusolHC cream as needed; not requiring PPI rx etc; he denies abd pain, n/v, c/d, blood seen...    Fatty Liver Dis> Paul Shannon mentioned that liver looked some better at the time of his surg; LFTs remain WNL after lap band & wt loss; recall +FamHx Steatosis & father died from cirrhosis, pt's bro refused lap band surg...    DJD, right knee discomfort> hx right knee arthroscopy 10/11 by Paul Shannon; stable & he avoids NSAIDs... He had right foot drop after lap band surg & required NS by Paul Shannon to move nerve near fibular head w/ complete resolution;  He had knee shot by Paul Shannon for joint effusion 2017.    Anxiety> prev on Alpraz0.5 prn but he has been off all meds for some time- doing well w/o anxiety symptoms... 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  LABS 04/14/16>  FLP- at goals on diet alone;  Chems- wnl w/ norm LFTs;  CBC- wnl;  PSA=0.94;  Last TSH was 03/2016 by Paul Shannon- TSH=0.15, FreeT4=1.34 IMP/PLAN>>  Tauheed is stable overall- issues of NASH resolved after lap band surg & signif wt reduction; Thyroid improved after moving his calcium supplement to PM; Paul Shannon manages his arthritis- left knee arthrocentesis and Mobic rx; Labs look good...           Problem List:  HYPERTENSION (ICD-401.9) - controlled on HYZAAR  100-25 daily... BP=116/76 today, & he states OK at home ~130's to 140/ 80's-90... denies HA, fatigue, visual changes, CP, palipit, dizziness, syncope, dyspnea, edema, etc...  ~  CXR 4/11 showed low lung vol's but clear, normal Cor, NAD. ~  EKG showed NSR rate 60, no signif STTWA's, WNL. ~  CXR 5/13 showed normal heart size, clear lungs, NAD... ~  2/14: on ASA81, Hyzaar100-25, K20Bid; BP= 108/70 assoc w/ his wt reduction of 36# total so far; he denies CP, palpit, SOB, edema; we decided to decr the Hyzaar to 1/2 tab daily & cut the K20 to one daily... ~  3/15: on ASA81, & off all BP meds; BP= 136/88 & he denies CP, palpit, SOB, edema; we discussed that he might have to restart BP meds if wt increases... ~  4/16: prev on ASA81 (he stopped), & no BP meds; BP= 128/76 & he denies CP, palpit, SOB, edema; we discussed that he might have to restart BP meds if wt increases...  HYPERCHOLESTEROLEMIA (ICD-272.0) - he has refused meds preferring to attempt control on diet alone... he was intol to Niacin in past w/ flushing... intol to Crestor10 when tried in Sep09... ~  [place] 3/08 showed TChol 143, TG 190, HDL 30, LDL 75... rec to start fibrate- he declined meds. ~  [place] 9/09 showed TChol 143, TG 123, HDL 20, LDL 99... rec to start Crestor21m/d- he agrees. ~  pt states that he didn't tolerate the Crestor10 either, therefore stopped it. ~  FLP 4/11 showed TChol 130, TG 94, HDL 33, LDL 79... much improved, on diet alone. ~  FLP 6/12 on diet alone (after divertic episode) showed TChol 131,  TG 112, HDL 30, LDL 78... rec incr exercise. ~  FLP 7/13 on diet alone showed TChol 123, TG 123, HDL 34, LDL 65 ~  FLP 2/14 on diet alone w/ 36# wt reduction so far showed TChol 135, TG 55, HDL 55, LDL 69 ~  FLP 3/15 on diet alone showed TChol 101, TG 52, HDL 37, LDL 54  HYPOTHYROIDISM (ICD-244.9) - on SYNTHROID 225mg/d w/ ?compliance issues...  ~  labs 3/08 showed TSH=20.5 on Synthroid 1763m/d and dose increased to 200. ~   labs 9/09 showed TSH= 6.65 on 20012md... continue same med. ~  ran out of meds Feb10 and didn't refill- labs 3/10 showed TSH= 93... rec> restart LEVOTHY200. ~  labs 4/11 showed TSH= 0.61... clinically euthyroid, may need to cut back dose. ~  Labs 6/12 showed TSH= 1.09 ~  Labs 7/13 on Synthroid200 showed TSH= 0.19 & we decided to decr the Synthroid to 175m81m... ~  Labs 2/14 on Synthroid200 showed TSH=112, repeat=66; we discussed taking med every day, 1st thing in the AM on empty stomach, don't eat for 1h; I discussed this by phone w/ drEllison & he concurred that med compliace was the most likely explanation... ~  Labs 3/15 on Synthroid200 showed TSH=0.16 and we decided to decr dose to 175mc75m~  Labs 4/16 on Synthroid175 showed TSH=52!  ?what is going on? He assures me that he takes it every day, 1st thing in the AM & npo for 30min77mer dosing... We will refer to ENDOCRINE for their review.  OVERWEIGHT (ICD-278.02) - since we first met Donelle in 1997 his weight has been 220-230 range... occas down to 210-215, and once down to 205# in 2001... highest weight has been 232#... ~  Marland Kitcheneight 3/10 = 230# ~  weight 4/11 = 228# ~  Weight 6/12 = 221#... This was after his diverticulitis episode. ~  Weight 7/13 = 235# ~  Weight 2/14 = 202#... s/p Lap Band surg 1/14. ~  Weight 2/14 = 199# ~  Weight 3/15 = 194# ~  Weight 4/16 = 198#  GERD (ICD-530.81) - uses OTC Prilosec as needed. ~  EGD 3/13 by DrKaplan showed a benign nodule in the distal esoph, otherw neg...  DIVERTICULITIS>  Adm to hosp 5/12 w/ acute sigmoid diverticulitis;  treated w/ Cipro & Flagyl & slowly improved; CT Abd revealed acute diverticulitis in prox sigmoid w/ perforation & pericolonic inflamm, and fatty liver disease; CCS consult by Paul Shannon w/ rec for med rx & no surg intervention; he has a post hosp follow up appt w/ DrKaplan ==> pending 6/12 ~  Colonoscopy 6/12 by DrKaplan showed mod divertics in the sigmoid, otherw neg & f/u  planned 109yrs.8yrRRITABLE BOWEL SYNDROME (ICD-564.1) & HEMORRHOIDS (ICD-455.6) - he had a neg FlexSig in 1998... he uses OTC PrepH.  FATTY LIVER DISEASE (ICD-571.8) -  he has hepatic steatosis w/ [facility]... there is a pos family hx of cirrhosis in his father who died from this disease... he has been counselled on weight reduction and told to avoid hepatotoxins including alcohol...  he has discussed Hepatic Steatosis/ Fatty Liver Disease w/ GI, and we have provided him w/ mult hand-out about this condition...  he understands that it can lead to cirrhosis and death from liver failure... he understands that he must get his weight down, control his fat intake, and follow the FLP & LFT's carefully... ~  LFTs 3/08 showed SGOT= 61, SGPT= 100... rec> get wt  down. ~  LFTs 9/09 showed SGOT= 43, SGPT= 62... improved. ~  LFTs 2/10 showed SGOT= 29, SGPT= 39... normal! ~  LFTs 4/11 showed SGOT= 60, SGPT= 97... must diet & get wt down! 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 ~  LFTs 6/13 showed SGOT= 121, SGPT= 164... Pt referred back to GI for his severe hepatic steatosis... ~  LFTs 1/14 s/p Lap Band surg for his steatohepatitis showed SGOT=47, SGPT=72...  ~  LFTs 2/14 (wt=199#) showed SGOT=38, SGPT=34; much improved... ~  LFTs have remained WNL since his lap-band surg & weight loss to <200#...  Small lesion in the skin on the underside of the scrotum >> presented 2/14 w/ this superfic infection; treated w/ Sitz, Neosporin, SeptraDSBid... ~  2/14: pt reports much better & lesion resolved on Rx...  DJD/ Right Knee arthroscopy 10/11 by Paul Shannon for menicus tears & chondromalacia... Hx right foot drop after lap-band surg w/ eval by Paul Shannon & NS-Paul Shannon w/ neg MRI back but abn NCVs; ultimately had surg on the nerve right  lat knee/fibular head & all his symptoms resolved...  ANXIETY (ICD-300.00) - uses Alprazolam Prn...  DERM - mult tattoos...  Health Maintenance -  ~  GU:  age 50, PSA checked 2/10 due to symptoms and normal at 0.85... ~  GI:  had FlexSig in 1998 for IBS symptoms by [facility] & it was neg... GI f/u pending w/ DrKaplan s/p diverticulitis. ~  Immunizations:  ? last Tetanus shot (he agrees to TDAP today)... hasn't had Pneumovax, Flu shots (refuses), or HepB series...  << NOTE: GIVEN COMBINED HEP A & HEP B VACCINE BY [facility] 7-8/13 >>   Past Surgical History  Procedure Laterality Date  . Knee surgery  2011    right knee   . Liver biopsy    . Colonoscopy    . Breath tek h pylori  10/02/2012    Procedure: BREATH TEK H PYLORI;  Surgeon: Shann Medal, MD;  Location: [facility];  Service: General;  Laterality: N/A;  . Tonsillectomy and adenoidectomy  as child  . Cholecystectomy  05/11/2012  . Laparoscopic gastric banding  12/25/2012    Procedure: LAPAROSCOPIC GASTRIC BANDING;  Surgeon: Shann Medal, MD;  Location: [facility];  Service: General;  Laterality: N/A;  Lap Band Placement  . Mesh applied to lap port  12/25/2012    Procedure: MESH APPLIED TO LAP PORT;  Surgeon: Shann Medal, MD;  Location: [facility];  Service: General;;  . Liver biopsy  12/25/2012    Procedure: LIVER BIOPSY;  Surgeon: Shann Medal, MD;  Location: [facility];  Service: General;;    Outpatient Encounter Prescriptions as of 04/11/2016  Medication Sig  . aspirin 81 MG tablet Take 81 mg by mouth daily with breakfast.   . levothyroxine (SYNTHROID, LEVOTHROID) 200 MCG tablet TAKE 1 TABLET BY MOUTH EVERY DAY BEFORE BREAKFAST  . Melatonin 1 MG TABS Take 1 mg by mouth at bedtime.  . Multiple Vitamin (MULTIVITAMIN) capsule Take 1 capsule by mouth daily.   [name] terbinafine (LAMISIL) 250 MG tablet Take 1 tablet (250 mg total) by mouth daily.  . [DISCONTINUED] levothyroxine (SYNTHROID, LEVOTHROID) 200 MCG tablet TAKE 1 TABLET BY MOUTH  EVERY DAY BEFORE BREAKFAST  . ALPRAZolam (XANAX) 0.5 MG tablet Take 0.5-1 tablets (0.25-0.5 mg total) by mouth 3 (three) times  daily as needed for anxiety.  . calcium carbonate (OS-CAL) 600 MG TABS tablet Take 600 mg by mouth daily with breakfast. Reported on 04/11/2016  . diphenhydramine-acetaminophen (TYLENOL PM) 25-500 MG TABS Take 1 tablet by mouth at bedtime as needed. Reported on 04/11/2016  . [DISCONTINUED] levofloxacin (LEVAQUIN) 500 MG tablet Take 1 tablet (500 mg total) by mouth daily.  . [DISCONTINUED] predniSONE (DELTASONE) 10 MG tablet 4 tabs for 2 days, then 3 tabs for 2 days, 2 tabs for 2 days, then 1 tab for 2 days, then stop   No facility-administered encounter medications on file as of 04/11/2016.    Allergies  Allergen Reactions  . Niacin     REACTION: pt states INTOL w/ flushing  . Other     Red dye=rash   . Penicillins     REACTION: childhood - extreme rash  . Red Dye   . Rosuvastatin     REACTION: pt states INTOL    Current Medications, Allergies, Past Medical History, Past Surgical History, Family History, and Social History were reviewed in [employer] record.   Review of Systems        The patient complains of joint pain and paresthesias.  The patient denies fever, chills, sweats, anorexia, fatigue, weakness, malaise, weight loss, sleep disorder, blurring, diplopia, eye irritation, eye discharge, vision loss, eye pain, photophobia, earache, ear discharge, tinnitus, decreased hearing, nasal congestion, nosebleeds, sore throat, hoarseness, chest pain, palpitations, syncope, dyspnea on exertion, orthopnea, PND, peripheral edema, cough, dyspnea at rest, excessive sputum, hemoptysis, wheezing, pleurisy, nausea, vomiting, diarrhea, constipation, change in bowel habits, abdominal pain, melena, hematochezia, jaundice, gas/bloating, indigestion/heartburn, dysphagia, odynophagia, dysuria, hematuria, urinary frequency, urinary hesitancy, nocturia,  incontinence, back pain, joint swelling, muscle cramps, muscle weakness, stiffness, arthritis, sciatica, restless legs, leg pain at night, leg pain with exertion, rash, itching, dryness, suspicious lesions, paralysis, seizures, tremors, vertigo, transient blindness, frequent falls, frequent headaches, difficulty walking, depression, anxiety, memory loss, confusion, cold intolerance, heat intolerance, polydipsia, polyphagia, polyuria, unusual weight change, abnormal bruising, bleeding, enlarged lymph nodes, urticaria, allergic rash, hay fever, and recurrent infections.     Objective:   Physical Exam      WD, Overweight, 50 y/o WM in NAD... GENERAL:  Alert & oriented; pleasant & cooperative... HEENT:  /AT, EOM-wnl, PERRLA, EACs-clear, TMs-wnl, NOSE-clear, THROAT-clear & wnl... NECK:  Supple w/ full ROM; no JVD; normal carotid impulses w/o bruits; no thyromegaly or nodules palpated; no lymphadenopathy... CHEST:  Clear to P & A; without wheezes/ rales/ or rhonchi heard... HEART:  Regular Rhythm; without murmurs/ rubs/ or gallops detected... ABDOMEN:  Soft & min tender LLQ; normal bowel sounds; no organomegaly or masses palpated...  RECTAL:  neg- prostate 2+ normal, stool heme neg, few ext hem's noted... EXT: without deformities, mild arthritic changes;   NEURO:  CN's intact; motor testing normal; sensory testing normal; gait normal & balance OK. DERM:  No lesions noted; no rash, several tatoos...   Assessment & Plan:    CPX>> doing satis, continue same diet/ exercise/ meds...    HBP>  Controlled off all BP meds on diet alone since he's lost the weight...  CHOL>  Improved w/ wt reduction, advised to keep up the wt reduction efforts & low fat diet...  HYPOTHYROID>  Labs have been up & down on dose of 175-234mg/d, it makes no sense since he claims he's taking it every day 1st thing in the AM, & npo x 1H;  Paul Shannon found that he was taking calcium supplement at  the same time & switching this  to the eve has made all the difference...   OBESITY>  He is now s/p Lap Band surg 1/14 [facility]; wt already down ~40# & he feels much better...  GI>  GERD/ Divertics/ IBS/ Fatty Liver>  Followed by Demetra Shiner for GI...  DIVERTICULITIS>  AS ABOVE- acute diverticulitis 5/12 w/ perf & pericolonic inflamm, improved w/ antibiotic Rx & f/u colon 6/12 was otherw neg...  NAFLD>  This is his most serious medical condition w/ liver bx proving marked progression over the last 10+yrs; his father died from this disease; he had bariatric surg 1/14 & has lost >40#, feeling better & LFTs improved... We will continue to follow...  DJD> s/p right knee arthroscopy by Paul Shannon 10/11...   Patient's Medications  New Prescriptions   ALPRAZOLAM (XANAX) 0.5 MG TABLET    Take 0.5-1 tablets (0.25-0.5 mg total) by mouth 3 (three) times daily as needed for anxiety.  Previous Medications   ASPIRIN 81 MG TABLET    Take 81 mg by mouth daily with breakfast.    CALCIUM CARBONATE (OS-CAL) 600 MG TABS TABLET    Take 600 mg by mouth daily with breakfast. Reported on 04/11/2016   DIPHENHYDRAMINE-ACETAMINOPHEN (TYLENOL PM) 25-500 MG TABS    Take 1 tablet by mouth at bedtime as needed. Reported on 04/11/2016   MELATONIN 1 MG TABS    Take 1 mg by mouth at bedtime.   MULTIPLE VITAMIN (MULTIVITAMIN) CAPSULE    Take 1 capsule by mouth daily.    TERBINAFINE (LAMISIL) 250 MG TABLET    Take 1 tablet (250 mg total) by mouth daily.  Modified Medications   Modified Medication Previous Medication   LEVOTHYROXINE (SYNTHROID, LEVOTHROID) 200 MCG TABLET levothyroxine (SYNTHROID, LEVOTHROID) 200 MCG tablet      TAKE 1 TABLET BY MOUTH EVERY DAY BEFORE BREAKFAST    TAKE 1 TABLET BY MOUTH EVERY DAY BEFORE BREAKFAST  Discontinued Medications   LEVOFLOXACIN (LEVAQUIN) 500 MG TABLET    Take 1 tablet (500 mg total) by mouth daily.   PREDNISONE (DELTASONE) 10 MG TABLET    4 tabs for 2 days, then 3 tabs for 2 days, 2 tabs for 2 days, then 1 tab for 2  days, then stop

## 2016-04-14 ENCOUNTER — Other Ambulatory Visit (INDEPENDENT_AMBULATORY_CARE_PROVIDER_SITE_OTHER): Payer: PRIVATE HEALTH INSURANCE

## 2016-04-14 DIAGNOSIS — Z0000 Encounter for general adult medical examination without abnormal findings: Secondary | ICD-10-CM | POA: Diagnosis not present

## 2016-04-14 DIAGNOSIS — K5730 Diverticulosis of large intestine without perforation or abscess without bleeding: Secondary | ICD-10-CM

## 2016-04-14 LAB — COMPREHENSIVE METABOLIC PANEL
ALK PHOS: 73 U/L (ref 39–117)
ALT: 41 U/L (ref 0–53)
AST: 30 U/L (ref 0–37)
Albumin: 4.6 g/dL (ref 3.5–5.2)
BUN: 14 mg/dL (ref 6–23)
CO2: 30 mEq/L (ref 19–32)
Calcium: 9.8 mg/dL (ref 8.4–10.5)
Chloride: 103 mEq/L (ref 96–112)
Creatinine, Ser: 0.86 mg/dL (ref 0.40–1.50)
GFR: 100.37 mL/min (ref >60.00)
GLUCOSE: 94 mg/dL (ref 70–99)
POTASSIUM: 4.4 meq/L (ref 3.5–5.1)
Sodium: 140 mEq/L (ref 135–145)
TOTAL PROTEIN: 7.4 g/dL (ref 6.0–8.3)
Total Bilirubin: 1.4 mg/dL — ABNORMAL HIGH (ref 0.2–1.2)

## 2016-04-14 LAB — LIPID PANEL
CHOLESTEROL: 139 mg/dL (ref 0–200)
HDL: 41.3 mg/dL (ref >39.00)
LDL Cholesterol: 78 mg/dL (ref 0–99)
NonHDL: 97.3
Total CHOL/HDL Ratio: 3
Triglycerides: 99 mg/dL (ref 0.0–149.0)
VLDL: 19.8 mg/dL (ref 0.0–40.0)

## 2016-04-14 LAB — CBC WITH DIFFERENTIAL/PLATELET
BASOS PCT: 0.4 % (ref 0.0–3.0)
Basophils Absolute: 0 10*3/uL (ref 0.0–0.1)
EOS PCT: 5 % (ref 0.0–5.0)
Eosinophils Absolute: 0.3 10*3/uL (ref 0.0–0.7)
HCT: 46.1 % (ref 39.0–52.0)
Hemoglobin: 16.2 g/dL (ref 13.0–17.0)
LYMPHS ABS: 2.1 10*3/uL (ref 0.7–4.0)
Lymphocytes Relative: 34.2 % (ref 12.0–46.0)
MCHC: 35.1 g/dL (ref 30.0–36.0)
MCV: 92.2 fl (ref 78.0–100.0)
MONO ABS: 0.7 10*3/uL (ref 0.1–1.0)
Monocytes Relative: 12.2 % — ABNORMAL HIGH (ref 3.0–12.0)
NEUTROS PCT: 48.2 % (ref 43.0–77.0)
Neutro Abs: 2.9 10*3/uL (ref 1.4–7.7)
Platelets: 270 10*3/uL (ref 150.0–400.0)
RBC: 5 Mil/uL (ref 4.22–5.81)
RDW: 13.2 % (ref 11.5–15.5)
WBC: 6.1 10*3/uL (ref 4.0–10.5)

## 2016-04-14 LAB — PSA: PSA: 0.94 ng/mL (ref 0.10–4.00)

## 2016-04-15 NOTE — Progress Notes (Signed)
Quick Note:  Called and spoke to pt. Informed him of the results and recs per SN. Pt verbalized understanding and denied any further questions or concerns at this time.   ______ 

## 2016-07-11 ENCOUNTER — Other Ambulatory Visit: Payer: Self-pay | Admitting: Pulmonary Disease

## 2016-07-12 MED ORDER — ALPRAZOLAM 0.5 MG PO TABS: 0.2500 mg | ORAL_TABLET | Freq: Three times a day (TID) | ORAL | 0 refills | Status: DC | PRN

## 2016-09-14 ENCOUNTER — Other Ambulatory Visit: Payer: Self-pay | Admitting: Pulmonary Disease

## 2016-09-16 ENCOUNTER — Encounter (HOSPITAL_COMMUNITY): Payer: Self-pay

## 2016-09-26 ENCOUNTER — Telehealth: Payer: Self-pay | Admitting: Pulmonary Disease

## 2016-09-26 MED ORDER — METRONIDAZOLE 500 MG PO TABS: 500.0000 mg | ORAL_TABLET | Freq: Three times a day (TID) | ORAL | 0 refills | Status: DC

## 2016-09-26 MED ORDER — CIPROFLOXACIN HCL 500 MG PO TABS: 500.0000 mg | ORAL_TABLET | Freq: Two times a day (BID) | ORAL | 0 refills | Status: DC

## 2016-09-26 NOTE — Telephone Encounter (Signed)
Per SN---  If they are sure that this is what it is  Offer OV with SN--OR-- Call in cipro 500 mg #20 1 po TID until gone Flagyl 500 mg  #30  1 po tid until gone.     Called and spoke with pts wife and she stated that they are sure that this is what it is and wanted the meds called to the pharmacy.  She stated that if he gets worse she will take him to the ER and if he is not better by tomorrow afternoon she will call for an appt.  meds have been sent in and pt is aware.

## 2016-09-26 NOTE — Telephone Encounter (Signed)
Patients wife states that patient is having a flare up of his Diverticulitis.  Has fever (100.3), stomach cramps all through the night and all day today, feels tired and weak.  Felt this way when he had his last flare up.  Used to keep medication on hand, but has not had a flare up in 4 years, so whatever they gave him to hold, he no longer has it.  Wants something called in to help him.   [place]  Allergies  Allergen Reactions  . Niacin     REACTION: pt states INTOL w/ flushing  . Other     Red dye=rash   . Penicillins     REACTION: childhood - extreme rash  . Red Dye   . Rosuvastatin     REACTION: pt states INTOL   Current Outpatient Prescriptions on File Prior to Visit  Medication Sig Dispense Refill  . ALPRAZolam (XANAX) 0.5 MG tablet Take 0.5-1 tablets (0.25-0.5 mg total) by mouth 3 (three) times daily as needed for anxiety. 90 tablet 0  . aspirin 81 MG tablet Take 81 mg by mouth daily with breakfast.     . calcium carbonate (OS-CAL) 600 MG TABS tablet Take 600 mg by mouth daily with breakfast. Reported on 04/11/2016    . diphenhydramine-acetaminophen (TYLENOL PM) 25-500 MG TABS Take 1 tablet by mouth at bedtime as needed. Reported on 04/11/2016    . levothyroxine (SYNTHROID, LEVOTHROID) 200 MCG tablet TAKE 1 TABLET BY MOUTH EVERY DAY BEFORE BREAKFAST 90 tablet 0  . Melatonin 1 MG TABS Take 1 mg by mouth at bedtime.    . Multiple Vitamin (MULTIVITAMIN) capsule Take 1 capsule by mouth daily.     [name] terbinafine (LAMISIL) 250 MG tablet Take 1 tablet (250 mg total) by mouth daily. 30 tablet 1   No current facility-administered medications on file prior to visit.

## 2016-10-12 ENCOUNTER — Encounter: Payer: Self-pay | Admitting: Pulmonary Disease

## 2016-11-11 ENCOUNTER — Telehealth: Payer: Self-pay | Admitting: Pulmonary Disease

## 2016-11-11 MED ORDER — LEVOFLOXACIN 500 MG PO TABS: 500.0000 mg | ORAL_TABLET | Freq: Every day | ORAL | 0 refills | Status: DC

## 2016-11-11 NOTE — Telephone Encounter (Signed)
Per SN---   Call in levaquin 500 mg  #7  1 daily Align once daily mucinex 1200 mg BID Fluids Saline nasal mist  Called and spoke with pt and he is aware of SN recs. meds have been sent in .

## 2016-11-11 NOTE — Telephone Encounter (Signed)
Unless Dr Lenna Gilford is willing to call something in for him today then he will need ov/ workin for me or NPs

## 2016-11-11 NOTE — Telephone Encounter (Signed)
SN  Please Advise- Sick Message  This a pt.  C/o coughing, with sinus drainage and sinus pressure with clear to brownish mucus. Denies fever, chills, sob. He wanted to see if something could be called in for him.

## 2016-11-11 NOTE — Telephone Encounter (Signed)
MW  Please Advise- Sick Message  This a pt. Of SN. C/o coughing, with sinus drainage and sinus pressure with clear to brownish mucus. Denies fever, chills, sob. He wanted to see if something could be called in for him.

## 2017-03-03 ENCOUNTER — Other Ambulatory Visit: Payer: Self-pay | Admitting: Pulmonary Disease

## 2017-03-03 NOTE — Telephone Encounter (Signed)
Patient is requesting refill on Alprazolam 0.5mg - Last refill 07/12/16 #90 with 0 refills- take 0.5-1 tab tid prn anxiety Last office visit: 04/11/2016 Next office visit: none scheduled  SN please advise on refill.  Thanks!

## 2017-03-06 NOTE — Telephone Encounter (Signed)
SN please advise on refill.  Thanks. 

## 2017-03-07 MED ORDER — ALPRAZOLAM 0.5 MG PO TABS: 0.2500 mg | ORAL_TABLET | Freq: Three times a day (TID) | ORAL | 5 refills | Status: DC | PRN

## 2017-03-07 MED ORDER — LEVOTHYROXINE SODIUM 200 MCG PO TABS: 200.0000 ug | ORAL_TABLET | Freq: Every day | ORAL | 1 refills | Status: DC

## 2017-04-06 ENCOUNTER — Other Ambulatory Visit: Payer: Self-pay | Admitting: Pulmonary Disease

## 2017-04-06 ENCOUNTER — Other Ambulatory Visit (INDEPENDENT_AMBULATORY_CARE_PROVIDER_SITE_OTHER): Payer: PRIVATE HEALTH INSURANCE

## 2017-04-06 DIAGNOSIS — E7800 Pure hypercholesterolemia, unspecified: Secondary | ICD-10-CM

## 2017-04-06 DIAGNOSIS — Z0000 Encounter for general adult medical examination without abnormal findings: Secondary | ICD-10-CM

## 2017-04-06 DIAGNOSIS — E039 Hypothyroidism, unspecified: Secondary | ICD-10-CM

## 2017-04-06 DIAGNOSIS — I10 Essential (primary) hypertension: Secondary | ICD-10-CM

## 2017-04-06 LAB — COMPREHENSIVE METABOLIC PANEL
ALBUMIN: 4.4 g/dL (ref 3.5–5.2)
ALT: 29 U/L (ref 0–53)
AST: 25 U/L (ref 0–37)
Alkaline Phosphatase: 66 U/L (ref 39–117)
BUN: 8 mg/dL (ref 6–23)
CALCIUM: 9.4 mg/dL (ref 8.4–10.5)
CHLORIDE: 102 meq/L (ref 96–112)
CO2: 33 meq/L — AB (ref 19–32)
CREATININE: 0.88 mg/dL (ref 0.40–1.50)
GFR: 97.35 mL/min (ref >60.00)
Glucose, Bld: 91 mg/dL (ref 70–99)
Potassium: 3.6 mEq/L (ref 3.5–5.1)
Sodium: 140 mEq/L (ref 135–145)
Total Bilirubin: 1.9 mg/dL — ABNORMAL HIGH (ref 0.2–1.2)
Total Protein: 7.1 g/dL (ref 6.0–8.3)

## 2017-04-06 LAB — LIPID PANEL
CHOL/HDL RATIO: 2
CHOLESTEROL: 95 mg/dL (ref 0–200)
HDL: 41.7 mg/dL (ref >39.00)
LDL CALC: 39 mg/dL (ref 0–99)
NonHDL: 53.61
TRIGLYCERIDES: 73 mg/dL (ref 0.0–149.0)
VLDL: 14.6 mg/dL (ref 0.0–40.0)

## 2017-04-06 LAB — TSH: TSH: 0.05 u[IU]/mL — ABNORMAL LOW (ref 0.35–4.50)

## 2017-04-06 LAB — CBC WITH DIFFERENTIAL/PLATELET
BASOS ABS: 0 10*3/uL (ref 0.0–0.1)
BASOS PCT: 0.4 % (ref 0.0–3.0)
EOS ABS: 0.2 10*3/uL (ref 0.0–0.7)
Eosinophils Relative: 3.9 % (ref 0.0–5.0)
HEMATOCRIT: 45.7 % (ref 39.0–52.0)
HEMOGLOBIN: 16.4 g/dL (ref 13.0–17.0)
LYMPHS PCT: 35.4 % (ref 12.0–46.0)
Lymphs Abs: 1.7 10*3/uL (ref 0.7–4.0)
MCHC: 35.8 g/dL (ref 30.0–36.0)
MCV: 89.2 fl (ref 78.0–100.0)
Monocytes Absolute: 0.6 10*3/uL (ref 0.1–1.0)
Monocytes Relative: 11.4 % (ref 3.0–12.0)
NEUTROS ABS: 2.4 10*3/uL (ref 1.4–7.7)
Neutrophils Relative %: 48.9 % (ref 43.0–77.0)
Platelets: 241 10*3/uL (ref 150.0–400.0)
RBC: 5.12 Mil/uL (ref 4.22–5.81)
RDW: 13.1 % (ref 11.5–15.5)
WBC: 4.9 10*3/uL (ref 4.0–10.5)

## 2017-04-06 LAB — URINALYSIS
Bilirubin Urine: NEGATIVE
Hgb urine dipstick: NEGATIVE
Ketones, ur: NEGATIVE
LEUKOCYTES UA: NEGATIVE
Nitrite: NEGATIVE
PH: 6 (ref 5.0–8.0)
UROBILINOGEN UA: 0.2 (ref 0.0–1.0)
Urine Glucose: NEGATIVE

## 2017-04-06 LAB — PSA: PSA: 1.41 ng/mL (ref 0.10–4.00)

## 2017-04-11 ENCOUNTER — Encounter: Payer: Self-pay | Admitting: Pulmonary Disease

## 2017-04-11 ENCOUNTER — Ambulatory Visit (INDEPENDENT_AMBULATORY_CARE_PROVIDER_SITE_OTHER): Payer: PRIVATE HEALTH INSURANCE | Admitting: Pulmonary Disease

## 2017-04-11 VITALS — BP 122/70 | HR 77 | Temp 97.5°F | Ht 70.0 in | Wt 185.1 lb

## 2017-04-11 DIAGNOSIS — B029 Zoster without complications: Secondary | ICD-10-CM | POA: Insufficient documentation

## 2017-04-11 DIAGNOSIS — M159 Polyosteoarthritis, unspecified: Secondary | ICD-10-CM

## 2017-04-11 DIAGNOSIS — Z0000 Encounter for general adult medical examination without abnormal findings: Secondary | ICD-10-CM

## 2017-04-11 DIAGNOSIS — M150 Primary generalized (osteo)arthritis: Secondary | ICD-10-CM

## 2017-04-11 MED ORDER — FAMCICLOVIR 500 MG PO TABS: 500.0000 mg | ORAL_TABLET | Freq: Three times a day (TID) | ORAL | 1 refills | Status: DC

## 2017-04-11 MED ORDER — METHYLPREDNISOLONE 4 MG PO TABS: ORAL_TABLET | ORAL | 0 refills | Status: DC

## 2017-04-11 NOTE — Progress Notes (Signed)
HPI  Review of Systems  Physical Exam  Subjective:    Patient ID: Paul Shannon, male    DOB: 13-May-1966, 51 y.o.   MRN: [id number]  HPI 51 y/o WM here for a follow up visit & CPX... he has multiple medical problems as noted below...  ~  SEE PREV EPIC NOTES FOR OLDER DATA >>    ~  Hospitalized 04/2011 w/ acute diverticulitis, treated w/ Cipro & Flagyl & slowly improved; CT Abd revealed acute diverticulitis in prox sigmoid w/ perforation & pericolonic inflamm, and fatty liver disease; CCS consult by DrCornett w/ rec for med rx & no surg intervention...  ~  [id number]:  referred to [facility] for Cholecystectomy & intraoperative liver biopsy to further investigate the status of his liver disease (recall hx of father passing away w/ cirrhosis due to this condition) as his last bx was in the 83's;  He did well w/ surgery & had neg intraop cholangiogram, & an uneventful recovery;  Liver Bx however showed progressive HepSteatosis- 70% steatosis & total NASH score of 8 (out of poss 9);    LABS 5/13 in hosp:  Elev LFTs as noted;  CBC- ok   LABS 7/13:  FLP- ok on diet alone;  Chems- ok x persist incr LFTs due to steatosis;  TSH= 0.19 on Synth200 & we decided to slowly wean... ~  January 21, 2013:  s/p Lap Band surg for his Hepatic Steatosis & pos FamHx cirrhosis from this condition (his insurance wouldn't cover a gastric procedure); he has already lost over 30# down to 202# today & he says "I never would have known how bad I really felt til I could compare to how good I feel now" and he states he is now eating just 10% of what he ate before...  LABS 2/14:  FLP- at goals on diet alone;  Chems- wnl & LFTs now normal;  CBC- wnl;  TSH=112 & repeat=66 on Levothy269mg/d > asked to take it every AM, on empty stomach, don't eat for 1H, etc... ~  February 12, 2014:  Holland's weight was down to 175# after his LapBand surg, but he gained up to 199# last yr & now at 194# and his LFTs remain wnl... He has a BTeaching laboratory technician(Ronalee Belts w/  elevLFTs & steatosis; and another Bro w/ [place]...    LABS 3/15:  FLP- at goals on diet alone;  Chems- wnl;  CBC- wnl;  TSH=0.16...   ~  April 07, 2015:  1454moOV & CPX> FrNoelleports doing well, no new complaints or concerns; his weight is stable, BMI~28, appetite good, etc; he has to chew hard foods like steaks very well to keep them from getting "hung-up" & if this happens he can drink water & then vomit it all back up (occurs every 4-54m95mopending on what he eats)...  He had right foot drop after the lap band surg- [facility]; he reports that MRI was neg but NCV was abn & surg on lateral right leg near fibular head relieved pressure on the nerve & cured the problem... We reviewed the following medical problems during today's office visit >>     HBP> prev on ASA81 (he stopped), & no BP meds; BP= 128/76 & he denies CP, palpit, SOB, edema; we discussed that he might have to restart BP meds if wt increases...    Lipids> on diet alone; wt is down as noted after LapBand surg & stable ~200#; FLP  4/16 shows TChol 146, TG 95, HDL 50, LDL 77    Hypothyroid> on Levothyroid 151mg daily; hx of suspect med compliance in past; clinically & biochem euthyroid but TSH 4/16 = 52, he swears he's taking it Qam on empty stomach & npo for 377m; we will refer to Endocrine to figure this out    Overweight> [facility] & he est that he is eating 10% of what he ate before; wt is down ~40# from his peak...    GI-GERD, Divertics, IBS, Hems, s/p GB> on AnusolHC cream as needed; not requiring PPI rx etc; he denies abd pain, n/v, c/d, blood seen...    Fatty Liver Dis> DrNewman mentioned that liver looked some better at the time of his surg; LFTs much improved & now remain WNL after lap band & wt loss...    DJD, right knee discomfort> hx right knee arthroscopy 10/11 by DrWainer; stable & he avoids NSAIDs... He had  right foot drop after lap band surg & required NS by DrDJones to move nerve near fibular head w/ complete resolution...    Anxiety> prev on Alpraz0.5 prn but he has been off all meds for some time- doing well w/o anxiety symptoms... We reviewed prob list, meds, xrays and labs> see below for updates >>   CXR 4/16 showed norm heart size, clear lungs, NAD...Marland KitchenMarland Kitchen EKG 4/16 showed SBrady, rate56, LAD, IRBBB, otherw NAD...  LABS 4/16:  FLP- at goals on diet alone;  Chems- wnl w/ norm LFTs;  CBC- wnl;  TSH=52 on Levothy175!  PSA=0.95... PLAN>>  He continues to do well s/p lap band wt reduction surg & LFTs have remained wnl;  The volitility in his TFTs are unexplained- prev on Levothy200 his TSH was 0.16, decreased to 17521md and TSH=52!  He states taking it every day 1st thing in the AM & NPO for 55m34mfter dosing- we will refer to Endocrine to sort this out...  ~  Apr 11, 2016:  Yearly ROV & CPX>  FredZacariasurns feeling well, no new complaints or concerns; he is maintaining his weight- now at 187# on his diet w/ lap band in place; he denies CP, palpit, dizzy, SOB, edema; he had a URI 02/2016 treated by TP w/ Levaquin, Pred, Mucinex & resolved;  He also reports left knee arthritis w/ fluid tapped DrWainer, on Mobic 15mg67m & he continues to follow (we do not have notes from Ortho)... we reviewed the following medical problems during today's office visit >>     HBP> on ASA81; no BP meds; BP= 142/88 & he denies CP, palpit, SOB, edema; we discussed that he might have to restart BP meds if wt increases...    Lipids> on diet alone; wt is down as noted after LapBand surg & stable <200#; FLP 5[place]17 shows TChol 139, TG 99, HDL 41, LDL 78... rec to continue diet/ exercise...    Hypothyroid> on Levothyroid 200mcg59mking 6.5 tabs/wk); last yr on 175mcg/68ms TSH was 52 & we45eviewed taking med 1st thing in AM etc=> sent him to endocrine & he saw DrKumar 08/2015- noted inconsistent TSH readings, he noted pt was taking a  calcium supplement at the same time & switched this to evening, & his TSH stabilized (all<1.00 w/ norm FreeT4)... LAB 03/2016 showed TSH=0.15, FreeT4=1.34...    Overweight> he [facility] by his history & he est that  he is eating 10% of what he ate before; wt is down ~40# from his peak & holding <200# w/ normal LFTs...    GI-GERD, Divertics, IBS, Hems, s/p GB> on AnusolHC cream as needed; not requiring PPI rx etc; he denies abd pain, n/v, c/d, blood seen...    Fatty Liver Dis> DrNewman mentioned that liver looked some better at the time of his surg; LFTs remain WNL after lap band & wt loss; recall +FamHx Steatosis & father died from cirrhosis, pt's bro refused lap band surg...    DJD, right knee discomfort> hx right knee arthroscopy 10/11 by DrWainer; stable & he avoids NSAIDs... He had right foot drop after lap band surg & required NS by DrDJones to move nerve near fibular head w/ complete resolution;  He had knee shot by DrWainer for joint effusion 2017.    Anxiety> prev on Alpraz0.5 prn but he has been off all meds for some time- doing well w/o anxiety symptoms... 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  LABS 04/14/16>  FLP- at goals on diet alone;  Chems- wnl w/ norm LFTs;  CBC- wnl;  PSA=0.94;  Last TSH was 03/2016 by DrKumar- TSH=0.15, FreeT4=1.34 IMP/PLAN>>  Weston is stable overall- issues of NASH resolved after lap band surg & signif wt reduction; Thyroid improved after moving his calcium supplement to PM; DrWainer manages his arthritis- left knee arthrocentesis and Mobic rx; Labs look good...    ~  Apr 11, 2017:  33yrROV & medical follow up visit>  FTagenreports a good yr  overall-- he's had 2 issues to discuss:  1) arthritis left knee, s/o another arthroscopy in 2017 by drWainer (no notes avail to review), he is rec to use a brace w/ physical activity 7 use advil vs Aleve for pain & swelling, he will stay in touch w/ Ortho... 2) new prob- small lesion outbreak on right wrist area laterally, small blister- ?shingles eruption? & asked to watch for other lesions in C6-7 ditribution, we will Rx w/ Famvir500Tis x7d & Medrol Dosepak... We reviewed the following medical problems during today's office visit >>     Borderline HBP> on ASA81; no BP meds; BP= 122/70 after wt loss & he denies CP, palpit, SOB, edema; rec to avoid sodium 7 keep wt down!    Lipids> on diet alone (the way he likes it); wt is down as noted after LapBand surg & stable 185# today; FLP 04/2017 shows TChol 95, TG 73, HDL 42, LDL 39... WOW, fabulous, continue diet rx...    Hypothyroid> on Levothyroid 2072m (taking 6.5 tabs/wk); in 2016 on 17525md his TSH was 52 & we reviewed taking med 1st thing in AM etc=> sent him to endocrine & he saw DrKumar 08/2015- noted inconsistent TSH readings, he noted pt was taking a calcium supplement at the same time & switched this to evening, & his TSH stabilized (all<1.00 w/ norm FreeT4)... LAB 03/2016 showed TSH=0.15, FreeT4=1.34; LAB 4/18 showed TSH=0.05 & needs to decr.    Overweight> he [facility] by his history; wt is down ~40# from his peak & holding <200# w/ normal LFTs...    GI-GERD, Divertics, IBS,  Hems, s/p GB> on AnusolHC cream as needed; not requiring PPI rx etc; he denies abd pain, n/v, c/d, blood seen...    Fatty Liver Dis> DrNewman mentioned that liver looked some better at the time of his surg; LFTs remain WNL after lap band & wt loss; recall +FamHx Steatosis & father died from cirrhosis, pt's bro refused lap band surg...    DJD, bilat knee pain> hx right knee arthroscopy 2011 & left knee arthroscopy 2017 by DrWainer;  stable & he avoids NSAIDs... He had right foot drop after lap band surg & required NS by DrDJones to move nerve near fibular head w/ complete resolution;  We discussed knee brace, Advil vs aleve prn & stay in contact w/ Ortho...    Anxiety> prev on Alpraz0.5 prn but he has been off all meds for some time- doing well w/o anxiety symptoms... 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  LABS 04/08/17>  FLP- WOW all parameters are WNL;  Chems- wnl w/ K=3.6, BS=91, Cr=0.88, LFTs wnl;  CBC- wnl w/ Hg=16.4;  TSH=0.05 on Synthroid200 & he is decr to 150;  PSA=1.41... IMP/PLAN>>  Rondall is stable overall, great job w/ wt loss & resolved NAFLD- has prob shingles right writ (C6-7) & we will Rx w/ Famvir, Medrol;  TSH is oversuppressed now that he is taking it properly & no interference w/ absorption- rec to decr to 136mg/d w/ f/u labs;  His CC is his knee arthritis, managed by drWainer, Ortho- s/p bilat arthroscopic procedures and steroid injections, pt will try brace, use OTC NSAIDs as needed & stay in touch w/ Orthop...           Problem List:  HYPERTENSION (ICD-401.9) - controlled on HYZAAR 100-25 daily... BP=116/76 today, & he states OK at home ~130's to 140/ 80's-90... denies HA, fatigue, visual changes, CP, palipit, dizziness, syncope, dyspnea, edema, etc...  ~  CXR 4/11 showed low lung vol's but clear, normal Cor, NAD. ~  EKG showed NSR rate 60, no signif STTWA's, WNL. ~  CXR 5/13 showed normal heart size, clear lungs, NAD... ~  2/14: on ASA81, Hyzaar100-25, K20Bid; BP= 108/70 assoc w/ his wt reduction of 36# total so far; he denies CP, palpit, SOB, edema; we decided to decr the Hyzaar to 1/2 tab daily & cut the K20 to one  daily... ~  3/15: on ASA81, & off all BP meds; BP= 136/88 & he denies CP, palpit, SOB, edema; we discussed that he might have to restart BP meds if wt increases... ~  4/16: prev on ASA81 (he stopped), & no BP meds; BP= 128/76 & he denies CP, palpit, SOB, edema; we discussed that he might have to restart BP meds if wt increases...  HYPERCHOLESTEROLEMIA (ICD-272.0) - he has refused meds preferring to attempt control on diet alone... he was intol to Niacin in past w/ flushing... intol to Crestor10 when tried in Sep09... ~  FSullivan3/08 showed TChol 143, TG 190, HDL 30, LDL 75... rec to start fibrate- he declined meds. ~  FHunting[place]9/09 showed TChol 143, TG 123, HDL 20, LDL 99... rec to start Crestor180md- he agrees. ~  pt states that he didn't tolerate the Crestor10 either, therefore stopped  it. ~  FLP 4/11 showed TChol 130, TG 94, HDL 33, LDL 79... much improved, on diet alone. ~  FLP 6/12 on diet alone (after divertic episode) showed TChol 131, TG 112, HDL 30, LDL 78... rec incr exercise. ~  FLP 7/13 on diet alone showed TChol 123, TG 123, HDL 34, LDL 65 ~  FLP 2/14 on diet alone w/ 36# wt reduction so far showed TChol 135, TG 55, HDL 55, LDL 69 ~  FLP 3/15 on diet alone showed TChol 101, TG 52, HDL 37, LDL 54  HYPOTHYROIDISM (ICD-244.9) - on SYNTHROID 228mg/d w/ ?compliance issues...  ~  labs 3/08 showed TSH=20.5 on Synthroid 1756m/d and dose increased to 200. ~  labs 9/09 showed TSH= 6.65 on 20035md... continue same med. ~  ran out of meds Feb10 and didn't refill- labs 3/10 showed TSH= 93... rec> restart LEVOTHY200. ~  labs 4/11 showed TSH= 0.61... clinically euthyroid, may need to cut back dose. ~  Labs 6/12 showed TSH= 1.09 ~  Labs 7/13 on Synthroid200 showed TSH= 0.19 & we decided to decr the Synthroid to 175m98m... ~  Labs 2/14 on Synthroid200 showed TSH=112, repeat=66; we discussed taking med every day, 1st thing in the AM on empty stomach, don't eat for 1h; I discussed this by phone w/  drEllison & he concurred that med compliace was the most likely explanation... ~  Labs 3/15 on Synthroid200 showed TSH=0.16 and we decided to decr dose to 175mc36m~  Labs 4/16 on Synthroid175 showed TSH=52!  ?what is going on? He assures me that he takes it every day, 1st thing in the AM & npo for 30min68mer dosing... We will refer to ENDOCRINE for their review.  OVERWEIGHT (ICD-278.02) - since we first met Moise in 1997 his weight has been 220-230 range... occas down to 210-215, and once down to 205# in 2001... highest weight has been 232#... ~  Marland Kitcheneight 3/10 = 230# ~  weight 4/11 = 228# ~  Weight 6/12 = 221#... This was after his diverticulitis episode. ~  Weight 7/13 = 235# ~  Weight 2/14 = 202#... s/p Lap Band surg 1/14. ~  Weight 2/14 = 199# ~  Weight 3/15 = 194# ~  Weight 4/16 = 198#  GERD (ICD-530.81) - uses OTC Prilosec as needed. ~  EGD 3/13 by DrKaplan showed a benign nodule in the distal esoph, otherw neg...  DIVERTICULITIS>  Adm to hosp 5/12 w/ acute sigmoid diverticulitis;  treated w/ Cipro & Flagyl & slowly improved; CT Abd revealed acute diverticulitis in prox sigmoid w/ perforation & pericolonic inflamm, and fatty liver disease; CCS consult by DrCornett w/ rec for med rx & no surg intervention; he has a post hosp follow up appt w/ DrKaplan ==> pending 6/12 ~  Colonoscopy 6/12 by DrKaplan showed mod divertics in the sigmoid, otherw neg & f/u planned 96yrs.42yrRRITABLE BOWEL SYNDROME (ICD-564.1) & HEMORRHOIDS (ICD-455.6) - he had a neg FlexSig in 1998... he uses OTC PrepH.  FATTY LIVER DISEASE (ICD-571.8) -  he has hepatic steatosis w/ prev [facility] a liver Bx... there is a pos family hx of cirrhosis in his father who died from this disease... he has been counselled on weight reduction and told to avoid hepatotoxins including alcohol...  he has discussed Hepatic Steatosis/ Fatty Liver Disease w/ GI, and we have provided him w/ mult hand-out about this  condition...  he understands that it can lead to cirrhosis and death from liver failure...Marland KitchenMarland Kitchen  he understands that he must get his weight down, control his fat intake, and follow the FLP & LFT's carefully... ~  LFTs 3/08 showed SGOT= 61, SGPT= 100... rec> get wt down. ~  LFTs 9/09 showed SGOT= 43, SGPT= 62... improved. ~  LFTs 2/10 showed SGOT= 29, SGPT= 39... normal! ~  LFTs 4/11 showed SGOT= 60, SGPT= 97... must diet & get wt down! 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 ~  LFTs 6/13 showed SGOT= 121, SGPT= 164... Pt referred back to GI for his severe hepatic steatosis... ~  LFTs 1/14 s/p Lap Band surg for his steatohepatitis showed SGOT=47, SGPT=72...  ~  LFTs 2/14 (wt=199#) showed SGOT=38, SGPT=34; much improved... ~  LFTs have remained WNL since his lap-band surg & weight loss to <200#...  Small lesion in the skin on the underside of the scrotum >> presented 2/14 w/ this superfic infection; treated w/ Sitz, Neosporin, SeptraDSBid... ~  2/14: pt reports much better & lesion resolved on Rx...  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 Hx right foot drop after lap-band surg w/ eval by DrWainer & NS-DrDJones w/ neg MRI back but abn NCVs; ultimately had surg on the nerve right lat knee/fibular head & all his symptoms resolved...  ANXIETY (ICD-300.00) - uses Alprazolam Prn...  DERM - mult tattoos...  Health Maintenance -  ~  GU:  age 89, PSA checked 2/10 due to symptoms and normal at 0.85... ~  GI:  had FlexSig in 1998 for IBS symptoms by [facility] & it was neg... GI f/u pending w/ DrKaplan s/p diverticulitis. ~  Immunizations:  ? last Tetanus shot (he agrees to TDAP today)... hasn't had Pneumovax, Flu shots (refuses), or HepB series...  << NOTE: GIVEN COMBINED HEP A & HEP B VACCINE BY [facility] 7-8/13  >>   Past Surgical History:  Procedure Laterality Date  . BREATH TEK H PYLORI  10/02/2012   Procedure: [place];  Surgeon: Shann Medal, MD;  Location: [facility];  Service: General;  Laterality: N/A;  . CHOLECYSTECTOMY  05/11/2012  . COLONOSCOPY    . KNEE SURGERY  2011   right knee   . LAPAROSCOPIC GASTRIC BANDING  12/25/2012   Procedure: LAPAROSCOPIC GASTRIC BANDING;  Surgeon: Shann Medal, MD;  Location: [facility];  Service: General;  Laterality: N/A;  Lap Band Placement  . LIVER BIOPSY    . LIVER BIOPSY  12/25/2012   Procedure: LIVER BIOPSY;  Surgeon: Shann Medal, MD;  Location: [facility];  Service: General;;  . MESH APPLIED TO LAP PORT  12/25/2012   Procedure: MESH APPLIED TO LAP PORT;  Surgeon: Shann Medal, MD;  Location: [facility];  Service: General;;  . TONSILLECTOMY AND ADENOIDECTOMY  as child    Outpatient Encounter Prescriptions as of 04/11/2017  Medication Sig  . ALPRAZolam (XANAX) 0.5 MG tablet Take 0.5-1 tablets (0.25-0.5 mg total) by mouth 3 (three) times daily as needed for anxiety.  [name] aspirin 81 MG tablet Take 81 mg by mouth daily with breakfast.   . levothyroxine (SYNTHROID, LEVOTHROID) 200 MCG tablet Take 1 tablet (200 mcg total) by mouth daily before breakfast.  . famciclovir (FAMVIR) 500 MG tablet Take 1 tablet (500 mg total) by mouth 3 (three) times daily.  . methylPREDNISolone (MEDROL) 4 MG tablet Take  as directed  . [DISCONTINUED] calcium carbonate (OS-CAL) 600 MG TABS tablet Take 600 mg by mouth daily with breakfast. Reported on 04/11/2016  . [DISCONTINUED] ciprofloxacin (CIPRO) 500 MG tablet Take 1 tablet (500 mg total) by mouth 2 (two) times daily. (Patient not taking: Reported on 04/11/2017)  . [DISCONTINUED] diphenhydramine-acetaminophen (TYLENOL PM) 25-500 MG TABS Take 1 tablet by mouth at bedtime as needed. Reported on 04/11/2016  . [DISCONTINUED] levofloxacin (LEVAQUIN) 500 MG tablet Take 1 tablet (500 mg total) by mouth daily. (Patient not taking:  Reported on 04/11/2017)  . [DISCONTINUED] Melatonin 1 MG TABS Take 1 mg by mouth at bedtime.  . [DISCONTINUED] metroNIDAZOLE (FLAGYL) 500 MG tablet Take 1 tablet (500 mg total) by mouth 3 (three) times daily. (Patient not taking: Reported on 04/11/2017)  . [DISCONTINUED] Multiple Vitamin (MULTIVITAMIN) capsule Take 1 capsule by mouth daily.   . [DISCONTINUED] terbinafine (LAMISIL) 250 MG tablet Take 1 tablet (250 mg total) by mouth daily. (Patient not taking: Reported on 04/11/2017)   No facility-administered encounter medications on file as of 04/11/2017.     Allergies  Allergen Reactions  . Niacin     REACTION: pt states INTOL w/ flushing  . Other     Red dye=rash   . Penicillins     REACTION: childhood - extreme rash  . Red Dye   . Rosuvastatin     REACTION: pt states INTOL    Current Medications, Allergies, Past Medical History, Past Surgical History, Family History, and Social History were reviewed in [employer] record.   Review of Systems        The patient complains of joint pain and paresthesias.  The patient denies fever, chills, sweats, anorexia, fatigue, weakness, malaise, weight loss, sleep disorder, blurring, diplopia, eye irritation, eye discharge, vision loss, eye pain, photophobia, earache, ear discharge, tinnitus, decreased hearing, nasal congestion, nosebleeds, sore throat, hoarseness, chest pain, palpitations, syncope, dyspnea on exertion, orthopnea, PND, peripheral edema, cough, dyspnea at rest, excessive sputum, hemoptysis, wheezing, pleurisy, nausea, vomiting, diarrhea, constipation, change in bowel habits, abdominal pain, melena, hematochezia, jaundice, gas/bloating, indigestion/heartburn, dysphagia, odynophagia, dysuria, hematuria, urinary frequency, urinary hesitancy, nocturia, incontinence, back pain, joint swelling, muscle cramps, muscle weakness, stiffness, arthritis, sciatica, restless legs, leg pain at night, leg pain with exertion, rash,  itching, dryness, suspicious lesions, paralysis, seizures, tremors, vertigo, transient blindness, frequent falls, frequent headaches, difficulty walking, depression, anxiety, memory loss, confusion, cold intolerance, heat intolerance, polydipsia, polyphagia, polyuria, unusual weight change, abnormal bruising, bleeding, enlarged lymph nodes, urticaria, allergic rash, hay fever, and recurrent infections.     Objective:   Physical Exam      WD, Overweight, 51 y/o WM in NAD... GENERAL:  Alert & oriented; pleasant & cooperative... HEENT:  [place]/AT, EOM-wnl, PERRLA, EACs-clear, TMs-wnl, NOSE-clear, THROAT-clear & wnl... NECK:  Supple w/ full ROM; no JVD; normal carotid impulses w/o bruits; no thyromegaly or nodules palpated; no lymphadenopathy... CHEST:  Clear to P & A; without wheezes/ rales/ or rhonchi heard... HEART:  Regular Rhythm; without murmurs/ rubs/ or gallops detected... ABDOMEN:  Soft & min tender LLQ; normal bowel sounds; no organomegaly or masses palpated...  RECTAL:  neg- prostate 2+ normal, stool heme neg, few ext hem's noted... EXT: without deformities, mild arthritic changes;   NEURO:  CN's intact; motor testing normal; sensory testing normal; gait normal & balance OK. DERM:  No lesions noted; no rash, several tatoos...   Assessment & Plan:    CPX>> doing satis, continue same diet/ exercise/ meds.Marland KitchenMarland Kitchen  HBP>  Controlled off all BP meds on diet alone since he's lost the weight...  CHOL>  Improved w/ wt reduction, advised to keep up the wt reduction efforts & low fat diet...  HYPOTHYROID>  Labs have been up & down on dose of 175-227mg/d, it makes no sense since he claims he's taking it every day 1st thing in the AM, & npo x 1H;  DrKumar found that he was taking calcium supplement at the same time & switching this to the eve has made all the difference...     04/11/17>  TSH=0.05 now & we will decr dose to 1550m/d w/ f/u labs...  OBESITY>  He is now s/p [facility]14 [facility] ~40# & he feels much better...  GI>  GERD/ Divertics/ IBS/ Fatty Liver>  Followed by DrDemetra Shineror GI...  DIVERTICULITIS>  AS ABOVE- acute diverticulitis 5/12 w/ perf & pericolonic inflamm, improved w/ antibiotic Rx & f/u colon 6/12 was otherw neg...  NAFLD>  This is his most serious medical condition w/ liver bx proving marked progression over the last 10+yrs; his father died from this disease; he had bariatric surg 1/14 & has lost >40#, feeling better & LFTs improved... We will continue to follow...  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   Patient's Medications  New Prescriptions   FAMCICLOVIR (FAMVIR) 500 MG TABLET    Take 1 tablet (500 mg total) by mouth 3 (three) times daily.   METHYLPREDNISOLONE (MEDROL) 4 MG TABLET    Take as directed  Previous Medications   ALPRAZOLAM (XANAX) 0.5 MG TABLET    Take 0.5-1 tablets (0.25-0.5 mg total) by mouth 3 (three) times daily as needed for anxiety.   ASPIRIN 81 MG TABLET    Take 81 mg by mouth daily with breakfast.    LEVOTHYROXINE (SYNTHROID, LEVOTHROID) 200 MCG TABLET    Take 1 tablet (200 mcg total) by mouth daily before breakfast.  Modified Medications   No medications on file  Discontinued Medications   CALCIUM CARBONATE (OS-CAL) 600 MG TABS TABLET    Take 600 mg by mouth daily with breakfast. Reported on 04/11/2016   CIPROFLOXACIN (CIPRO) 500 MG TABLET    Take 1 tablet (500 mg total) by mouth 2 (two) times daily.   DIPHENHYDRAMINE-ACETAMINOPHEN (TYLENOL PM) 25-500 MG TABS    Take 1 tablet by mouth at bedtime as needed. Reported on 04/11/2016   LEVOFLOXACIN (LEVAQUIN) 500 MG TABLET    Take 1 tablet (500 mg total) by mouth daily.   MELATONIN 1 MG TABS    Take 1 mg by mouth at bedtime.   METRONIDAZOLE (FLAGYL) 500 MG TABLET    Take 1 tablet (500 mg total) by mouth 3 (three) times daily.   MULTIPLE VITAMIN (MULTIVITAMIN) CAPSULE    Take 1 capsule by mouth daily.    TERBINAFINE (LAMISIL) 250 MG  TABLET    Take 1 tablet (250 mg total) by mouth daily.

## 2017-04-11 NOTE — Patient Instructions (Signed)
Today we updated your med list in our EPIC system...    Continue your current medications the same...  For the ?shingles rash >> watch for additional outbreaks on your right arm & let me knw...    Take the FAMCYCLOVIR 500mg  tabs- one tab 3 times daily til gone...    Take the MEDROL dosepak as directed on the pack...  For the knee arthritis >> try a knee brace for any strenuous activity...    Try the OTC ADVIL vs ALEVE as needed...    Call if this isn't working for a stonger Rx...  We reviewed your blood work & gave you a copy for youur records...    Absolutely fabulous job w/ your diet!!!  Call for any questions...  Let's plan a follow up visit in 68yr, sooner if needed for problems.Marland KitchenMarland Kitchen

## 2017-05-22 ENCOUNTER — Encounter: Payer: Self-pay | Admitting: Pulmonary Disease

## 2017-05-22 MED ORDER — LEVOTHYROXINE SODIUM 150 MCG PO CAPS: 150.0000 ug | ORAL_CAPSULE | Freq: Every day | ORAL | 3 refills | Status: DC

## 2017-05-22 NOTE — Telephone Encounter (Signed)
Dr Lenna Gilford, please advise if you had any plan of decreasing the patient's Levothyroxine down from 235mcg. Pt states that she thought she remembered you mentioning that but I do not see anything noted on his last OV 04/11/17. Please advise Dr Lenna Gilford. Thanks.

## 2017-07-19 ENCOUNTER — Encounter (HOSPITAL_COMMUNITY): Payer: Self-pay

## 2017-08-02 ENCOUNTER — Encounter: Payer: Self-pay | Admitting: Pulmonary Disease

## 2017-08-02 ENCOUNTER — Other Ambulatory Visit: Payer: Self-pay | Admitting: Pulmonary Disease

## 2017-08-16 ENCOUNTER — Other Ambulatory Visit: Payer: Self-pay | Admitting: Pulmonary Disease

## 2017-08-16 ENCOUNTER — Encounter: Payer: Self-pay | Admitting: *Deleted

## 2017-08-16 MED ORDER — ALPRAZOLAM 0.5 MG PO TABS: ORAL_TABLET | ORAL | 5 refills | Status: DC

## 2017-11-24 ENCOUNTER — Telehealth: Payer: Self-pay | Admitting: Pulmonary Disease

## 2017-11-24 MED ORDER — HYDROCOD POLST-CPM POLST ER 10-8 MG/5ML PO SUER: 5.0000 mL | Freq: Two times a day (BID) | ORAL | 0 refills | Status: DC | PRN

## 2017-11-24 NOTE — Telephone Encounter (Signed)
SN would like for the patient to have Tussionex for his cough. 5ML every 12 hours as needed.   RX has been printed and signed. Patient is aware that RX is up front for him to pickup.   Nothing else needed at time of call.

## 2017-11-24 NOTE — Telephone Encounter (Signed)
Spoke with patient. He stated that he has had a cough for the past week. The cough only occurs at night when he is lying down flat. It is a non-productive cough. Denies any fever, SOB or body aches. He is unable to sleep at night due to the strong coughing.   He wants to know if Dr. Lenna Gilford would recommend anything for the cough.   He wishes to use [facility] in [place] on [address] [place].   [place], please advise. Thanks!

## 2017-12-22 ENCOUNTER — Telehealth: Payer: Self-pay | Admitting: Pulmonary Disease

## 2017-12-22 MED ORDER — AZITHROMYCIN 250 MG PO TABS: ORAL_TABLET | ORAL | 0 refills | Status: DC

## 2017-12-22 NOTE — Telephone Encounter (Signed)
Per SN: zpak  Spoke with pt, aware of recs. rx sent to preferred pharmacy.  Nothing further needed.

## 2017-12-22 NOTE — Telephone Encounter (Signed)
Pt c/o sinus congestion, sinus pressure, PND, mostly nonprod cough worse qhs.  Pt was given hycodan X1 month ago which helped with s/s, but since finishing rx s/s have returned.    Denies chest pain, fever, mucus production, body aches, chills.   Pt has been taking delsym to help with s/s, requesting additional recs.    Pt uses [facility] on [address] in [place]   SN please advise.  Thanks!

## 2018-01-03 ENCOUNTER — Telehealth: Payer: Self-pay | Admitting: Pulmonary Disease

## 2018-01-03 MED ORDER — PREDNISONE 20 MG PO TABS: ORAL_TABLET | ORAL | 0 refills | Status: DC

## 2018-01-03 NOTE — Telephone Encounter (Signed)
Called and spoke with pt. Pt was prescribed zpak and Tussionex on 12/22/17. Symptoms did improve with Zpak but did not completely subside. Pt states he woke up this morning with a continuous cough- cough is non prod. Pt also reports of nasal drainage clear in color.  Denies fever, chills, sweats or body ache.    SN please advise. Thanks.   Current Outpatient Medications on File Prior to Visit  Medication Sig Dispense Refill  . ALPRAZolam (XANAX) 0.5 MG tablet Take 1/2 to 1 tablet by mouth three times daily as needed 90 tablet 5  . aspirin 81 MG tablet Take 81 mg by mouth daily with breakfast.     . azithromycin (ZITHROMAX) 250 MG tablet Take 2 today, then 1 daily until gone. 6 tablet 0  . chlorpheniramine-HYDROcodone (TUSSIONEX PENNKINETIC ER) 10-8 MG/5ML SUER Take 5 mLs by mouth every 12 (twelve) hours as needed for cough. 120 mL 0  . famciclovir (FAMVIR) 500 MG tablet Take 1 tablet (500 mg total) by mouth 3 (three) times daily. 15 tablet 1  . Levothyroxine Sodium 150 MCG CAPS Take 1 capsule (150 mcg total) by mouth daily before breakfast. 90 capsule 3  . methylPREDNISolone (MEDROL) 4 MG tablet Take as directed 21 tablet 0   No current facility-administered medications on file prior to visit.     Allergies  Allergen Reactions  . Niacin     REACTION: pt states INTOL w/ flushing  . Other     Red dye=rash   . Penicillins     REACTION: childhood - extreme rash  . Red Dye   . Rosuvastatin     REACTION: pt states INTOL

## 2018-01-03 NOTE — Telephone Encounter (Signed)
Per SN: Coughing is because of the inflammation We have ordered prednisone 20mg  #15    Take one tablet BID x3 days, then one tablet daily for 3 days, then 1/2 tablet daily until gone.   Pt informed and all questions answered.

## 2018-01-24 ENCOUNTER — Telehealth: Payer: Self-pay | Admitting: Pulmonary Disease

## 2018-01-24 NOTE — Telephone Encounter (Signed)
Pt is returning call. Cb is [phone].

## 2018-01-24 NOTE — Telephone Encounter (Signed)
Per SN offered patient appointment with him next week first available. Patient did not want to wait that long. Placed pt with TP 2.14.19

## 2018-01-24 NOTE — Telephone Encounter (Signed)
Called and spoke with patient, he states that he has had this cough for 6 weeks. Has tried prednisone, zpak, and cough medicine. Cough subsided some with the prednisone but came back. Patient states that it is a deep dry cough that mostly happens at night and some during the day. Wanting to know what more he can do. SN please advise. Thanks.   Current Outpatient Medications on File Prior to Visit  Medication Sig Dispense Refill  . ALPRAZolam (XANAX) 0.5 MG tablet Take 1/2 to 1 tablet by mouth three times daily as needed 90 tablet 5  . aspirin 81 MG tablet Take 81 mg by mouth daily with breakfast.     . azithromycin (ZITHROMAX) 250 MG tablet Take 2 today, then 1 daily until gone. 6 tablet 0  . chlorpheniramine-HYDROcodone (TUSSIONEX PENNKINETIC ER) 10-8 MG/5ML SUER Take 5 mLs by mouth every 12 (twelve) hours as needed for cough. 120 mL 0  . famciclovir (FAMVIR) 500 MG tablet Take 1 tablet (500 mg total) by mouth 3 (three) times daily. 15 tablet 1  . Levothyroxine Sodium 150 MCG CAPS Take 1 capsule (150 mcg total) by mouth daily before breakfast. 90 capsule 3  . methylPREDNISolone (MEDROL) 4 MG tablet Take as directed 21 tablet 0  . predniSONE (DELTASONE) 20 MG tablet Take one tablet BID for 3 days, then one tablet daily for 3 days, then 1/2 tablet daily until gone. 15 tablet 0   No current facility-administered medications on file prior to visit.    Allergies  Allergen Reactions  . Niacin     REACTION: pt states INTOL w/ flushing  . Other     Red dye=rash   . Penicillins     REACTION: childhood - extreme rash  . Red Dye   . Rosuvastatin     REACTION: pt states INTOL

## 2018-01-24 NOTE — Telephone Encounter (Signed)
LMTCB

## 2018-01-25 ENCOUNTER — Ambulatory Visit (INDEPENDENT_AMBULATORY_CARE_PROVIDER_SITE_OTHER)
Admission: RE | Admit: 2018-01-25 | Discharge: 2018-01-25 | Disposition: A | Discharge status: Home / Self Care | Payer: PRIVATE HEALTH INSURANCE | Source: Ambulatory Visit | Attending: Adult Health | Admitting: Adult Health

## 2018-01-25 ENCOUNTER — Ambulatory Visit (INDEPENDENT_AMBULATORY_CARE_PROVIDER_SITE_OTHER): Payer: PRIVATE HEALTH INSURANCE | Admitting: Adult Health

## 2018-01-25 ENCOUNTER — Telehealth: Payer: Self-pay | Admitting: Pulmonary Disease

## 2018-01-25 ENCOUNTER — Other Ambulatory Visit (INDEPENDENT_AMBULATORY_CARE_PROVIDER_SITE_OTHER): Payer: PRIVATE HEALTH INSURANCE

## 2018-01-25 ENCOUNTER — Encounter: Payer: Self-pay | Admitting: Adult Health

## 2018-01-25 VITALS — BP 140/88 | HR 65 | Ht 69.0 in | Wt 175.2 lb

## 2018-01-25 DIAGNOSIS — R059 Cough, unspecified: Secondary | ICD-10-CM

## 2018-01-25 DIAGNOSIS — J209 Acute bronchitis, unspecified: Secondary | ICD-10-CM | POA: Diagnosis not present

## 2018-01-25 DIAGNOSIS — K219 Gastro-esophageal reflux disease without esophagitis: Secondary | ICD-10-CM

## 2018-01-25 DIAGNOSIS — R05 Cough: Secondary | ICD-10-CM

## 2018-01-25 LAB — CBC WITH DIFFERENTIAL/PLATELET
BASOS PCT: 0.5 % (ref 0.0–3.0)
Basophils Absolute: 0 10*3/uL (ref 0.0–0.1)
EOS PCT: 2.6 % (ref 0.0–5.0)
Eosinophils Absolute: 0.2 10*3/uL (ref 0.0–0.7)
HEMATOCRIT: 45.2 % (ref 39.0–52.0)
HEMOGLOBIN: 16 g/dL (ref 13.0–17.0)
LYMPHS PCT: 31 % (ref 12.0–46.0)
Lymphs Abs: 2.8 10*3/uL (ref 0.7–4.0)
MCHC: 35.4 g/dL (ref 30.0–36.0)
MCV: 90.5 fl (ref 78.0–100.0)
Monocytes Absolute: 0.9 10*3/uL (ref 0.1–1.0)
Monocytes Relative: 10.2 % (ref 3.0–12.0)
Neutro Abs: 5.1 10*3/uL (ref 1.4–7.7)
Neutrophils Relative %: 55.7 % (ref 43.0–77.0)
Platelets: 265 10*3/uL (ref 150.0–400.0)
RBC: 4.99 Mil/uL (ref 4.22–5.81)
RDW: 14.5 % (ref 11.5–15.5)
WBC: 9.1 10*3/uL (ref 4.0–10.5)

## 2018-01-25 LAB — BRAIN NATRIURETIC PEPTIDE: Pro B Natriuretic peptide (BNP): 17 pg/mL (ref 0.0–100.0)

## 2018-01-25 MED ORDER — ALPRAZOLAM 0.5 MG PO TABS: ORAL_TABLET | ORAL | 5 refills | Status: DC

## 2018-01-25 MED ORDER — PREDNISONE 10 MG PO TABS: ORAL_TABLET | ORAL | 0 refills | Status: DC

## 2018-01-25 MED ORDER — BENZONATATE 200 MG PO CAPS: 200.0000 mg | ORAL_CAPSULE | Freq: Three times a day (TID) | ORAL | 1 refills | Status: DC | PRN

## 2018-01-25 NOTE — Assessment & Plan Note (Signed)
tx for possible gerd that could be aggravating her cough   Plan Patient Instructions  Prednisone taper over next .  Mucinex Twice daily  As needed  Cough/congestion  Delsym 2 tsp Twice daily  As needed  Cough .  Zyrtec 10mg . At bedtime  For 1 week .  Tessalon Three times a day  As needed  Cough  Begin Zantac 150mg  Twice daily  For 2 weeks.  Chest xray and labs  Follow up Dr. Lenna Gilford  4 weeks and .As needed   Please contact office for sooner follow up if symptoms do not improve or worsen or seek emergency care

## 2018-01-25 NOTE — Assessment & Plan Note (Signed)
Slow to resolve bronchitis  Hold on abx for now  Check cxr and labs   Plan  . Patient Instructions  Prednisone taper over next .  Mucinex Twice daily  As needed  Cough/congestion  Delsym 2 tsp Twice daily  As needed  Cough .  Zyrtec 10mg . At bedtime  For 1 week .  Tessalon Three times a day  As needed  Cough  Begin Zantac 150mg  Twice daily  For 2 weeks.  Chest xray and labs  Follow up Dr. Lenna Gilford  4 weeks and .As needed   Please contact office for sooner follow up if symptoms do not improve or worsen or seek emergency care

## 2018-01-25 NOTE — Telephone Encounter (Signed)
Patient seen today by TP for acute visit for cough Pt requesting refill on his Alprazolam 0.5mg    1/2-1 TID prn Last refill was 9.5.18 with 5 additional refills  Last ov 5.1.18 for yearly physical  Refills sent to verified pharmacy Rx given to pharmacist [facility] Patient is aware Med list update

## 2018-01-25 NOTE — Patient Instructions (Addendum)
Prednisone taper over next .  Mucinex Twice daily  As needed  Cough/congestion  Delsym 2 tsp Twice daily  As needed  Cough .  Zyrtec 10mg . At bedtime  For 1 week .  Tessalon Three times a day  As needed  Cough  Begin Zantac 150mg  Twice daily  For 2 weeks.  Chest xray and labs  Follow up Dr. Lenna Gilford  4 weeks and .As needed   Please contact office for sooner follow up if symptoms do not improve or worsen or seek emergency care

## 2018-01-25 NOTE — Progress Notes (Signed)
@Patient  ID: Paul Shannon, male    DOB: 25-Jan-1966, 52 y.o.   MRN: [id number]  Chief Complaint  Patient presents with  . Acute Visit    Cough     Referring provider: Noralee Space, MD  HPI: 52 yo male never smoker with HTN, GERD , fatty liver disease and dyslipidemia followed by Dr. Lenna Gilford   S/p lap band 2016 (lost 70lbs)   01/25/2018 Acute OV : Cough  Patient presents for an acute office visit.  Patient complains that over the last week he has had increased cough that is keeping him awake at night.  Patient says about 2 months ago he developed an acute bronchitic symptoms with severe cough body aches.  He took several over-the-counter medicines.  And seemed like symptoms were getting better then they slowly started coming back the beginning of last month.  Patient called in and was given a Z-Pak.  Symptoms had minimum improvement.  He was also called in a cough syrup Tussionex to use at bedtime.  This helps him with sleep.  But he continued to have symptoms. Prednisone taper 3 weeks ago.  Patient says symptoms did get better however over the last week cough seems to be coming back.  It is keeping him up at night.  He denies any fever discolored mucus chest pain orthopnea PND or leg swelling.    Allergies  Allergen Reactions  . Niacin     REACTION: pt states INTOL w/ flushing  . Other     Red dye=rash   . Penicillins     REACTION: childhood - extreme rash  . Red Dye   . Rosuvastatin     REACTION: pt states INTOL    Immunization History  Administered Date(s) Administered  . Hep A / Hep B 06/26/2012, 07/03/2012, 07/27/2012, 07/31/2013  . Td 03/19/2010    Past Medical History:  Diagnosis Date  . Anxiety state, unspecified yrs ago  . Arthritis   . Biliary dyskinesia   . Diverticulitis   . Esophageal reflux   . Fatty liver    Dr Earlean Shawl   . History of kidney stones   . Hypertension   . Irritable bowel syndrome   . Overweight(278.02)   . Unspecified hemorrhoids without  mention of complication   . Unspecified hypothyroidism     Tobacco History: Social History   Tobacco Use  Smoking Status Never Smoker  Smokeless Tobacco Never Used   Counseling given: Not Answered   Outpatient Encounter Medications as of 01/25/2018  Medication Sig  . aspirin 81 MG tablet Take 81 mg by mouth daily with breakfast.   . Levothyroxine Sodium 150 MCG CAPS Take 1 capsule (150 mcg total) by mouth daily before breakfast.  . ALPRAZolam (XANAX) 0.5 MG tablet Take 1/2 to 1 tablet by mouth three times daily as needed (Patient not taking: Reported on 01/25/2018)  . benzonatate (TESSALON) 200 MG capsule Take 1 capsule (200 mg total) by mouth 3 (three) times daily as needed for cough.  . predniSONE (DELTASONE) 10 MG tablet 4 tabs for 2 days, then 3 tabs for 2 days, 2 tabs for 2 days, then 1 tab for 2 days, then stop  . [DISCONTINUED] azithromycin (ZITHROMAX) 250 MG tablet Take 2 today, then 1 daily until gone. (Patient not taking: Reported on 01/25/2018)  . [DISCONTINUED] chlorpheniramine-HYDROcodone (TUSSIONEX PENNKINETIC ER) 10-8 MG/5ML SUER Take 5 mLs by mouth every 12 (twelve) hours as needed for cough. (Patient not taking: Reported on 01/25/2018)  . [  DISCONTINUED] famciclovir (FAMVIR) 500 MG tablet Take 1 tablet (500 mg total) by mouth 3 (three) times daily. (Patient not taking: Reported on 01/25/2018)  . [DISCONTINUED] methylPREDNISolone (MEDROL) 4 MG tablet Take as directed (Patient not taking: Reported on 01/25/2018)  . [DISCONTINUED] predniSONE (DELTASONE) 20 MG tablet Take one tablet BID for 3 days, then one tablet daily for 3 days, then 1/2 tablet daily until gone. (Patient not taking: Reported on 01/25/2018)   No facility-administered encounter medications on file as of 01/25/2018.      Review of Systems  Constitutional:   No  weight loss, night sweats,  Fevers, chills,  +fatigue, or  lassitude.  HEENT:   No headaches,  Difficulty swallowing,  Tooth/dental problems, or  Sore  throat,                No sneezing, itching, ear ache,  +nasal congestion, post nasal drip,   CV:  No chest pain,  Orthopnea, PND, swelling in lower extremities, anasarca, dizziness, palpitations, syncope.   GI  No heartburn, indigestion, abdominal pain, nausea, vomiting, diarrhea, change in bowel habits, loss of appetite, bloody stools.   Resp:  .  No chest wall deformity  Skin: no rash or lesions.  GU: no dysuria, change in color of urine, no urgency or frequency.  No flank pain, no hematuria   MS:  No joint pain or swelling.  No decreased range of motion.  No back pain.    Physical Exam  BP 140/88 (BP Location: Left Arm, Cuff Size: Normal)   Pulse 65   Ht 5\' 9"  (1.753 m)   Wt 175 lb 3.2 oz (79.5 kg)   SpO2 97%   BMI 25.87 kg/m    GEN: A/Ox3; pleasant , NAD,   HEENT:  [place]/AT,  EACs-clear, TMs-wnl, NOSE-clear, THROAT-clear, no lesions, no postnasal drip or exudate noted.   NECK:  Supple w/ fair ROM; no JVD; normal carotid impulses w/o bruits; no thyromegaly or nodules palpated; no lymphadenopathy.    RESP  Clear  P & A; w/o, wheezes/ rales/ or rhonchi. no accessory muscle use, no dullness to percussion  CARD:  RRR, no m/r/g, no peripheral edema, pulses intact, no cyanosis or clubbing.  GI:   Soft & nt; nml bowel sounds; no organomegaly or masses detected.   Musco: Warm bil, no deformities or joint swelling noted.   Neuro: alert, no focal deficits noted.    Skin: Warm, no lesions or rashes    Lab Results:  CBC    Component Value Date/Time   WBC 4.9 04/06/2017 0832   RBC 5.12 04/06/2017 0832   HGB 16.4 04/06/2017 0832   HCT 45.7 04/06/2017 0832   PLT 241.0 04/06/2017 0832   MCV 89.2 04/06/2017 0832   MCH 32.4 12/27/2012 0435   MCHC 35.8 04/06/2017 0832   RDW 13.1 04/06/2017 0832   LYMPHSABS 1.7 04/06/2017 0832   MONOABS 0.6 04/06/2017 0832   EOSABS 0.2 04/06/2017 0832   BASOSABS 0.0 04/06/2017 0832    BMET    Component Value Date/Time   NA 140  04/06/2017 0832   K 3.6 04/06/2017 0832   CL 102 04/06/2017 0832   CO2 33 (H) 04/06/2017 0832   GLUCOSE 91 04/06/2017 0832   BUN 8 04/06/2017 0832   CREATININE 0.88 04/06/2017 0832   CALCIUM 9.4 04/06/2017 0832   GFRNONAA >90 12/21/2012 1404   GFRAA >90 12/21/2012 1404    BNP No results found for: BNP  ProBNP No results found for: PROBNP  Imaging: Dg Chest 2 View  Result Date: 01/25/2018 CLINICAL DATA:  Cough. EXAM: CHEST  2 VIEW COMPARISON:  Radiographs of February 19, 2016. FINDINGS: The heart size and mediastinal contours are within normal limits. Both lungs are clear. No pneumothorax or pleural effusion is noted. The visualized skeletal structures are unremarkable. IMPRESSION: No active cardiopulmonary disease. Electronically Signed   By: Marijo Conception, M.D.   On: 01/25/2018 17:20     Assessment & Plan:   BRONCHITIS, ACUTE Slow to resolve bronchitis  Hold on abx for now  Check cxr and labs   Plan  . Patient Instructions  Prednisone taper over next .  Mucinex Twice daily  As needed  Cough/congestion  Delsym 2 tsp Twice daily  As needed  Cough .  Zyrtec 10mg . At bedtime  For 1 week .  Tessalon Three times a day  As needed  Cough  Begin Zantac 150mg  Twice daily  For 2 weeks.  Chest xray and labs  Follow up Dr. Lenna Gilford  4 weeks and .As needed   Please contact office for sooner follow up if symptoms do not improve or worsen or seek emergency care       GERD tx for possible gerd that could be aggravating her cough   Plan Patient Instructions  Prednisone taper over next .  Mucinex Twice daily  As needed  Cough/congestion  Delsym 2 tsp Twice daily  As needed  Cough .  Zyrtec 10mg . At bedtime  For 1 week .  Tessalon Three times a day  As needed  Cough  Begin Zantac 150mg  Twice daily  For 2 weeks.  Chest xray and labs  Follow up Dr. Lenna Gilford  4 weeks and .As needed   Please contact office for sooner follow up if symptoms do not improve or worsen or seek emergency care           Rexene Edison, NP 01/25/2018

## 2018-01-26 LAB — BASIC METABOLIC PANEL
BUN: 14 mg/dL (ref 6–23)
CHLORIDE: 100 meq/L (ref 96–112)
CO2: 30 mEq/L (ref 19–32)
Calcium: 9.2 mg/dL (ref 8.4–10.5)
Creatinine, Ser: 1.01 mg/dL (ref 0.40–1.50)
GFR: 82.77 mL/min (ref >60.00)
Glucose, Bld: 85 mg/dL (ref 70–99)
POTASSIUM: 3.5 meq/L (ref 3.5–5.1)
Sodium: 140 mEq/L (ref 135–145)

## 2018-02-06 ENCOUNTER — Encounter: Payer: Self-pay | Admitting: Pulmonary Disease

## 2018-02-06 NOTE — Telephone Encounter (Signed)
Called pt who stated he is still having problems with coughing that all starts when he is laying down to sleep at night.  Pt states he will go to bed between 10-10:30 and has been taking OTC meds per TP switching from nyquil and delsym to try to help him sleep at night.  Pt states he is tired of taking OTC meds after taking them for 5 weeks with no relief.  Pt wakes up between 2-2:30 coughing and is saying his cough sounds like he has a lot of congestion and also has some sinus drainage.  Pt will not cough up any mucus but states he will occ vomit.  After pt wakes up coughing, he has to remain up or he will begin coughing again.  Pt will go to the recliner which helps relieve his coughing.  Pt denies any pain in his head/ sinus cavities and states he has no coughing during the day, only when he lays down to sleep at night.  Pt states he has had labwork and xrays which all have been normal and has been on mult meds to help with his symptoms but still no real relief after about 5 weeks.  Dr. Lenna Gilford, please advise on recommendations for pt, if we might be able to refer him somewhere to get further treatment or if there are other meds pt might be able to take to see if he will get any relief from this. Thanks!

## 2018-02-06 NOTE — Telephone Encounter (Signed)
Spoke with Dr. Lenna Gilford, patient has not been seen since May 2018. He has received multiple calls and messages about the patient's symptoms. He either needs to come in to be seen or we can place a referral to GI and possible ENT for the evaluation of acid reflux.   Message has been sent back to patient. Will await his decision.

## 2018-02-09 ENCOUNTER — Encounter: Payer: Self-pay | Admitting: Pulmonary Disease

## 2018-02-09 ENCOUNTER — Ambulatory Visit (INDEPENDENT_AMBULATORY_CARE_PROVIDER_SITE_OTHER): Payer: PRIVATE HEALTH INSURANCE | Admitting: Pulmonary Disease

## 2018-02-09 VITALS — BP 160/102 | HR 96 | Temp 98.0°F | Ht 69.0 in | Wt 176.0 lb

## 2018-02-09 DIAGNOSIS — R05 Cough: Secondary | ICD-10-CM | POA: Insufficient documentation

## 2018-02-09 DIAGNOSIS — R053 Chronic cough: Secondary | ICD-10-CM | POA: Insufficient documentation

## 2018-02-09 DIAGNOSIS — Z9884 Bariatric surgery status: Secondary | ICD-10-CM

## 2018-02-09 MED ORDER — PANTOPRAZOLE SODIUM 40 MG PO TBEC: DELAYED_RELEASE_TABLET | ORAL | 1 refills | Status: DC

## 2018-02-09 MED ORDER — PROMETHAZINE-CODEINE 6.25-10 MG/5ML PO SYRP: 5.0000 mL | ORAL_SOLUTION | Freq: Four times a day (QID) | ORAL | 0 refills | Status: DC | PRN

## 2018-02-09 MED ORDER — PROMETHAZINE-CODEINE 6.25-10 MG/5ML PO SYRP: 5.0000 mL | ORAL_SOLUTION | Freq: Every day | ORAL | 0 refills | Status: DC

## 2018-02-09 NOTE — Telephone Encounter (Signed)
Called pt, scheduled to see RA today at 3:00.

## 2018-02-09 NOTE — Progress Notes (Signed)
   Subjective:    Patient ID: Paul Shannon, male    DOB: [birth year], 52 y.o.   MRN: [id number]  HPI  52 yo male never smoker with HTN, GERD , fatty liver disease and dyslipidemia followed by Dr. Lenna Gilford  S/p lap band 2016 (lost 75lbs)   He presents for evaluation of a chronic cough that has persisted since December. He had URI symptoms and may be a chest cold at onset.  He initially tried over-the-counter medications such as Mucinex, NyQuil and Delsym without much relief, then was given a Z-Pak and a prednisone taper in January.  He was seen about 2 weeks ago nurse practitioner and given another prednisone taper,   CXR 01/25/18 was clear Labs wnl incl BNP, WBC  Cough is mostly dry and occurs mostly at night.  He hardly coughs during the day.  For the last 3 nights he has been unable to sleep and has slept in his recliner.  He reports a lot of burping and hiccups. After a paroxysmal cough   He also threw up clear water.  He denies wheezing, sputum production or nasal congestion or postnasal drip. Medication review does not reveal any culprits.  His blood pressure is high today he denies blurred  vision or headaches   Past Medical History:  Diagnosis Date  . Anxiety state, unspecified yrs ago  . Arthritis   . Biliary dyskinesia   . Diverticulitis   . Esophageal reflux   . Fatty liver    Dr Earlean Shawl   . History of kidney stones   . Hypertension   . Irritable bowel syndrome   . Overweight(278.02)   . Unspecified hemorrhoids without mention of complication   . Unspecified hypothyroidism     Review of Systems neg for any significant sore throat, dysphagia, itching, sneezing, nasal congestion or excess/ purulent secretions, fever, chills, sweats, unintended wt loss, pleuritic or exertional cp, hempoptysis, orthopnea pnd or change in chronic leg swelling. Also denies presyncope, palpitations, heartburn, abdominal pain, nausea, vomiting, diarrhea or change in bowel or urinary habits,  dysuria,hematuria, rash, arthralgias, visual complaints, headache, numbness weakness or ataxia.     Objective:   Physical Exam   Gen. Pleasant, well-nourished, in no distress ENT - no thrush, no post nasal drip Neck: No JVD, no thyromegaly, no carotid bruits Lungs: no use of accessory muscles, no dullness to percussion, clear without rales or rhonchi  Cardiovascular: Rhythm regular, heart sounds  normal, no murmurs or gallops, no peripheral edema Musculoskeletal: No deformities, no cyanosis or clubbing         Assessment & Plan:

## 2018-02-09 NOTE — Assessment & Plan Note (Signed)
Check your blood pressure once daily and call Dr Lenna Gilford if this remains high consistently for the next few days  Probably related to incessant coughing and lack of sleep last 2-3 days

## 2018-02-09 NOTE — Patient Instructions (Signed)
Cough may be related to reflux or less likely post bronchitic cough. Reflux may be acid or nonacid related.  Trial of Protonix 40 mg twice daily for 2 weeks and then once daily  Schedule upper GI series due to history of lap band.  Prescription for promethazine/codeine 5 mL at bedtime #120 ml  Check your blood pressure once daily and call Dr Lenna Gilford if this remains high consistently for the next few days

## 2018-02-09 NOTE — Assessment & Plan Note (Signed)
Cough may be related to reflux or less likely post bronchitic cough. Reflux may be acid or nonacid related.  Trial of Protonix 40 mg twice daily for 2 weeks and then once daily  Schedule upper GI series due to history of lap band.  Prescription for promethazine/codeine 5 mL at bedtime #120 ml to provide him symptomatically. Ideally I would do this sequentially so that we can at least figure out what works

## 2018-02-12 ENCOUNTER — Other Ambulatory Visit: Payer: Self-pay

## 2018-02-12 DIAGNOSIS — Z9884 Bariatric surgery status: Secondary | ICD-10-CM

## 2018-02-13 ENCOUNTER — Encounter: Payer: Self-pay | Admitting: Pulmonary Disease

## 2018-02-14 ENCOUNTER — Telehealth: Payer: Self-pay | Admitting: Pulmonary Disease

## 2018-02-14 NOTE — Telephone Encounter (Signed)
SN- please see email and advise recs thanks!  ----- Message from Shelby, Generic sent at 02/13/2018 10:09 PM EST -----    I seen Dr Davonna Belling on 3/1, during this my BP was around 160/105. Dr Davonna Belling asked that I keep a check on it for a few days. Well its not any better it has been all over the place. for the last 4 days 150/105 to 170/115. Hart rate stays between 55-60 laying down, up walking around or sitting it stays between 70-90. I see Dr Lenna Gilford on 3/11. Not sure if something needs to be called in. BP hasn't been a problem in 4 years.     Thanks  Eddi Hymes  [phone]  DOB 1966-04-18    Current Outpatient Medications on File Prior to Visit  Medication Sig Dispense Refill  . ALPRAZolam (XANAX) 0.5 MG tablet Take 1/2 to 1 tablet by mouth three times daily as needed 90 tablet 5  . aspirin 81 MG tablet Take 81 mg by mouth daily with breakfast.     . benzonatate (TESSALON) 200 MG capsule Take 1 capsule (200 mg total) by mouth 3 (three) times daily as needed for cough. 30 capsule 1  . Levothyroxine Sodium 150 MCG CAPS Take 1 capsule (150 mcg total) by mouth daily before breakfast. 90 capsule 3  . pantoprazole (PROTONIX) 40 MG tablet 1 tab twice daily for 2 weeks then 1 tablet day 30 tablet 1  . promethazine-codeine (PHENERGAN WITH CODEINE) 6.25-10 MG/5ML syrup Take 5 mLs by mouth at bedtime. 120 mL 0   No current facility-administered medications on file prior to visit.

## 2018-02-14 NOTE — Telephone Encounter (Signed)
Per SN- Remember no salt! He's done a good job with weight reduction. Monitor BP and bring copy to OV 3/11. Attempted to call Patient 02/14/18, but was unable to reach him. I left a message for him to call back.   Spoke with the pt and notified of recs per SN and he verbalized understanding

## 2018-02-14 NOTE — Telephone Encounter (Signed)
Per SN- Remember no salt! He's done a good job with weight reduction. Monitor BP and bring copy to OV 3/11. Attempted to call Patient 02/14/18, but was unable to reach him. I left a message for him to call back.

## 2018-02-15 ENCOUNTER — Ambulatory Visit (HOSPITAL_COMMUNITY)
Admission: RE | Admit: 2018-02-15 | Discharge: 2018-02-15 | Disposition: A | Discharge status: Home / Self Care | Payer: PRIVATE HEALTH INSURANCE | Source: Ambulatory Visit | Attending: Pulmonary Disease | Admitting: Pulmonary Disease

## 2018-02-15 ENCOUNTER — Telehealth: Payer: Self-pay | Admitting: Pulmonary Disease

## 2018-02-15 ENCOUNTER — Other Ambulatory Visit: Payer: PRIVATE HEALTH INSURANCE

## 2018-02-15 DIAGNOSIS — Z9884 Bariatric surgery status: Secondary | ICD-10-CM | POA: Insufficient documentation

## 2018-02-15 NOTE — Telephone Encounter (Signed)
Received call from [place] in the lab - pt is down there for labs for upcoming physical with SN on Monday 3/11  Upon inspection of patient's chart, his last physical was 5.1.18 w/ SN so next physical and labs will not be paid for by insurance until 5.2.19.  His next appt on 3/11 is a 4 week follow up from the 2.14.19 acute visit with TP.  Labs and cxr were done at that visit but everything was good so no indication to repeat testing.  Called Tonya in the lab again and discussed the above.  Tonya voiced her understanding and will relay to patient  Nothing further needed; will sign off.

## 2018-02-19 ENCOUNTER — Ambulatory Visit (INDEPENDENT_AMBULATORY_CARE_PROVIDER_SITE_OTHER): Payer: PRIVATE HEALTH INSURANCE | Admitting: Pulmonary Disease

## 2018-02-19 ENCOUNTER — Encounter: Payer: Self-pay | Admitting: Pulmonary Disease

## 2018-02-19 VITALS — BP 140/88 | HR 70 | Temp 98.2°F | Ht 69.0 in | Wt 172.2 lb

## 2018-02-19 DIAGNOSIS — R1310 Dysphagia, unspecified: Secondary | ICD-10-CM | POA: Diagnosis not present

## 2018-02-19 DIAGNOSIS — R05 Cough: Secondary | ICD-10-CM

## 2018-02-19 DIAGNOSIS — Z9884 Bariatric surgery status: Secondary | ICD-10-CM

## 2018-02-19 DIAGNOSIS — I10 Essential (primary) hypertension: Secondary | ICD-10-CM

## 2018-02-19 DIAGNOSIS — R1319 Other dysphagia: Secondary | ICD-10-CM

## 2018-02-19 DIAGNOSIS — R053 Chronic cough: Secondary | ICD-10-CM

## 2018-02-19 DIAGNOSIS — M159 Polyosteoarthritis, unspecified: Secondary | ICD-10-CM

## 2018-02-19 DIAGNOSIS — M150 Primary generalized (osteo)arthritis: Secondary | ICD-10-CM

## 2018-02-19 MED ORDER — AMLODIPINE BESYLATE 5 MG PO TABS: 5.0000 mg | ORAL_TABLET | Freq: Every day | ORAL | 3 refills | Status: DC

## 2018-02-19 NOTE — Progress Notes (Signed)
Subjective:    Patient ID: Paul Shannon, male    DOB: 1966-03-29, 52 y.o.   MRN: [id number]  HPI 52 y/o WM here for a follow up visit & CPX... he has multiple medical problems as noted below...  ~  SEE PREV EPIC NOTES FOR OLDER DATA >>    ~  Hospitalized 04/2011 w/ acute diverticulitis, treated w/ Cipro & Flagyl & slowly improved; CT Abd revealed acute diverticulitis in prox sigmoid w/ perforation & pericolonic inflamm, and fatty liver disease; [facility] consult by DrCornett w/ rec for med rx & no surg intervention...  ~  [id number]:  referred to [facility] for Cholecystectomy & intraoperative liver biopsy to further investigate the status of his liver disease (recall hx of father passing away w/ cirrhosis due to this condition) as his last bx was in the 63's;  He did well w/ surgery & had neg intraop cholangiogram, & an uneventful recovery;  Liver Bx however showed progressive HepSteatosis- 70% steatosis & total NASH score of 8 (out of poss 9);    LABS 5/13 in hosp:  Elev LFTs as noted;  CBC- ok   LABS 7/13:  FLP- ok on diet alone;  Chems- ok x persist incr LFTs due to steatosis;  TSH= 0.19 on Synth200 & we decided to slowly wean... ~  January 21, 2013:  s/p Lap Band surg for his Hepatic Steatosis & pos FamHx cirrhosis from this condition (his insurance wouldn't cover a gastric procedure); he has already lost over 30# down to 202# today & he says "I never would have known how bad I really felt til I could compare to how good I feel now" and he states he is now eating just 10% of what he ate before...  LABS 2/14:  FLP- at goals on diet alone;  Chems- wnl & LFTs now normal;  CBC- wnl;  TSH=112 & repeat=66 on Levothy27mg/d > asked to take it every AM, on empty stomach, don't eat for 1H, etc... ~  February 12, 2014:  [name]'s weight was down to 175# after his LapBand surg, but he gained up to 199# last yr & now at 194# and his LFTs remain wnl... He has a BTeaching laboratory technician(Ronalee Belts w/ elevLFTs & steatosis; and another Bro w/  [place]...    LABS 3/15:  FLP- at goals on diet alone;  Chems- wnl;  CBC- wnl;  TSH=0.16...   ~  April 07, 2015:  141moOV & CPX> FrKareyeports doing well, no new complaints or concerns; his weight is stable, BMI~28, appetite good, etc; he has to chew hard foods like steaks very well to keep them from getting "hung-up" & if this happens he can drink water & then vomit it all back up (occurs every 4-35m85mopending on what he eats)...  He had right foot drop after the lap band surg- [facility]; he reports that MRI was neg but NCV was abn & surg on lateral right leg near fibular head relieved pressure on the nerve & cured the problem... We reviewed the following medical problems during today's office visit >>     HBP> prev on ASA81 (he stopped), & no BP meds; BP= 128/76 & he denies CP, palpit, SOB, edema; we discussed that he might have to restart BP meds if wt increases...    Lipids> on diet alone; wt is down as noted after LapBand surg & stable ~200#; FLP 4/16 shows TChol 146, TG 95, HDL 50, LDL  77    Hypothyroid> on Levothyroid 169mg daily; hx of suspect med compliance in past; clinically & biochem euthyroid but TSH 4/16 = 52, he swears he's taking it Qam on empty stomach & npo for 390m; we will refer to Endocrine to figure this out    Overweight> [facility] by [facility] history & he est that he is eating 10% of what he ate before; wt is down ~40# from his peak...    GI-GERD, Divertics, IBS, Hems, s/p GB> on AnusolHC cream as needed; not requiring PPI rx etc; he denies abd pain, n/v, c/d, blood seen...    Fatty Liver Dis> DrNewman mentioned that liver looked some better at the time of his surg; LFTs much improved & now remain WNL after lap band & wt loss...    DJD, right knee discomfort> hx right knee arthroscopy 10/11 by DrWainer; stable & he avoids NSAIDs... He had right foot drop after lap band surg &  required NS by DrDJones to move nerve near fibular head w/ complete resolution...    Anxiety> prev on Alpraz0.5 prn but he has been off all meds for some time- doing well w/o anxiety symptoms... We reviewed prob list, meds, xrays and labs> see below for updates >>   CXR 4/16 showed norm heart size, clear lungs, NAD...Marland KitchenMarland Kitchen EKG 4/16 showed SBrady, rate56, LAD, IRBBB, otherw NAD...  LABS 4/16:  FLP- at goals on diet alone;  Chems- wnl w/ norm LFTs;  CBC- wnl;  TSH=52 on Levothy175!  PSA=0.95... PLAN>>  He continues to do well s/p lap band wt reduction surg & LFTs have remained wnl;  The volitility in his TFTs are unexplained- prev on Levothy200 his TSH was 0.16, decreased to 17546md and TSH=52!  He states taking it every day 1st thing in the AM & NPO for 68m60mfter dosing- we will refer to Endocrine to sort this out...  ~  Apr 11, 2016:  Yearly ROV & CPX>  [name] feeling well, no new complaints or concerns; he is maintaining his weight- now at 187# on his diet w/ lap band in place; he denies CP, palpit, dizzy, SOB, edema; he had a URI 02/2016 treated by TP w/ Levaquin, Pred, Mucinex & resolved;  He also reports left knee arthritis w/ fluid tapped DrWainer, on Mobic 15mg35m & he continues to follow (we do not have notes from Ortho)... we reviewed the following medical problems during today's office visit >>     HBP> on ASA81; no BP meds; BP= 142/88 & he denies CP, palpit, SOB, edema; we discussed that he might have to restart BP meds if wt increases...    Lipids> on diet alone; wt is down as noted after LapBand surg & stable <200#; FLP [place] shows TChol 139, TG 99, HDL 41, LDL 78... rec to continue diet/ exercise...    Hypothyroid> on Levothyroid 200mcg59mking 6.5 tabs/wk); last yr on 175mcg/64ms TSH was 52 & we27eviewed taking med 1st thing in AM etc=> sent him to endocrine & he saw DrKumar 08/2015- noted inconsistent TSH readings, he noted pt was taking a calcium supplement at the same time &  switched this to evening, & his TSH stabilized (all<1.00 w/ norm FreeT4)... LAB 03/2016 showed TSH=0.15, FreeT4=1.34...    Overweight> he [facility] much better by his history & he est that he is eating 10% of what he ate before;  wt is down ~40# from his peak & holding <200# w/ normal LFTs...    GI-GERD, Divertics, IBS, Hems, s/p GB> on AnusolHC cream as needed; not requiring PPI rx etc; he denies abd pain, n/v, c/d, blood seen...    Fatty Liver Dis> DrNewman mentioned that liver looked some better at the time of his surg; LFTs remain WNL after lap band & wt loss; recall +FamHx Steatosis & father died from cirrhosis, pt's bro refused lap band surg...    DJD, right knee discomfort> hx right knee arthroscopy 10/11 by DrWainer; stable & he avoids NSAIDs... He had right foot drop after lap band surg & required NS by DrDJones to move nerve near fibular head w/ complete resolution;  He had knee shot by DrWainer for joint effusion 2017.    Anxiety> prev on Alpraz0.5 prn but he has been off all meds for some time- doing well w/o anxiety symptoms... 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  LABS 04/14/16>  FLP- at goals on diet alone;  Chems- wnl w/ norm LFTs;  CBC- wnl;  PSA=0.94;  Last TSH was 03/2016 by DrKumar- TSH=0.15, FreeT4=1.34 IMP/PLAN>>  Jaymien is stable overall- issues of NASH resolved after lap band surg & signif wt reduction; Thyroid improved after moving his calcium supplement to PM; DrWainer manages his arthritis- left knee arthrocentesis and Mobic rx; Labs look good...   ~  Apr 11, 2017:  37yrROV & medical follow up visit>  FEkamreports a good yr overall-- he's had 2 issues to discuss:   1) arthritis left knee, s/o another arthroscopy in 2017 by drWainer (no notes avail to review), he is rec to use a brace w/ physical activity 7 use advil vs Aleve for pain & swelling, he will stay in touch w/ Ortho... 2) new prob- small lesion outbreak on right wrist area laterally, small blister- ?shingles eruption? & asked to watch for other lesions in C6-7 ditribution, we will Rx w/ Famvir500Tis x7d & Medrol Dosepak... We reviewed the following medical problems during today's office visit >>     Borderline HBP> on ASA81; no BP meds; BP= 122/70 after wt loss & he denies CP, palpit, SOB, edema; rec to avoid sodium 7 keep wt down!    Lipids> on diet alone (the way he likes it); wt is down as noted after LapBand surg & stable 185# today; FLP 04/2017 shows TChol 95, TG 73, HDL 42, LDL 39... WOW, fabulous, continue diet rx...    Hypothyroid> on Levothyroid 2074m (taking 6.5 tabs/wk); in 2016 on 17574md his TSH was 52 & we reviewed taking med 1st thing in AM etc=> sent him to endocrine & he saw DrKumar 08/2015- noted inconsistent TSH readings, he noted pt was taking a calcium supplement at the same time & switched this to evening, & his TSH stabilized (all<1.00 w/ norm FreeT4)... LAB 03/2016 showed TSH=0.15, FreeT4=1.34; LAB 4/18 showed TSH=0.05 & needs to decr.    Overweight> he had Lap Band surg [facility]12 [facility] much better by his history; wt is down ~40# from his peak & holding <200# w/ normal LFTs...    GI-GERD, Divertics, IBS, Hems, s/p GB> on AnusolHC cream as needed; not requiring  PPI rx etc; he denies abd pain, n/v, c/d, blood seen...    Fatty Liver Dis> DrNewman mentioned that liver looked some better at the time of his surg; LFTs remain WNL after lap band & wt loss; recall +FamHx Steatosis & father died from cirrhosis, pt's bro refused lap band surg...    DJD, bilat knee pain> hx right knee arthroscopy 2011 & left knee arthroscopy 2017 by DrWainer; stable & he avoids NSAIDs... He had right  foot drop after lap band surg & required NS by DrDJones to move nerve near fibular head w/ complete resolution;  We discussed knee brace, Advil vs aleve prn & stay in contact w/ Ortho...    Anxiety> prev on Alpraz0.5 prn but he has been off all meds for some time- doing well w/o anxiety symptoms... 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  LABS 04/08/17>  FLP- WOW all parameters are WNL;  Chems- wnl w/ K=3.6, BS=91, Cr=0.88, LFTs wnl;  CBC- wnl w/ Hg=16.4;  TSH=0.05 on Synthroid200 & he is decr to 150;  PSA=1.41... IMP/PLAN>>  Mohsin is stable overall, great job w/ wt loss & resolved NAFLD- has prob shingles right writ (C6-7) & we will Rx w/ Famvir, Medrol;  TSH is oversuppressed now that he is taking it properly & no interference w/ absorption- rec to decr to 151mg/d w/ f/u labs;  His CC is his knee arthritis, managed by drWainer, Ortho- s/p bilat arthroscopic procedures and steroid injections, pt will try brace, use OTC NSAIDs as needed & stay in touch w/ Orthop...    ~  February 19, 2018:  118moOV & medical follow up>  FrZakarias seen by TP on 01/25/18 for an acute visit c/o cough; non-smoker w/o hx pulm problems, [facility] due to hepatic steatosis and a FamHx of same where his father died from cirrhosis due to this same underlying condition; [place] last 709bs w/ resolution of LFT abn & improved liver morphology;  C/o hx severe bronchitis first of the year, given ZPak w/o much change, given Pred taper & Tussionex w/ improvement; now 1wk hx incr cough & it's worse qhs keeping him awake; denies f/c/s, discolored mucus or hemoptysis;  CXR was clear- NAD;  Rx w/ Pred/ Mucinex/ Tessalon, Zantac150Bid...      He ret & saw DrAlva 02/09/18>  Chr cough since DeAYT0160cough is dry & worse Qhs, unable to sleep & using recliner, he has vomited clear water, notes a lot of gas; Changed to Protonix40bid & UGIS ordered (see below), given Phen w/ codeine... 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  CXR 01/25/18 showed norm hear size, clear lungs- NAD  LABS 01/25/18>  Chems- wnl w/ BS=85, Cr=1.01;  BNP=17;  CBC- wnl w/ Hg=16  Ba swallow. Esophagram 02/15/18 showed diffuse esoph dilatation w/ poor persstalsis, only a sm amt of barium trickled past the lap  band w/ signif retention of barium in the esoph even in the upright position IMP/PLAN>>  It is apparent that the prob is Franklyn's lap-band, it has become way too tight & needs saline removed ASAP w/ f/u by [facility]- DrNewman; I called DrNewman & they will see hime this afternoon 200PM..[name]          Problem List:  HYPERTENSION (ICD-401.9) - controlled on HYZAAR 100-25 daily... BP=116/76 today, & he states OK at home ~130's to 140/ 80's-90... denies HA, fatigue, visual changes, CP, palipit, dizziness, syncope, dyspnea, edema, etc...  ~  CXR 4/11 showed low lung vol's but clear, normal Cor, NAD. ~  EKG showed NSR rate 60, no signif STTWA's, WNL. ~  CXR 5/13 showed normal heart size, clear lungs, NAD... ~  2/14: on ASA81, Hyzaar100-25, K20Bid; BP= 108/70 assoc w/ his wt reduction of 36# total so far; he denies CP, palpit, SOB, edema; we decided to decr the Hyzaar to 1/2 tab daily & cut the K20 to one daily... ~  3/15: on ASA81, & off all BP meds; BP= 136/88 & he denies CP, palpit, SOB, edema; we discussed that he might have to restart BP meds if wt increases... ~  4/16: prev on ASA81 (he  stopped), & no BP meds; BP= 128/76 & he denies CP, palpit, SOB, edema; we discussed that he might have to restart BP meds if wt increases...  HYPERCHOLESTEROLEMIA (ICD-272.0) - he has refused meds preferring to attempt control on diet alone... he was intol to Niacin in past w/ flushing... intol to Crestor10 when tried in Sep09... ~  [place] 3/08 showed TChol 143, TG 190, HDL 30, LDL 75... rec to start fibrate- he declined meds. ~  [place] 9/09 showed TChol 143, TG 123, HDL 20, LDL 99... rec to start Crestor93m/d- he agrees. ~  pt states that he didn't tolerate the Crestor10 either, therefore stopped it. ~  FLP 4/11 showed TChol 130, TG 94, HDL 33, LDL 79... much improved, on diet alone. ~  FLP 6/12 on diet alone (after divertic episode) showed TChol 131, TG 112, HDL 30, LDL 78... rec incr exercise. ~  FLP 7/13 on diet alone showed TChol 123, TG 123, HDL 34, LDL 65 ~  FLP 2/14 on diet alone w/ 36# wt reduction so far showed TChol 135, TG 55, HDL 55, LDL 69 ~  FLP 3/15 on diet alone showed TChol 101, TG 52, HDL 37, LDL 54  HYPOTHYROIDISM (ICD-244.9) - on SYNTHROID 2079m/d w/ ?compliance issues...  ~  labs 3/08 showed TSH=20.5 on Synthroid 17558md and dose increased to 200. ~  labs 9/09 showed TSH= 6.65 on 200m29m... continue same med. ~  ran out of meds Feb10 and didn't refill- labs 3/10 showed TSH= 93... rec> restart LEVOTHY200. ~  labs 4/11 showed TSH= 0.61... clinically euthyroid, may need to cut back dose. ~  Labs 6/12 showed TSH= 1.09 ~  Labs 7/13 on Synthroid200 showed TSH= 0.19 & we decided to decr the Synthroid to 175mc12m.. ~  Labs 2/14 on Synthroid200 showed TSH=112, repeat=66; we discussed taking med every day, 1st thing in the AM on empty stomach, don't eat for 1h; I discussed this by phone w/ drEllison & he concurred that med compliace was the most likely explanation... ~  Labs 3/15 on Synthroid200 showed TSH=0.16 and we decided to decr dose to 175mcg44m  Labs 4/16 on Synthroid175  showed TSH=52!  ?what is going on? He assures me that he takes it every day, 1st thing  in the AM & npo for 66mn after dosing... We will refer to ENDOCRINE for their review.  OVERWEIGHT (ICD-278.02) - since we first met Monnie in 1997 his weight has been 220-230 range... occas down to 210-215, and once down to 205# in 2001... highest weight has been 232#..[name] ~  weight 3/10 = 230# ~  weight 4/11 = 228# ~  Weight 6/12 = 221#... This was after his diverticulitis episode. ~  Weight 7/13 = 235# ~  Weight 2/14 = 202#... s/p Lap Band surg 1/14. ~  Weight 2/14 = 199# ~  Weight 3/15 = 194# ~  Weight 4/16 = 198#  GERD (ICD-530.81) - uses OTC Prilosec as needed. ~  EGD 3/13 by DrKaplan showed a benign nodule in the distal esoph, otherw neg...  DIVERTICULITIS>  Adm to hosp 5/12 w/ acute sigmoid diverticulitis;  treated w/ Cipro & Flagyl & slowly improved; CT Abd revealed acute diverticulitis in prox sigmoid w/ perforation & pericolonic inflamm, and fatty liver disease; [facility] consult by DrCornett w/ rec for med rx & no surg intervention; he has a post hosp follow up appt w/ DrKaplan ==> pending 6/12 ~  Colonoscopy 6/12 by DrKaplan showed mod divertics in the sigmoid, otherw neg & f/u planned 1101yr..  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 he uses OTC PrepH.  FATTY LIVER DISEASE (ICD-571.8) -  he has hepatic steatosis w/ [facility]... there is a pos family hx of cirrhosis in his father who died from this disease... he has been counselled on weight reduction and told to avoid hepatotoxins including alcohol...  he has discussed Hepatic Steatosis/ Fatty Liver Disease w/ GI, and we have provided him w/ mult hand-out about this condition...  he understands that it can lead to cirrhosis and death from liver failure... he understands that he must get his weight down, control his fat intake, and follow the FLP & LFT's  carefully... ~  LFTs 3/08 showed SGOT= 61, SGPT= 100... rec> get wt down. ~  LFTs 9/09 showed SGOT= 43, SGPT= 62... improved. ~  LFTs 2/10 showed SGOT= 29, SGPT= 39... normal! ~  LFTs 4/11 showed SGOT= 60, SGPT= 97... must diet & get wt down! 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 ~  LFTs 6/13 showed SGOT= 121, SGPT= 164... Pt referred back to GI for his severe hepatic steatosis... ~  LFTs 1/14 s/p Lap Band surg for his steatohepatitis showed SGOT=47, SGPT=72...  ~  LFTs 2/14 (wt=199#) showed SGOT=38, SGPT=34; much improved... ~  LFTs have remained WNL since his lap-band surg & weight loss to <200#...  Small lesion in the skin on the underside of the scrotum >> presented 2/14 w/ this superfic infection; treated w/ Sitz, Neosporin, SeptraDSBid... ~  2/14: pt reports much better & lesion resolved on Rx...  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 Hx right foot drop after lap-band surg w/ eval by DrWainer & NS-DrDJones w/ neg MRI back but abn NCVs; ultimately had surg on the nerve right lat knee/fibular head & all his symptoms resolved...  ANXIETY (ICD-300.00) - uses Alprazolam Prn...  DERM - mult tattoos...  Health Maintenance -  ~  GU:  age [age]PSA checked 2/10 due to symptoms  and normal at 0.85... ~  GI:  had FlexSig in 1998 for IBS symptoms by [facility] & it was neg... GI f/u pending w/ DrKaplan s/p diverticulitis. ~  Immunizations:  ? last Tetanus shot (he agrees to TDAP today)... hasn't had Pneumovax, Flu shots (refuses), or HepB series...  << NOTE: GIVEN COMBINED HEP A & HEP B VACCINE BY [facility] 7-8/13 >>   Past Surgical History:  Procedure Laterality Date  . BREATH TEK H PYLORI  10/02/2012   Procedure: [place];  Surgeon: Shann Medal, MD;  Location: [facility];  Service:  General;  Laterality: N/A;  . CHOLECYSTECTOMY  05/11/2012  . COLONOSCOPY    . KNEE SURGERY  2011   right knee   . LAPAROSCOPIC GASTRIC BANDING  12/25/2012   Procedure: LAPAROSCOPIC GASTRIC BANDING;  Surgeon: Shann Medal, MD;  Location: [facility];  Service: General;  Laterality: N/A;  Lap Band Placement  . LIVER BIOPSY    . LIVER BIOPSY  12/25/2012   Procedure: LIVER BIOPSY;  Surgeon: Shann Medal, MD;  Location: [facility];  Service: General;;  . MESH APPLIED TO LAP PORT  12/25/2012   Procedure: MESH APPLIED TO LAP PORT;  Surgeon: Shann Medal, MD;  Location: [facility];  Service: General;;  . TONSILLECTOMY AND ADENOIDECTOMY  as child    Outpatient Encounter Medications as of 02/19/2018  Medication Sig  . ALPRAZolam (XANAX) 0.5 MG tablet Take 1/2 to 1 tablet by mouth three times daily as needed  . aspirin 81 MG tablet Take 81 mg by mouth daily with breakfast.   . benzonatate (TESSALON) 200 MG capsule Take 1 capsule (200 mg total) by mouth 3 (three) times daily as needed for cough.  . Levothyroxine Sodium 150 MCG CAPS Take 1 capsule (150 mcg total) by mouth daily before breakfast.  . pantoprazole (PROTONIX) 40 MG tablet 1 tab twice daily for 2 weeks then 1 tablet day  . promethazine-codeine (PHENERGAN WITH CODEINE) 6.25-10 MG/5ML syrup Take 5 mLs by mouth at bedtime.  [name] amLODipine (NORVASC) 5 MG tablet Take 1 tablet (5 mg total) by mouth daily.   No facility-administered encounter medications on file as of 02/19/2018.     Allergies  Allergen Reactions  . Niacin     REACTION: pt states INTOL w/ flushing  . Other     Red dye=rash   . Penicillins     REACTION: childhood - extreme rash  . Red Dye   . Rosuvastatin     REACTION: pt states INTOL    Immunization History  Administered Date(s) Administered  . Hep A / Hep B 06/26/2012, 07/03/2012, 07/27/2012, 07/31/2013  . Td 03/19/2010    Current Medications, Allergies, Past Medical History, Past Surgical History, Family History, and  Social History were reviewed in [employer] record.   Review of Systems        The patient complains of joint pain and paresthesias.  The patient denies fever, chills, sweats, anorexia, fatigue, weakness, malaise, weight loss, sleep disorder, blurring, diplopia, eye irritation, eye discharge, vision loss, eye pain, photophobia, earache, ear discharge, tinnitus, decreased hearing, nasal congestion, nosebleeds, sore throat, hoarseness, chest pain, palpitations, syncope, dyspnea on exertion, orthopnea, PND, peripheral edema, cough, dyspnea at rest, excessive sputum, hemoptysis, wheezing, pleurisy, nausea, vomiting, diarrhea, constipation, change in bowel habits, abdominal pain, melena, hematochezia, jaundice, gas/bloating, indigestion/heartburn, dysphagia, odynophagia, dysuria, hematuria, urinary frequency, urinary hesitancy, nocturia, incontinence, back pain, joint swelling, muscle cramps, muscle weakness, stiffness, arthritis, sciatica, restless  legs, leg pain at night, leg pain with exertion, rash, itching, dryness, suspicious lesions, paralysis, seizures, tremors, vertigo, transient blindness, frequent falls, frequent headaches, difficulty walking, depression, anxiety, memory loss, confusion, cold intolerance, heat intolerance, polydipsia, polyphagia, polyuria, unusual weight change, abnormal bruising, bleeding, enlarged lymph nodes, urticaria, allergic rash, hay fever, and recurrent infections.     Objective:   Physical Exam      WD, Overweight, 52 y/o WM in NAD... GENERAL:  Alert & oriented; pleasant & cooperative... HEENT:  [place]/AT, EOM-wnl, PERRLA, EACs-clear, TMs-wnl, NOSE-clear, THROAT-clear & wnl... NECK:  Supple w/ full ROM; no JVD; normal carotid impulses w/o bruits; no thyromegaly or nodules palpated; no lymphadenopathy... CHEST:  Clear to P & A; without wheezes/ rales/ or rhonchi heard... HEART:  Regular Rhythm; without murmurs/ rubs/ or gallops detected... ABDOMEN:   Soft & min tender LLQ; normal bowel sounds; no organomegaly or masses palpated...  RECTAL:  neg- prostate 2+ normal, stool heme neg, few ext hem's noted... EXT: without deformities, mild arthritic changes;   NEURO:  CN's intact; motor testing normal; sensory testing normal; gait normal & balance OK. DERM:  No lesions noted; no rash, several tatoos...   Assessment & Plan:    02/19/18>  It is apparent that Daishaun's prob is the lap-band which has become wat too tight; I called DrNewman at [facility] & they will see him this afternoon at 200PM to remove some saline from the band & follow him going forward...    HBP>  Controlled off all BP meds on diet alone since he's lost the weight...  CHOL>  Improved w/ wt reduction, advised to keep up the wt reduction efforts & low fat diet...  HYPOTHYROID>  Labs have been up & down on dose of 175-225mg/d, it makes no sense since he claims he's taking it every day 1st thing in the AM, & npo x 1H;  DrKumar found that he was taking calcium supplement at the same time & switching this to the eve has made all the difference...     04/11/17>  TSH=0.05 now & we will decr dose to 1565m/d w/ f/u labs...  OBESITY>  He [facility] now [facility] ~40# & he feels much better...  GI>  GERD/ Divertics/ IBS/ Fatty Liver>  Followed by DrDemetra Shineror GI...  DIVERTICULITIS>  AS ABOVE- acute diverticulitis 5/12 w/ perf & pericolonic inflamm, improved w/ antibiotic Rx & f/u colon 6/12 was otherw neg...  NAFLD>  This is his most serious medical condition w/ liver bx proving marked progression over the last 10+yrs; his father died from this disease; he had bariatric surg 1/14 & has lost >40#, feeling better & LFTs improved... We will continue to follow...  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   Patient's Medications  New Prescriptions   AMLODIPINE (NORVASC) 5 MG TABLET    Take 1 tablet (5 mg total) by mouth daily.  Previous  Medications   ALPRAZOLAM (XANAX) 0.5 MG TABLET    Take 1/2 to 1 tablet by mouth three times daily as needed   ASPIRIN 81 MG TABLET    Take 81 mg by mouth daily with breakfast.    BENZONATATE (TESSALON) 200 MG CAPSULE    Take 1 capsule (200 mg total) by mouth 3 (three) times daily as needed for cough.   LEVOTHYROXINE SODIUM 150 MCG CAPS    Take 1 capsule (150 mcg total) by mouth daily before breakfast.   PANTOPRAZOLE (PROTONIX)  40 MG TABLET    1 tab twice daily for 2 weeks then 1 tablet day   PROMETHAZINE-CODEINE (PHENERGAN WITH CODEINE) 6.25-10 MG/5ML SYRUP    Take 5 mLs by mouth at bedtime.  Modified Medications   No medications on file  Discontinued Medications   No medications on file

## 2018-02-19 NOTE — Patient Instructions (Signed)
Today we updated your med list in our EPIC system...    Continue your current medications the same...  We decided to add a step-one antihypertensive medication>>    Start the new AMLODIPINE (Norvasc) 5mg  tabs one tab daily...    Monitor your BP at home & plan to give me a call in 2-3 weeks w/ your pressure readings...  I called [facility] & DrNewman's PA will see you today at 2:00PM to remove some saline from the lap-band to help your problem...  Call for any questions.Marland KitchenMarland Kitchen

## 2018-03-04 ENCOUNTER — Other Ambulatory Visit: Payer: Self-pay | Admitting: Pulmonary Disease

## 2018-03-07 ENCOUNTER — Other Ambulatory Visit: Payer: Self-pay | Admitting: Pulmonary Disease

## 2018-03-07 ENCOUNTER — Telehealth: Payer: Self-pay | Admitting: Pulmonary Disease

## 2018-03-07 MED ORDER — LEVOFLOXACIN 500 MG PO TABS: 500.0000 mg | ORAL_TABLET | Freq: Every day | ORAL | 0 refills | Status: DC

## 2018-03-07 NOTE — Telephone Encounter (Signed)
Per SN- Levaquin 500mg  #7 take 1 by mouth daily. Spoke with Patient, he stated understanding and that he wanted prescription sent to [facility] on [address] that we have on file. Order placed. Nothing further needed at this time.

## 2018-03-07 NOTE — Telephone Encounter (Signed)
Called and spoke with pt who stated he has been having a productive cough with brown mucus, congestion, and clear postnasal drainage that started yesterday, 03/06/18.  Pt stated he began taking mucinex last night, but now he states he can tell it has moved down to his chest.  Pt denies any fever.  Dr. Lenna Gilford, please advise on recommendations for pt.  Allergies  Allergen Reactions  . Niacin     REACTION: pt states INTOL w/ flushing  . Other     Red dye=rash   . Penicillins     REACTION: childhood - extreme rash  . Red Dye   . Rosuvastatin     REACTION: pt states INTOL    Current Outpatient Medications:  .  ALPRAZolam (XANAX) 0.5 MG tablet, Take 1/2 to 1 tablet by mouth three times daily as needed, Disp: 90 tablet, Rfl: 5 .  amLODipine (NORVASC) 5 MG tablet, Take 1 tablet (5 mg total) by mouth daily., Disp: 30 tablet, Rfl: 3 .  aspirin 81 MG tablet, Take 81 mg by mouth daily with breakfast. , Disp: , Rfl:  .  benzonatate (TESSALON) 200 MG capsule, Take 1 capsule (200 mg total) by mouth 3 (three) times daily as needed for cough., Disp: 30 capsule, Rfl: 1 .  Levothyroxine Sodium 150 MCG CAPS, Take 1 capsule (150 mcg total) by mouth daily before breakfast., Disp: 90 capsule, Rfl: 3 .  pantoprazole (PROTONIX) 40 MG tablet, TAKE 1 TABLET BY MOUTH TWICE DAILY FOR TWO WEEKS, THEN 1 TABLET DAILY, Disp: 168 tablet, Rfl: 1 .  promethazine-codeine (PHENERGAN WITH CODEINE) 6.25-10 MG/5ML syrup, Take 5 mLs by mouth at bedtime., Disp: 120 mL, Rfl: 0

## 2018-04-13 ENCOUNTER — Other Ambulatory Visit: Payer: PRIVATE HEALTH INSURANCE

## 2018-04-13 ENCOUNTER — Encounter: Payer: Self-pay | Admitting: Pulmonary Disease

## 2018-04-13 NOTE — Telephone Encounter (Signed)
[facility] email to SN to make aware.  ---------------------- I stop by this morning to do lab work before my physical on 5/7. They wouldn't do them due to there was no request from Dr Lenna Gilford to do blood work.

## 2018-04-16 ENCOUNTER — Other Ambulatory Visit: Payer: Self-pay | Admitting: Pulmonary Disease

## 2018-04-16 DIAGNOSIS — Z0000 Encounter for general adult medical examination without abnormal findings: Secondary | ICD-10-CM

## 2018-04-16 NOTE — Progress Notes (Signed)
Bmp

## 2018-04-17 ENCOUNTER — Encounter: Payer: Self-pay | Admitting: Pulmonary Disease

## 2018-04-17 ENCOUNTER — Ambulatory Visit (INDEPENDENT_AMBULATORY_CARE_PROVIDER_SITE_OTHER): Payer: PRIVATE HEALTH INSURANCE | Admitting: Pulmonary Disease

## 2018-04-17 VITALS — BP 136/74 | HR 86 | Temp 97.9°F | Ht 69.0 in | Wt 180.8 lb

## 2018-04-17 DIAGNOSIS — K760 Fatty (change of) liver, not elsewhere classified: Secondary | ICD-10-CM | POA: Diagnosis not present

## 2018-04-17 DIAGNOSIS — Z0000 Encounter for general adult medical examination without abnormal findings: Secondary | ICD-10-CM | POA: Diagnosis not present

## 2018-04-17 DIAGNOSIS — M159 Polyosteoarthritis, unspecified: Secondary | ICD-10-CM

## 2018-04-17 DIAGNOSIS — Z9884 Bariatric surgery status: Secondary | ICD-10-CM

## 2018-04-17 DIAGNOSIS — R053 Chronic cough: Secondary | ICD-10-CM

## 2018-04-17 DIAGNOSIS — I10 Essential (primary) hypertension: Secondary | ICD-10-CM | POA: Diagnosis not present

## 2018-04-17 DIAGNOSIS — M150 Primary generalized (osteo)arthritis: Secondary | ICD-10-CM | POA: Diagnosis not present

## 2018-04-17 DIAGNOSIS — R05 Cough: Secondary | ICD-10-CM

## 2018-04-17 DIAGNOSIS — E039 Hypothyroidism, unspecified: Secondary | ICD-10-CM

## 2018-04-17 NOTE — Patient Instructions (Signed)
Today we updated your med list in our EPIC system...    Continue your current medications the same...  Please return to our lab one morning this week for your FASTING blood work...    We will contact you w/ the results when available...   Keep your follow up visits w/ [facility] for swallowing follow up & lap-band adjustments...  We reviewed the needed ANTIREFLUX REGIMEN:    Take the Protonix ~24min before the evening meal...    Do not eat or drink after dinner in the eve...    Elevate the head of your bed ~6" on blocks  Call for any questions or if we can be of service in any way.Marland KitchenMarland Kitchen

## 2018-04-17 NOTE — Progress Notes (Signed)
Subjective:    Patient ID: Paul Shannon, male    DOB: 1966-07-24, 52 y.o.   MRN: [id number]  HPI 52 y/o WM here for a follow up visit & CPX... he has multiple medical problems as noted below...  ~  SEE PREV EPIC NOTES FOR OLDER DATA >>    ~  Hospitalized 04/2011 w/ acute diverticulitis, treated w/ Cipro & Flagyl & slowly improved; CT Abd revealed acute diverticulitis in prox sigmoid w/ perforation & pericolonic inflamm, and fatty liver disease; [facility] consult by DrCornett w/ rec for med rx & no surg intervention...  ~  [id number]:  referred to [facility] for Cholecystectomy & intraoperative liver biopsy to further investigate the status of his liver disease (recall hx of father passing away w/ cirrhosis due to this condition) as his last bx was in the 2's;  He did well w/ surgery & had neg intraop cholangiogram, & an uneventful recovery;  Liver Bx however showed progressive HepSteatosis- 70% steatosis & total NASH score of 8 (out of poss 9);    LABS 5/13 in hosp:  Elev LFTs as noted;  CBC- ok   LABS 7/13:  FLP- ok on diet alone;  Chems- ok x persist incr LFTs due to steatosis;  TSH= 0.19 on Synth200 & we decided to slowly wean... ~  January 21, 2013:  s/p Lap Band surg for his Hepatic Steatosis & pos FamHx cirrhosis from this condition (his insurance wouldn't cover a gastric procedure); he has already lost over 30# down to 202# today & he says "I never would have known how bad I really felt til I could compare to how good I feel now" and he states he is now eating just 10% of what he ate before...  LABS 2/14:  FLP- at goals on diet alone;  Chems- wnl & LFTs now normal;  CBC- wnl;  TSH=112 & repeat=66 on Levothy253mg/d > asked to take it every AM, on empty stomach, don't eat for 1H, etc... ~  February 12, 2014:  Paul Shannon's weight was down to 175# after his LapBand surg, but he gained up to 199# last yr & now at 194# and his LFTs remain wnl... He has a BTeaching laboratory technician(Ronalee Belts w/ elevLFTs & steatosis; and another Bro w/  [place]...    LABS 3/15:  FLP- at goals on diet alone;  Chems- wnl;  CBC- wnl;  TSH=0.16...   ~  April 07, 2015:  127moOV & CPX> FrWillieseports doing well, no new complaints or concerns; his weight is stable, BMI~28, appetite good, etc; he has to chew hard foods like steaks very well to keep them from getting "hung-up" & if this happens he can drink water & then vomit it all back up (occurs every 4-62m562mopending on what he eats)...  He had right foot drop after the lap band surg- [facility]; he reports that MRI was neg but NCV was abn & surg on lateral right leg near fibular head relieved pressure on the nerve & cured the problem... We reviewed the following medical problems during today's office visit >>     HBP> prev on ASA81 (he stopped), & no BP meds; BP= 128/76 & he denies CP, palpit, SOB, edema; we discussed that he might have to restart BP meds if wt increases...    Lipids> on diet alone; wt is down as noted after LapBand surg & stable ~200#; FLP 4/16 shows TChol 146, TG 95, HDL 50, LDL  77    Hypothyroid> on Levothyroid 169mg daily; hx of suspect med compliance in past; clinically & biochem euthyroid but TSH 4/16 = 52, he swears he's taking it Qam on empty stomach & npo for 390m; we will refer to Endocrine to figure this out    Overweight> [facility] by [facility] history & he est that he is eating 10% of what he ate before; wt is down ~40# from his peak...    GI-GERD, Divertics, IBS, Hems, s/p GB> on AnusolHC cream as needed; not requiring PPI rx etc; he denies abd pain, n/v, c/d, blood seen...    Fatty Liver Dis> DrNewman mentioned that liver looked some better at the time of his surg; LFTs much improved & now remain WNL after lap band & wt loss...    DJD, right knee discomfort> hx right knee arthroscopy 10/11 by DrWainer; stable & he avoids NSAIDs... He had right foot drop after lap band surg &  required NS by DrDJones to move nerve near fibular head w/ complete resolution...    Anxiety> prev on Alpraz0.5 prn but he has been off all meds for some time- doing well w/o anxiety symptoms... We reviewed prob list, meds, xrays and labs> see below for updates >>   CXR 4/16 showed norm heart size, clear lungs, NAD...Marland KitchenMarland Kitchen EKG 4/16 showed SBrady, rate56, LAD, IRBBB, otherw NAD...  LABS 4/16:  FLP- at goals on diet alone;  Chems- wnl w/ norm LFTs;  CBC- wnl;  TSH=52 on Levothy175!  PSA=0.95... PLAN>>  He continues to do well s/p lap band wt reduction surg & LFTs have remained wnl;  The volitility in his TFTs are unexplained- prev on Levothy200 his TSH was 0.16, decreased to 17546md and TSH=52!  He states taking it every day 1st thing in the AM & NPO for 68m60mfter dosing- we will refer to Endocrine to sort this out...  ~  Apr 11, 2016:  Yearly ROV & CPX>  Paul Shannon feeling well, no new complaints or concerns; he is maintaining his weight- now at 187# on his diet w/ lap band in place; he denies CP, palpit, dizzy, SOB, edema; he had a URI 02/2016 treated by TP w/ Levaquin, Pred, Mucinex & resolved;  He also reports left knee arthritis w/ fluid tapped DrWainer, on Mobic 15mg35m & he continues to follow (we do not have notes from Ortho)... we reviewed the following medical problems during today's office visit >>     HBP> on ASA81; no BP meds; BP= 142/88 & he denies CP, palpit, SOB, edema; we discussed that he might have to restart BP meds if wt increases...    Lipids> on diet alone; wt is down as noted after LapBand surg & stable <200#; FLP [place] shows TChol 139, TG 99, HDL 41, LDL 78... rec to continue diet/ exercise...    Hypothyroid> on Levothyroid 200mcg59mking 6.5 tabs/wk); last yr on 175mcg/64ms TSH was 52 & we27eviewed taking med 1st thing in AM etc=> sent him to endocrine & he saw DrKumar 08/2015- noted inconsistent TSH readings, he noted pt was taking a calcium supplement at the same time &  switched this to evening, & his TSH stabilized (all<1.00 w/ norm FreeT4)... LAB 03/2016 showed TSH=0.15, FreeT4=1.34...    Overweight> he [facility] much better by his history & he est that he is eating 10% of what he ate before;  wt is down ~40# from his peak & holding <200# w/ normal LFTs...    GI-GERD, Divertics, IBS, Hems, s/p GB> on AnusolHC cream as needed; not requiring PPI rx etc; he denies abd pain, n/v, c/d, blood seen...    Fatty Liver Dis> DrNewman mentioned that liver looked some better at the time of his surg; LFTs remain WNL after lap band & wt loss; recall +FamHx Steatosis & father died from cirrhosis, pt's bro refused lap band surg...    DJD, right knee discomfort> hx right knee arthroscopy 10/11 by DrWainer; stable & he avoids NSAIDs... He had right foot drop after lap band surg & required NS by DrDJones to move nerve near fibular head w/ complete resolution;  He had knee shot by DrWainer for joint effusion 2017.    Anxiety> prev on Alpraz0.5 prn but he has been off all meds for some time- doing well w/o anxiety symptoms... 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  LABS 04/14/16>  FLP- at goals on diet alone;  Chems- wnl w/ norm LFTs;  CBC- wnl;  PSA=0.94;  Last TSH was 03/2016 by DrKumar- TSH=0.15, FreeT4=1.34 IMP/PLAN>>  Paul Shannon is stable overall- issues of NASH resolved after lap band surg & signif wt reduction; Thyroid improved after moving his calcium supplement to PM; DrWainer manages his arthritis- left knee arthrocentesis and Mobic rx; Labs look good...   ~  Apr 11, 2017:  36yrROV & medical follow up visit>  Paul Shannon a good yr overall-- he's had 2 issues to discuss:   1) arthritis left knee, s/o another arthroscopy in 2017 by drWainer (no notes avail to review), he is rec to use a brace w/ physical activity 7 use advil vs Aleve for pain & swelling, he will stay in touch w/ Ortho... 2) new prob- small lesion outbreak on right wrist area laterally, small blister- ?shingles eruption? & asked to watch for other lesions in C6-7 ditribution, we will Rx w/ Famvir500Tis x7d & Medrol Dosepak... We reviewed the following medical problems during today's office visit >>     Borderline HBP> on ASA81; no BP meds; BP= 122/70 after wt loss & he denies CP, palpit, SOB, edema; rec to avoid sodium 7 keep wt down!    Lipids> on diet alone (the way he likes it); wt is down as noted after LapBand surg & stable 185# today; FLP 04/2017 shows TChol 95, TG 73, HDL 42, LDL 39... WOW, fabulous, continue diet rx...    Hypothyroid> on Levothyroid 2074m (taking 6.5 tabs/wk); in 2016 on 17574md his TSH was 52 & we reviewed taking med 1st thing in AM etc=> sent him to endocrine & he saw DrKumar 08/2015- noted inconsistent TSH readings, he noted pt was taking a calcium supplement at the same time & switched this to evening, & his TSH stabilized (all<1.00 w/ norm FreeT4)... LAB 03/2016 showed TSH=0.15, FreeT4=1.34; LAB 4/18 showed TSH=0.05 & needs to decr.    Overweight> he had Lap Band surg [facility]12 [facility] much better by his history; wt is down ~40# from his peak & holding <200# w/ normal LFTs...    GI-GERD, Divertics, IBS, Hems, s/p GB> on AnusolHC cream as needed; not requiring  PPI rx etc; he denies abd pain, n/v, c/d, blood seen...    Fatty Liver Dis> DrNewman mentioned that liver looked some better at the time of his surg; LFTs remain WNL after lap band & wt loss; recall +FamHx Steatosis & father died from cirrhosis, pt's bro refused lap band surg...    DJD, bilat knee pain> hx right knee arthroscopy 2011 & left knee arthroscopy 2017 by DrWainer; stable & he avoids NSAIDs... He had right  foot drop after lap band surg & required NS by DrDJones to move nerve near fibular head w/ complete resolution;  We discussed knee brace, Advil vs aleve prn & stay in contact w/ Ortho...    Anxiety> prev on Alpraz0.5 prn but he has been off all meds for some time- doing well w/o anxiety symptoms... 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  LABS 04/08/17>  FLP- WOW all parameters are WNL;  Chems- wnl w/ K=3.6, BS=91, Cr=0.88, LFTs wnl;  CBC- wnl w/ Hg=16.4;  TSH=0.05 on Synthroid200 & he is decr to 150;  PSA=1.41... IMP/PLAN>>  Keishon is stable overall, great job w/ wt loss & resolved NAFLD- has prob shingles right writ (C6-7) & we will Rx w/ Famvir, Medrol;  TSH is oversuppressed now that he is taking it properly & no interference w/ absorption- rec to decr to 169mg/d w/ f/u labs;  His CC is his knee arthritis, managed by drWainer, Ortho- s/p bilat arthroscopic procedures and steroid injections, pt will try brace, use OTC NSAIDs as needed & stay in touch w/ Orthop...   ~  February 19, 2018:  166moOV & medical follow up>  FrHassenas seen by TP on 01/25/18 for an acute visit c/o cough; non-smoker w/o hx pulm problems, he [facility] to [facility] steatosis and a FamHx of same where his father died from cirrhosis due to this same underlying condition; Paul Shannon last 7040bs w/ resolution of LFT abn & improved liver morphology;  C/o hx severe bronchitis first of the year, given ZPak w/o much change, given Pred taper & Tussionex w/ improvement; now 1wk hx incr cough & it's worse qhs keeping him awake; denies f/c/s, discolored mucus or hemoptysis;  CXR was clear- NAD;  Rx w/ Pred/ Mucinex/ Tessalon, Zantac150Bid...     He  ret & saw DrAlva 02/09/18>  Chr cough since [id number] is dry & worse Qhs, unable to sleep & using recliner, he has vomited clear water, notes a lot of gas; Changed to Protonix40bid & UGIS ordered (see below), given Phen w/ codeine... 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  CXR 01/25/18 showed norm hear size, clear lungs- NAD  LABS 01/25/18>  Chems- wnl w/ BS=85, Cr=1.01;  BNP=17;  CBC- wnl w/ Hg=16  Ba swallow. Esophagram 02/15/18 showed diffuse esoph dilatation w/ poor persstalsis, only a sm amt of barium trickled past the lap band  w/ signif retention of barium in the esoph even in the upright position IMP/PLAN>>  It is apparent that the prob is Paul Shannon's lap-band, it has become way too tight & needs saline removed ASAP w/ f/u by CCS- DrNewman; I called DrNewman & they will see hime this afternoon 200PM...   ~  Apr 17, 2018:  77moROV & after our last visit FRobertleewent to CCS & the PA removed ~2cc of saline from the Lap-band but symptoms persisted; [facility] & pt reports that he is starting to improve although he notes food is still slow to go thru=> CCS plans to leave the saline out of the band for now esp since the esoph was diffusely dilated on the Barium esophagram 02/17/18 (it is hoped that this "band vacation" will allow the esophageal atony to resolve & the esoph to recover)- he may need EGD & has f/u appt w/ DrNewman sched for July...     EXAM shows Afeb, VSS- BP=140/88, O2sat=97% on RA at rest; Wt=172#, 5'10"Tall, BMI=25; HEENT- neg, mallampati2;  Chest- clear w/o w/r/r;  Heart- RR w/o m/r/g;  Abd- soft, nontender, s/p lap band;  Ext- neg w/o c/c/e;  Neuro-  w/o focal deficits, prev surg (NS- DrJones) near right lat fibular head relieved a neuropathy w/ prev foot drop...  LABS 04/23/18>  Pt ret for FASTING bood work>  FLP- great on diet alone w/ TChol 101, TG 67, HDL 41, LDL 47;  Chems- wnl w/ BS=84, Cr=0.89, LFTs wnl;  CBC- wnl w/ Hg=16.4;  TSH=0.16 on Levothy150/d;  PSA=1.15 IMP/PLAN>>  He is starting to improve w/ the saline removed from the band, DrNewman plans ~6wks for esoph atony to recover, continue Protonix40, then recheck pt & determine if EGD vs Ba swallow warranted for follow up eval; pt reminded to NOT eat or drinkn after dinner & sleep w/ HOB elevated;  His TSH is over-suppressed on the Levothy150 & we discussed decr to 1263m/d...          Problem List:  HYPERTENSION (ICD-401.9) - controlled on HYZAAR 100-25 daily... BP=116/76 today, & he states OK at home ~130's to 140/ 80's-90... denies HA, fatigue, visual changes, CP, palipit, dizziness, syncope, dyspnea, edema, etc...  ~  CXR 4/11 showed low lung vol's but clear, normal Cor, NAD. ~  EKG showed NSR rate 60, no signif STTWA's, WNL. ~  CXR 5/13 showed normal heart size, clear lungs, NAD... ~  2/14: on ASA81, Hyzaar100-25, K20Bid; BP= 108/70 assoc w/ his wt reduction of 36# total so far; he denies CP, palpit, SOB, edema; we decided to decr the Hyzaar to 1/2 tab daily & cut the K20 to one daily... ~  3/15: on ASA81, & off all BP meds; BP= 136/88 & he denies CP, palpit, SOB, edema; we discussed that he might have to restart BP meds if wt increases... ~  4/16: prev on ASA81 (he stopped), & no BP meds; BP= 128/76 & he denies CP, palpit, SOB, edema; we discussed that he might have to restart BP meds if wt increases...  HYPERCHOLESTEROLEMIA (ICD-272.0) - he has refused meds preferring to attempt control on diet alone... he was intol to Niacin in past w/ flushing... intol to Crestor10 when tried in Sep09... ~  FLLaurens/08 showed TChol 143, TG 190, HDL 30, LDL 75... rec to start fibrate- he declined  meds. ~  FLBorden/09 showed TChol 143, TG 123, HDL 20, LDL 99... rec to  start Crestor29m/d- he agrees. ~  pt states that he didn't tolerate the Crestor10 either, therefore stopped it. ~  FLP 4/11 showed TChol 130, TG 94, HDL 33, LDL 79... much improved, on diet alone. ~  FLP 6/12 on diet alone (after divertic episode) showed TChol 131, TG 112, HDL 30, LDL 78... rec incr exercise. ~  FLP 7/13 on diet alone showed TChol 123, TG 123, HDL 34, LDL 65 ~  FLP 2/14 on diet alone w/ 36# wt reduction so far showed TChol 135, TG 55, HDL 55, LDL 69 ~  FLP 3/15 on diet alone showed TChol 101, TG 52, HDL 37, LDL 54  HYPOTHYROIDISM (ICD-244.9) - on SYNTHROID 2017m/d w/ ?compliance issues...  ~  labs 3/08 showed TSH=20.5 on Synthroid 17537md and dose increased to 200. ~  labs 9/09 showed TSH= 6.65 on 200m6m... continue same med. ~  ran out of meds Feb10 and didn't refill- labs 3/10 showed TSH= 93... rec> restart LEVOTHY200. ~  labs 4/11 showed TSH= 0.61... clinically euthyroid, may need to cut back dose. ~  Labs 6/12 showed TSH= 1.09 ~  Labs 7/13 on Synthroid200 showed TSH= 0.19 & we decided to decr the Synthroid to 175mc23m.. ~  Labs 2/14 on Synthroid200 showed TSH=112, repeat=66; we discussed taking med every day, 1st thing in the AM on empty stomach, don't eat for 1h; I discussed this by phone w/ drEllison & he concurred that med compliace was the most likely explanation... ~  Labs 3/15 on Synthroid200 showed TSH=0.16 and we decided to decr dose to 175mcg66m  Labs 4/16 on Synthroid175 showed TSH=52!  ?what is going on? He assures me that he takes it every day, 1st thing in the AM & npo for 30min 19mr dosing... We will refer to ENDOCRINE for their review. ~  Endocrine consult, DrKumar established that he was taking the AM Synthroid with a calcium supplement; they switched the supplement to the evening & absorption improved, TSH was over-suppressed & dose slowly decreased.  OVERWEIGHT (ICD-278.02) - since  we first met Zak in 1997 his weight has been 220-230 range... occas down to 210-215, and once down to 205# in 2001... highest weight has been 232#... ~  wMarland Kitchenight 3/10 = 230# ~  weight 4/11 = 228# ~  Weight 6/12 = 221#... This was after his diverticulitis episode. ~  Weight 7/13 = 235# ~  Weight 2/14 = 202#... s/p Lap Band surg 1/14 for his steatohepatitis ~  Weight 2/14 = 199# ~  Weight 3/15 = 194# ~  Weight 4/16 = 198# ~  Weight 5/17 = 187# ~  Weight 5/18 = 185# ~  Weight 3/19 = 172#... C/o intermittent N/V, undigested food, Lap-band too tight w/ dilated esoph & saline removed from the band by DrNewmaMed Atlantic Incight 5/19 = 181#  GERD (ICD-530.81) - uses OTC Prilosec as needed. ~  EGD 3/13 by DrKaplan showed a benign nodule in the distal esoph, otherw neg...  DIVERTICULITIS>  Adm to hosp 5/12 w/ acute sigmoid diverticulitis;  treated w/ Cipro & Flagyl & slowly improved; CT Abd revealed acute diverticulitis in prox sigmoid w/ perforation & pericolonic inflamm, and fatty liver disease; [facility] consult by DrCornett w/ rec for med rx & no surg intervention; he has a post hosp follow up appt w/ DrKaplan ==> pending 6/12 ~  Colonoscopy 6/12 by DrKaplan showed mod divertics in the sigmoid, otherw neg & f/u planned 44yrs..102yrRITABLE BOWEL SYNDROME (ICD-564.1) & HEMORRHOIDS (ICD-455.6) - he had a  neg FlexSig in 1998... he uses OTC PrepH.  FATTY LIVER DISEASE (ICD-571.8) -  he has hepatic steatosis w/ prev GI eval 1999 by [facility] including a liver Bx... there is a pos family hx of cirrhosis in his father who died from this disease... he has been counselled on weight reduction and told to avoid hepatotoxins including alcohol...  he has discussed Hepatic Steatosis/ Fatty Liver Disease w/ GI, and we have provided him w/ mult hand-out about this condition...  he understands that it can lead to cirrhosis and death from liver failure... he understands that he must get his weight down, control his fat intake, and  follow the FLP & LFT's carefully... ~  LFTs 3/08 showed SGOT= 61, SGPT= 100... rec> get wt down. ~  LFTs 9/09 showed SGOT= 43, SGPT= 62... improved. ~  LFTs 2/10 showed SGOT= 29, SGPT= 39... normal! ~  LFTs 4/11 showed SGOT= 60, SGPT= 97... must diet & get wt down! 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 ~  LFTs 6/13 showed SGOT= 121, SGPT= 164... Pt referred back to GI for his severe hepatic steatosis... ~  LFTs 1/14 s/p Lap Band surg for his steatohepatitis showed SGOT=47, SGPT=72...  ~  LFTs 2/14 (wt=199#) showed SGOT=38, SGPT=34; much improved... ~  LFTs have remained WNL since his lap-band surg & weight loss to <200#...  Small lesion in the skin on the underside of the scrotum >> presented 2/14 w/ this superfic infection; treated w/ Sitz, Neosporin, SeptraDSBid... ~  2/14: pt reports much better & lesion resolved on Rx...  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 Hx right foot drop after lap-band surg w/ eval by DrWainer & NS-DrDJones w/ neg MRI back but abn NCVs; ultimately had surg on the nerve right lat knee/fibular head & all his symptoms resolved...  ANXIETY (ICD-300.00) - uses Alprazolam Prn...  DERM - mult tattoos...  Health Maintenance -  ~  GU:  age 67, PSA checked 2/10 due to symptoms and normal at 0.85... ~  GI:  had FlexSig in 1998 for IBS symptoms by [facility] & it was neg... GI f/u pending w/ DrKaplan s/p diverticulitis. ~  Immunizations:  ? last Tetanus shot (he agrees to TDAP today)... hasn't had Pneumovax, Flu shots (refuses), or HepB series...  << NOTE: GIVEN COMBINED HEP A & HEP B VACCINE BY [facility] 7-8/13 >>   Past Surgical History:  Procedure Laterality Date  . BREATH TEK H PYLORI  10/02/2012   Procedure: [place];  Surgeon: Shann Medal, MD;  Location: [facility];  Service: General;  Laterality: N/A;  . CHOLECYSTECTOMY  05/11/2012  . COLONOSCOPY    . KNEE SURGERY  2011   right knee   . LAPAROSCOPIC GASTRIC BANDING  12/25/2012   Procedure: LAPAROSCOPIC GASTRIC BANDING;  Surgeon: Shann Medal, MD;  Location: [facility]S;  Service: General;  Laterality: N/A;  Lap Band Placement  . LIVER BIOPSY    . LIVER BIOPSY  12/25/2012   Procedure: LIVER BIOPSY;  Surgeon: Shann Medal, MD;  Location: [facility] ORS;  Service: General;;  . MESH APPLIED TO LAP PORT  12/25/2012   Procedure: MESH APPLIED TO LAP PORT;  Surgeon: Shann Medal, MD;  Location: [facility]S;  Service: General;;  . TONSILLECTOMY AND ADENOIDECTOMY  as  child    Outpatient Encounter Medications as of 04/17/2018  Medication Sig  . ALPRAZolam (XANAX) 0.5 MG tablet Take 1/2 to 1 tablet by mouth three times daily as needed  . amLODipine (NORVASC) 5 MG tablet Take 1 tablet (5 mg total) by mouth daily.  [name] aspirin 81 MG tablet Take 81 mg by mouth daily with breakfast.   . Levothyroxine Sodium 150 MCG CAPS Take 1 capsule (150 mcg total) by mouth daily before breakfast.  . pantoprazole (PROTONIX) 40 MG tablet TAKE 1 TABLET BY MOUTH TWICE DAILY FOR TWO WEEKS, THEN 1 TABLET DAILY  . [DISCONTINUED] benzonatate (TESSALON) 200 MG capsule Take 1 capsule (200 mg total) by mouth 3 (three) times daily as needed for cough.  . [DISCONTINUED] levofloxacin (LEVAQUIN) 500 MG tablet Take 1 tablet (500 mg total) by mouth daily.  . [DISCONTINUED] promethazine-codeine (PHENERGAN WITH CODEINE) 6.25-10 MG/5ML syrup Take 5 mLs by mouth at bedtime.   No facility-administered encounter medications on file as of 04/17/2018.     Allergies  Allergen Reactions  . Niacin     REACTION: pt states INTOL w/ flushing  . Other     Red dye=rash   . Penicillins     REACTION: childhood - extreme rash  . Red Dye   . Rosuvastatin     REACTION: pt states INTOL    Immunization History  Administered Date(s) Administered  . Hep A / Hep B  06/26/2012, 07/03/2012, 07/27/2012, 07/31/2013  . Td 03/19/2010    Current Medications, Allergies, Past Medical History, Past Surgical History, Family History, and Social History were reviewed in [employer] record.   Review of Systems        The patient complains of joint pain and paresthesias.  The patient denies fever, chills, sweats, anorexia, fatigue, weakness, malaise, weight loss, sleep disorder, blurring, diplopia, eye irritation, eye discharge, vision loss, eye pain, photophobia, earache, ear discharge, tinnitus, decreased hearing, nasal congestion, nosebleeds, sore throat, hoarseness, chest pain, palpitations, syncope, dyspnea on exertion, orthopnea, PND, peripheral edema, cough, dyspnea at rest, excessive sputum, hemoptysis, wheezing, pleurisy, nausea, vomiting, diarrhea, constipation, change in bowel habits, abdominal pain, melena, hematochezia, jaundice, gas/bloating, indigestion/heartburn, dysphagia, odynophagia, dysuria, hematuria, urinary frequency, urinary hesitancy, nocturia, incontinence, back pain, joint swelling, muscle cramps, muscle weakness, stiffness, arthritis, sciatica, restless legs, leg pain at night, leg pain with exertion, rash, itching, dryness, suspicious lesions, paralysis, seizures, tremors, vertigo, transient blindness, frequent falls, frequent headaches, difficulty walking, depression, anxiety, memory loss, confusion, cold intolerance, heat intolerance, polydipsia, polyphagia, polyuria, unusual weight change, abnormal bruising, bleeding, enlarged lymph nodes, urticaria, allergic rash, hay fever, and recurrent infections.     Objective:   Physical Exam      WD, Overweight, 52 y/o WM in NAD... GENERAL:  Alert & oriented; pleasant & cooperative... HEENT:  [place]/AT, EOM-wnl, PERRLA, EACs-clear, TMs-wnl, NOSE-clear, THROAT-clear & wnl... NECK:  Supple w/ full ROM; no JVD; normal carotid impulses w/o bruits; no thyromegaly or nodules palpated; no  lymphadenopathy... CHEST:  Clear to P & A; without wheezes/ rales/ or rhonchi heard... HEART:  Regular Rhythm; without murmurs/ rubs/ or gallops detected... ABDOMEN:  Soft & min tender LLQ; normal bowel sounds; no organomegaly or masses palpated...  RECTAL:  neg- prostate 2+ normal, stool heme neg, few ext hem's noted... EXT: without deformities, mild arthritic changes;   NEURO:  CN's intact; motor testing normal; sensory testing normal; gait normal & balance OK. DERM:  No lesions noted; no rash, several tatoos...   Assessment & Plan:  02/19/18>  It is apparent that Paul Shannon prob is the lap-band which has become wat too tight; I called DrNewman at [facility] & they will see him this afternoon at 200PM to remove some saline from the band & follow him going forward...  04/17/18>   He is starting to improve w/ the saline removed from the band, DrNewman plans ~6wks for esoph atony to recover, continue Protonix40, then recheck pt & determine if EGD vs Ba swallow warranted for follow up eval; pt reminded to NOT eat or drinkn after dinner & sleep w/ HOB elevated;  His TSH is over-suppressed on the Levothy150 & we discussed decr to 142mg/d.   HBP>  Controlled off all BP meds on diet alone since he's lost the weight...  CHOL>  Improved w/ wt reduction, advised to keep up the wt reduction efforts & low fat diet...  HYPOTHYROID>  Labs have been up & down on dose of 175-206m/d, it makes no sense since he claims he's taking it every day 1st thing in the AM, & npo x 1H;  DrKumar found that he was taking calcium supplement at the same time & switching this to the eve has made all the difference...     04/11/17>  TSH=0.05 now & we will decr dose to 15068md w/ f/u labs...    04/17/18>  TSH still low at 0.16 on the 150m83m & we decided to decr further to 125mc70m..  OBESITY>  He is now [facility] ~40# & he feels much better...  GI>  GERD/ Divertics/ IBS/ Fatty Liver>   Followed by DrKapDemetra ShinerGI...  DIVERTICULITIS>  AS ABOVE- acute diverticulitis 5/12 w/ perf & pericolonic inflamm, improved w/ antibiotic Rx & f/u colon 6/12 was otherw neg...  NAFLD>  This is his most serious medical condition w/ liver bx proving marked progression over the last 10+yrs; his father died from this disease; he had bariatric surg 1/14 & has lost >40#, feeling better & LFTs improved... We will continue to follow...  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   Patient's Medications  New Prescriptions   No medications on file  Previous Medications   ALPRAZOLAM (XANAX) 0.5 MG TABLET    Take 1/2 to 1 tablet by mouth three times daily as needed   AMLODIPINE (NORVASC) 5 MG TABLET    Take 1 tablet (5 mg total) by mouth daily.   ASPIRIN 81 MG TABLET    Take 81 mg by mouth daily with breakfast.    LEVOTHYROXINE SODIUM 125 MCG CAPS    Take 1 tab (125 mcg total) by mouth daily before breakfast.   PANTOPRAZOLE (PROTONIX) 40 MG TABLET    TAKE 1 TABLET BY MOUTH TWICE DAILY FOR TWO WEEKS, THEN 1 TABLET DAILY  Modified Medications   No medications on file  Discontinued Medications   BENZONATATE (TESSALON) 200 MG CAPSULE    Take 1 capsule (200 mg total) by mouth 3 (three) times daily as needed for cough.   LEVOFLOXACIN (LEVAQUIN) 500 MG TABLET    Take 1 tablet (500 mg total) by mouth daily.   PROMETHAZINE-CODEINE (PHENERGAN WITH CODEINE) 6.25-10 MG/5ML SYRUP    Take 5 mLs by mouth at bedtime.

## 2018-04-23 ENCOUNTER — Other Ambulatory Visit (INDEPENDENT_AMBULATORY_CARE_PROVIDER_SITE_OTHER): Payer: PRIVATE HEALTH INSURANCE

## 2018-04-23 DIAGNOSIS — Z0000 Encounter for general adult medical examination without abnormal findings: Secondary | ICD-10-CM

## 2018-04-23 LAB — COMPREHENSIVE METABOLIC PANEL
ALBUMIN: 4.2 g/dL (ref 3.5–5.2)
ALT: 22 U/L (ref 0–53)
AST: 19 U/L (ref 0–37)
Alkaline Phosphatase: 66 U/L (ref 39–117)
BUN: 11 mg/dL (ref 6–23)
CALCIUM: 9.3 mg/dL (ref 8.4–10.5)
CHLORIDE: 102 meq/L (ref 96–112)
CO2: 28 meq/L (ref 19–32)
Creatinine, Ser: 0.89 mg/dL (ref 0.40–1.50)
GFR: 95.69 mL/min (ref >60.00)
Glucose, Bld: 84 mg/dL (ref 70–99)
POTASSIUM: 3.6 meq/L (ref 3.5–5.1)
Sodium: 140 mEq/L (ref 135–145)
Total Bilirubin: 1.6 mg/dL — ABNORMAL HIGH (ref 0.2–1.2)
Total Protein: 7.2 g/dL (ref 6.0–8.3)

## 2018-04-23 LAB — TSH: TSH: 0.16 u[IU]/mL — AB (ref 0.35–4.50)

## 2018-04-23 LAB — CBC WITH DIFFERENTIAL/PLATELET
Basophils Absolute: 0 10*3/uL (ref 0.0–0.1)
Basophils Relative: 0.6 % (ref 0.0–3.0)
Eosinophils Absolute: 0.2 10*3/uL (ref 0.0–0.7)
Eosinophils Relative: 3.7 % (ref 0.0–5.0)
HCT: 45.7 % (ref 39.0–52.0)
HEMOGLOBIN: 16.4 g/dL (ref 13.0–17.0)
LYMPHS PCT: 39.6 % (ref 12.0–46.0)
Lymphs Abs: 1.9 10*3/uL (ref 0.7–4.0)
MCHC: 35.9 g/dL (ref 30.0–36.0)
MCV: 89.9 fl (ref 78.0–100.0)
MONOS PCT: 12.3 % — AB (ref 3.0–12.0)
Monocytes Absolute: 0.6 10*3/uL (ref 0.1–1.0)
Neutro Abs: 2.1 10*3/uL (ref 1.4–7.7)
Neutrophils Relative %: 43.8 % (ref 43.0–77.0)
Platelets: 243 10*3/uL (ref 150.0–400.0)
RBC: 5.08 Mil/uL (ref 4.22–5.81)
RDW: 12.4 % (ref 11.5–15.5)
WBC: 4.9 10*3/uL (ref 4.0–10.5)

## 2018-04-23 LAB — BASIC METABOLIC PANEL
BUN: 11 mg/dL (ref 6–23)
CALCIUM: 9.3 mg/dL (ref 8.4–10.5)
CHLORIDE: 102 meq/L (ref 96–112)
CO2: 28 meq/L (ref 19–32)
CREATININE: 0.89 mg/dL (ref 0.40–1.50)
GFR: 95.69 mL/min (ref >60.00)
GLUCOSE: 84 mg/dL (ref 70–99)
Potassium: 3.6 mEq/L (ref 3.5–5.1)
Sodium: 140 mEq/L (ref 135–145)

## 2018-04-23 LAB — PSA: PSA: 1.15 ng/mL (ref 0.10–4.00)

## 2018-04-23 LAB — LIPID PANEL
CHOL/HDL RATIO: 2
CHOLESTEROL: 101 mg/dL (ref 0–200)
HDL: 40.9 mg/dL (ref >39.00)
LDL CALC: 47 mg/dL (ref 0–99)
NonHDL: 60.48
TRIGLYCERIDES: 67 mg/dL (ref 0.0–149.0)
VLDL: 13.4 mg/dL (ref 0.0–40.0)

## 2018-04-27 ENCOUNTER — Other Ambulatory Visit: Payer: Self-pay

## 2018-04-27 MED ORDER — LEVOTHYROXINE SODIUM 125 MCG PO TABS: 125.0000 ug | ORAL_TABLET | Freq: Every day | ORAL | 3 refills | Status: DC

## 2018-06-25 ENCOUNTER — Other Ambulatory Visit: Payer: PRIVATE HEALTH INSURANCE

## 2018-06-25 ENCOUNTER — Other Ambulatory Visit: Payer: Self-pay | Admitting: Surgery

## 2018-06-25 DIAGNOSIS — Z9884 Bariatric surgery status: Secondary | ICD-10-CM

## 2018-06-27 ENCOUNTER — Other Ambulatory Visit: Payer: PRIVATE HEALTH INSURANCE

## 2018-06-27 ENCOUNTER — Ambulatory Visit
Admission: RE | Admit: 2018-06-27 | Discharge: 2018-06-27 | Disposition: A | Discharge status: Home / Self Care | Payer: PRIVATE HEALTH INSURANCE | Source: Ambulatory Visit | Attending: Surgery | Admitting: Surgery

## 2018-06-27 DIAGNOSIS — Z9884 Bariatric surgery status: Secondary | ICD-10-CM

## 2018-07-11 ENCOUNTER — Other Ambulatory Visit: Payer: Self-pay | Admitting: Pulmonary Disease

## 2018-07-13 ENCOUNTER — Encounter (HOSPITAL_COMMUNITY): Payer: Self-pay | Admitting: *Deleted

## 2018-07-19 ENCOUNTER — Ambulatory Visit: Payer: Self-pay | Admitting: Surgery

## 2018-07-24 ENCOUNTER — Telehealth: Payer: Self-pay | Admitting: Pulmonary Disease

## 2018-07-24 MED ORDER — CIPROFLOXACIN HCL 500 MG PO TABS: 500.0000 mg | ORAL_TABLET | Freq: Two times a day (BID) | ORAL | 0 refills | Status: DC

## 2018-07-24 NOTE — Telephone Encounter (Signed)
Per SN- Cipro 500mg , #14, rake 1 BID, clear liquids, and if no better he needs a OV. Called and gave recommendations to Patient.  Patient stated understanding.  Prescription sent to preferred pharmacy.  Nothing further needed at this time.

## 2018-07-24 NOTE — Telephone Encounter (Signed)
Called pt but unable to reach him.  Left message for pt to return call x1.

## 2018-07-24 NOTE — Telephone Encounter (Signed)
Patient returning call - he can be reached at [phone] -pr

## 2018-07-24 NOTE — Telephone Encounter (Signed)
Spoke with pt, states he was in [place] last week, came home and started getting sick X1 day after returning home from travels.  C/o increased fatigue, chills, low grade temp, headache, stomach ache, diarrhea.    Notes that he drinks a protein shake daily with ground nuts in it, is concerned that he is feeling like he did when dx'ed with diverticulitis.    S/s present X3 days.  Pt has picked up a probiotic but has not started it yet.  Requesting SN's recs.    Pharmacy: [facility] on [address] in [place].   SN please advise on recs.  Thanks!

## 2018-07-26 ENCOUNTER — Other Ambulatory Visit (HOSPITAL_COMMUNITY): Payer: Self-pay | Admitting: *Deleted

## 2018-07-26 NOTE — Patient Instructions (Addendum)
Paul Shannon  07/26/2018   Your procedure is scheduled on: 08-02-18  Report to [facility] Main  Entrance  Report to admitting at 530 AM    Call this number if you have problems the morning of surgery [phone]   Remember: Do not eat food or drink liquids :After Midnight.     Take these medicines the morning of surgery with A SIP OF WATER: AMLODIPINE (NORVASC), LEVOTHYROXINE (SYNTHROID)                               You may not have any metal on your body including hair pins and              piercings  Do not wear jewelry, make-up, lotions, powders or perfumes, deodorant             Do not wear nail polish.  Do not shave  48 hours prior to surgery.              Men may shave face and neck.   Do not bring valuables to the hospital. [place].  Contacts, dentures or bridgework may not be worn into surgery.  Leave suitcase in the car. After surgery it may be brought to your room.     Patients discharged the day of surgery will not be allowed to drive home.  Name and phone number of your driver: wife Paul Shannon cell [phone]  Special Instructions: N/A              Please read over the following fact sheets you were given: _____________________________________________________________________             [facility] - Preparing for Surgery Before surgery, you can play an important role.  Because skin is not sterile, your skin needs to be as free of germs as possible.  You can reduce the number of germs on your skin by washing with CHG (chlorahexidine gluconate) soap before surgery.  CHG is an antiseptic cleaner which kills germs and bonds with the skin to continue killing germs even after washing. Please DO NOT use if you have an allergy to CHG or antibacterial soaps.  If your skin becomes reddened/irritated stop using the CHG and inform your nurse when you arrive at Short Stay. Do not shave (including legs  and underarms) for at least 48 hours prior to the first CHG shower.  You may shave your face/neck. Please follow these instructions carefully:  1.  Shower with CHG Soap the night before surgery and the  morning of Surgery.  2.  If you choose to wash your hair, wash your hair first as usual with your  normal  shampoo.  3.  After you shampoo, rinse your hair and body thoroughly to remove the  shampoo.                           4.  Use CHG as you would any other liquid soap.  You can apply chg directly  to the skin and wash                       Gently with a scrungie or clean washcloth.  5.  Apply the CHG Soap to your body ONLY FROM THE NECK DOWN.   Do not use on face/ open                           Wound or open sores. Avoid contact with eyes, ears mouth and genitals (private parts).                       Wash face,  Genitals (private parts) with your normal soap.             6.  Wash thoroughly, paying special attention to the area where your surgery  will be performed.  7.  Thoroughly rinse your body with warm water from the neck down.  8.  DO NOT shower/wash with your normal soap after using and rinsing off  the CHG Soap.                9.  Pat yourself dry with a clean towel.            10.  Wear clean pajamas.            11.  Place clean sheets on your bed the night of your first shower and do not  sleep with pets. Day of Surgery : Do not apply any lotions/deodorants the morning of surgery.  Please wear clean clothes to the hospital/surgery center.  FAILURE TO FOLLOW THESE INSTRUCTIONS MAY RESULT IN THE CANCELLATION OF YOUR SURGERY PATIENT SIGNATURE_________________________________  NURSE SIGNATURE__________________________________  ________________________________________________________________________

## 2018-07-30 ENCOUNTER — Encounter (HOSPITAL_COMMUNITY)
Admission: RE | Admit: 2018-07-30 | Discharge: 2018-07-30 | Disposition: A | Discharge status: Home / Self Care | Payer: No Typology Code available for payment source | Source: Ambulatory Visit | Attending: Surgery | Admitting: Surgery

## 2018-07-30 ENCOUNTER — Other Ambulatory Visit: Payer: Self-pay

## 2018-07-30 ENCOUNTER — Telehealth: Payer: Self-pay | Admitting: Pulmonary Disease

## 2018-07-30 ENCOUNTER — Encounter (HOSPITAL_COMMUNITY): Payer: Self-pay

## 2018-07-30 DIAGNOSIS — K2950 Unspecified chronic gastritis without bleeding: Secondary | ICD-10-CM | POA: Diagnosis not present

## 2018-07-30 DIAGNOSIS — Z79890 Hormone replacement therapy (postmenopausal): Secondary | ICD-10-CM | POA: Diagnosis not present

## 2018-07-30 DIAGNOSIS — Z888 Allergy status to other drugs, medicaments and biological substances status: Secondary | ICD-10-CM | POA: Diagnosis not present

## 2018-07-30 DIAGNOSIS — T18128A Food in esophagus causing other injury, initial encounter: Secondary | ICD-10-CM | POA: Diagnosis not present

## 2018-07-30 DIAGNOSIS — Z87442 Personal history of urinary calculi: Secondary | ICD-10-CM | POA: Diagnosis not present

## 2018-07-30 DIAGNOSIS — Z9102 Food additives allergy status: Secondary | ICD-10-CM | POA: Diagnosis not present

## 2018-07-30 DIAGNOSIS — X58XXXA Exposure to other specified factors, initial encounter: Secondary | ICD-10-CM | POA: Diagnosis not present

## 2018-07-30 DIAGNOSIS — Z79899 Other long term (current) drug therapy: Secondary | ICD-10-CM | POA: Diagnosis not present

## 2018-07-30 DIAGNOSIS — Z880 Allergy status to penicillin: Secondary | ICD-10-CM | POA: Diagnosis not present

## 2018-07-30 DIAGNOSIS — R1110 Vomiting, unspecified: Secondary | ICD-10-CM | POA: Diagnosis present

## 2018-07-30 DIAGNOSIS — Z6832 Body mass index (BMI) 32.0-32.9, adult: Secondary | ICD-10-CM | POA: Diagnosis not present

## 2018-07-30 DIAGNOSIS — Z9884 Bariatric surgery status: Secondary | ICD-10-CM | POA: Diagnosis not present

## 2018-07-30 DIAGNOSIS — Z7982 Long term (current) use of aspirin: Secondary | ICD-10-CM | POA: Diagnosis not present

## 2018-07-30 DIAGNOSIS — I10 Essential (primary) hypertension: Secondary | ICD-10-CM | POA: Diagnosis not present

## 2018-07-30 HISTORY — DX: Adverse effect of unspecified anesthetic, initial encounter: T41.45XA

## 2018-07-30 HISTORY — DX: Disease of esophagus, unspecified: K22.9

## 2018-07-30 HISTORY — DX: Other complications of anesthesia, initial encounter: T88.59XA

## 2018-07-30 HISTORY — DX: Nausea with vomiting, unspecified: R11.2

## 2018-07-30 LAB — HEPATIC FUNCTION PANEL
ALT: 26 (ref 10–40)
AST: 26 (ref 14–40)
Alkaline Phosphatase: 77 (ref 25–125)
Bilirubin, Total: 1

## 2018-07-30 LAB — BASIC METABOLIC PANEL
BUN: 12 (ref 4–21)
Creatinine: 1 (ref 0.6–1.3)
Glucose: 85
Potassium: 4.2 (ref 3.4–5.3)
Sodium: 139 (ref 137–147)

## 2018-07-30 LAB — COMPREHENSIVE METABOLIC PANEL
ALT: 26 U/L (ref 0–44)
AST: 26 U/L (ref 15–41)
Albumin: 4.5 g/dL (ref 3.5–5.0)
Alkaline Phosphatase: 77 U/L (ref 38–126)
Anion gap: 7 (ref 5–15)
BILIRUBIN TOTAL: 1 mg/dL (ref 0.3–1.2)
BUN: 12 mg/dL (ref 6–20)
CHLORIDE: 102 mmol/L (ref 98–111)
CO2: 30 mmol/L (ref 22–32)
Calcium: 9.6 mg/dL (ref 8.9–10.3)
Creatinine, Ser: 1.04 mg/dL (ref 0.61–1.24)
Glucose, Bld: 85 mg/dL (ref 70–99)
Potassium: 4.2 mmol/L (ref 3.5–5.1)
Sodium: 139 mmol/L (ref 135–145)
TOTAL PROTEIN: 7.7 g/dL (ref 6.5–8.1)

## 2018-07-30 LAB — CBC
HCT: 43.6 % (ref 39.0–52.0)
Hemoglobin: 15.9 g/dL (ref 13.0–17.0)
MCH: 31.4 pg (ref 26.0–34.0)
MCHC: 36.5 g/dL — ABNORMAL HIGH (ref 30.0–36.0)
MCV: 86 fL (ref 78.0–100.0)
PLATELETS: 290 10*3/uL (ref 150–400)
RBC: 5.07 MIL/uL (ref 4.22–5.81)
RDW: 12.9 % (ref 11.5–15.5)
WBC: 5.7 10*3/uL (ref 4.0–10.5)

## 2018-07-30 LAB — SURGICAL PCR SCREEN
MRSA, PCR: NEGATIVE
STAPHYLOCOCCUS AUREUS: NEGATIVE

## 2018-07-30 NOTE — Progress Notes (Signed)
SPOKE WITH SYLVIA AT [facility] PATIENT IS NPO AFTER MIDNIGHT, NO ERAS PROTOCOL OR DRINK [PER DR NEWMAN.

## 2018-07-30 NOTE — Progress Notes (Signed)
Spoke with Paul Shannon at [facility] triage. Paul Shannon will follow up does dr Lucia Gaskins want eras protocol that is posted on surgery schedule with clear liquids until 3 hours prior to surgery and pre surgery shake since no order in epic for pre surgery shake.

## 2018-07-30 NOTE — Progress Notes (Signed)
Spoke with  erica at dr Lenna Gilford office, she will send note to office nurse requesting 04-23-18 done at dr nadel ekg to be faxed to [phone] fax.

## 2018-07-30 NOTE — Telephone Encounter (Signed)
Spoke with Ivin Booty at [facility] her that we do not have a recent EKG for the pt. She verbalized understanding and will make sure the pt has one the day of his surgery.

## 2018-07-30 NOTE — Progress Notes (Signed)
   07/30/18 0915  OBSTRUCTIVE SLEEP APNEA  Have you ever been diagnosed with sleep apnea through a sleep study? No  Do you snore loudly (loud enough to be heard through closed doors)?  1  Do you often feel tired, fatigued, or sleepy during the daytime (such as falling asleep during driving or talking to someone)? 0  Has anyone observed you stop breathing during your sleep? 1  Do you have, or are you being treated for high blood pressure? 1  BMI more than 35 kg/m2? 0  Age > 50 (1-yes) 1  Neck circumference greater than:Male 16 inches or larger, Male 17inches or larger? 0  Male Gender (Yes=1) 1  Obstructive Sleep Apnea Score 5

## 2018-07-31 ENCOUNTER — Other Ambulatory Visit (HOSPITAL_COMMUNITY): Payer: Self-pay | Admitting: *Deleted

## 2018-07-31 NOTE — Progress Notes (Signed)
Spoke with patient and made aware patient is to be npo after midnight and no pre surgery drink is to be taken per dr Lucia Gaskins. Made patient aware per lindsay at dr nadel's office no ekg on file since 20-16 ekg to be done day of surgery due to no ekg in epic.

## 2018-08-01 NOTE — H&P (Signed)
Tessie Eke  Location: [facility] Surgery Patient #: [id number] DOB: 1966-04-14 Married / Language: English / Race: White Male  History of Present Illness   The patient is a 52 year old male is here for LapBand followup.  The PCP is Dr. Jeannine Kitten  He comes by himself.  The patient is somewhat better. He still has night coughing about 2-3 times a week. He says that at night he is propped up more in bed. He is taking Protonix once a day. We talked about doubling the Protonx. He says it regurgitation can happen anytime, but alternates own saliva. He wonders whether he has a dairy allergy. He has modified his diet somewhat. We talked about the options from here. It sounds like his esophagus is not talo recovered. I think he would be best served with continued lap band vacation for a couple more months. If he still having symptoms, would consider repeating the upper GI or do an upper endoscopy. I told him that his esophagus made never fully recover and he may have some minor symptoms. He has gained a little weight and we talked about watching his portion control. He also talked about his sister who sound like she is undergoing a portal vein shunt.  Plan: 1) Follow up in 10 to 12 weeks  Story of lap band trouble: Mr Tomaro started having trouble with a cold and upper respiratory symptoms in November 2018. He Had increasing difficulty with reflux and night cough. He was treated both for reflux disease and given a course of antibiotics, but did not get better. He got an UGI on 02/17/2018 - 1. Abnormal barium esophagram with diffuse dilatation of the esophagus. 2. Lap band appears in good position. 3. Only a small amount of contrast passes through the lap band into the stomach in the upright, supine, and right left lateral decubitus positions. There is significant retention of contrast in the esophagus even in the upright  position. Dr. Lenna Gilford spoke to me about the patient. I was not available for several days, but he saw Puja in our office.   Past Medical History: 1. Morbid obesity - weight 228, BMI - 32.7  Lap Band, APS - 12/25/2012 2. Lap band too tight On lap band holidary from 04/05/2018 >>>> 3. Esophageal nodule - benign squamous papilloma. 4. History of diverticulitis. 5. Hypertension 6. Seasonal allergies 7. History of kidney stones. 8. Questionable right inguinal hernia. Does not really bother him. 9. On thyroid replacement. For about 10 years. TSH - 0.19 - 06/12/2012. 10. Liver with 70% steatosis changes. Biopsy done during his lap chole on 05/11/2012. He has a NASH score of 8. Significant improvement of liver disease on most recent laparoscopy/liver biopsy - 12/25/2012  11. Biliary dyskinesia - S/P lap chole - 05/11/2012 - D. Arynn Armand 12. 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 Coralie Common about 05/22/2013 He did very well from this.  Social History: Married. 2 children, ages 58 and 65. 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  His sister has liver failure, has esophageal varices, and is not a liver transplant candidate.  Allergies (Tanisha A. Owens Shark, [place]; 06/06/2018 10:58 AM) Bio-Statin *ANTIFUNGALS*  Statins Support *DIETARY PRODUCTS/DIETARY MANAGEMENT PRODUCTS*  Red Dyes  Penicillins  Allergies Reconciled   Medication History (Tanisha A. Owens Shark, [place]; 06/06/2018 10:59 AM) ALPRAZolam (0.5MG  Tablet, Oral) Active. Levothyroxine Sodium (150MCG Tablet, Oral) Active. Aspirin (81MG  Tablet Chewable, Oral) Active. Medications Reconciled  Vitals Yvetta Coder  AOwens Shark RMA; 06/06/2018 10:58 AM) 06/06/2018 10:58 AM Weight: 183.2 lb Height: 69in Body Surface Area: 1.99 m Body Mass Index: 27.05 kg/m  Temp.: 98.70F  Pulse: 86 (Regular)  BP:  132/94 (Sitting, Left Arm, Standard)  Physical Exam  General: WN WM alert and generally healthy appearing. White beard. HEENT: Normal. Pupils equal.  Neck: Supple. No mass. No thyroid mass.  Lymph Nodes: No supraclavicular or cervical nodes.  Lungs: Clear to auscultation and symmetric breath sounds. Heart: RRR. No murmur or rub.  Abdomen: Soft. No mass. No tenderness. No hernia. Normal bowel sounds.  Port in [place].   Extremities: Good strength and ROM in upper and lower extremities.   Assessment & Plan  1.  HISTORY OF LAPAROSCOPIC ADJUSTABLE GASTRIC BANDING (Z98.84)  Story: Lap band, APS - 12/25/2012 - D. Amarilys Lyles  Plan:   1) Follow up in 10 to 12 weeks  Addendum Note(Skylyn Slezak H. Xzavion Doswell MD; 06/29/2018 3:30 PM) UGI on 7/17 shows persistent narrowing at lap band with some retention of contrast above the lap band. ------------------ I spoke to the patient. He feels like his upper gastro-intestinal symptoms are getting worse. I talked to him about doing an endoscopy. He is out of the country until [place]13. We will schedule the upper endo when he gets back.  Addendum Note(Zakery Normington H. Lucia Gaskins MD; 07/25/2018 11:07 AM) Was in [place] and got GI bug with diarrhea. He was worried about diverticulitis. He called [facility] who called in Cipro. This seems appropriate. He is for an upper endo next week.  2.Lap Band, APS - 12/25/2012  weight 228, BMI - 32.7   On lap band holidary from 04/05/2018 >>>> 3. Esophageal nodule - benign squamous papilloma. 4. History of diverticulitis. 5. Hypertension 6. History of kidney stones. 7. Questionable right inguinal hernia. Does not really bother him. 8. On thyroid replacement. For about 10 years. TSH - 0.19 - 06/12/2012. 9. Liver with 70% steatosis changes. Biopsy done during his lap chole on 05/11/2012. He has a NASH score of 8. Significant improvement of liver disease on most recent  laparoscopy/liver biopsy - 12/25/2012  10. 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 Coralie Common about 05/22/2013 He did very well from this.  Alphonsa Overall, MD, [facility] Surgery Pager: [phone] Office phone:  [phone]

## 2018-08-02 ENCOUNTER — Ambulatory Visit (HOSPITAL_COMMUNITY)
Admission: RE | Admit: 2018-08-02 | Discharge: 2018-08-02 | Disposition: A | Discharge status: Home / Self Care | Payer: No Typology Code available for payment source | Source: Ambulatory Visit | Attending: Surgery | Admitting: Surgery

## 2018-08-02 ENCOUNTER — Encounter (HOSPITAL_COMMUNITY): Payer: Self-pay | Admitting: Anesthesiology

## 2018-08-02 ENCOUNTER — Other Ambulatory Visit: Payer: Self-pay

## 2018-08-02 ENCOUNTER — Ambulatory Visit: Payer: Self-pay | Admitting: Surgery

## 2018-08-02 ENCOUNTER — Encounter (HOSPITAL_COMMUNITY)
Admission: RE | Disposition: A | Discharge status: Home / Self Care | Payer: Self-pay | Source: Ambulatory Visit | Attending: Surgery

## 2018-08-02 ENCOUNTER — Ambulatory Visit (HOSPITAL_COMMUNITY): Payer: No Typology Code available for payment source | Admitting: Anesthesiology

## 2018-08-02 DIAGNOSIS — K2950 Unspecified chronic gastritis without bleeding: Secondary | ICD-10-CM | POA: Diagnosis not present

## 2018-08-02 DIAGNOSIS — Z79890 Hormone replacement therapy (postmenopausal): Secondary | ICD-10-CM | POA: Insufficient documentation

## 2018-08-02 DIAGNOSIS — Z79899 Other long term (current) drug therapy: Secondary | ICD-10-CM | POA: Insufficient documentation

## 2018-08-02 DIAGNOSIS — I10 Essential (primary) hypertension: Secondary | ICD-10-CM | POA: Insufficient documentation

## 2018-08-02 DIAGNOSIS — Z9884 Bariatric surgery status: Secondary | ICD-10-CM | POA: Insufficient documentation

## 2018-08-02 DIAGNOSIS — T18128A Food in esophagus causing other injury, initial encounter: Secondary | ICD-10-CM | POA: Insufficient documentation

## 2018-08-02 DIAGNOSIS — Z87442 Personal history of urinary calculi: Secondary | ICD-10-CM | POA: Insufficient documentation

## 2018-08-02 DIAGNOSIS — X58XXXA Exposure to other specified factors, initial encounter: Secondary | ICD-10-CM | POA: Insufficient documentation

## 2018-08-02 DIAGNOSIS — Z888 Allergy status to other drugs, medicaments and biological substances status: Secondary | ICD-10-CM | POA: Insufficient documentation

## 2018-08-02 DIAGNOSIS — Z880 Allergy status to penicillin: Secondary | ICD-10-CM | POA: Insufficient documentation

## 2018-08-02 DIAGNOSIS — Z6832 Body mass index (BMI) 32.0-32.9, adult: Secondary | ICD-10-CM | POA: Insufficient documentation

## 2018-08-02 DIAGNOSIS — Z7982 Long term (current) use of aspirin: Secondary | ICD-10-CM | POA: Insufficient documentation

## 2018-08-02 DIAGNOSIS — Z9102 Food additives allergy status: Secondary | ICD-10-CM | POA: Insufficient documentation

## 2018-08-02 HISTORY — PX: BIOPSY: SHX5522

## 2018-08-02 HISTORY — PX: ESOPHAGOGASTRODUODENOSCOPY: SHX5428

## 2018-08-02 SURGERY — EGD (ESOPHAGOGASTRODUODENOSCOPY)
Anesthesia: Monitor Anesthesia Care

## 2018-08-02 MED ORDER — LIDOCAINE 2% (20 MG/ML) 5 ML SYRINGE
INTRAMUSCULAR | Status: DC | PRN
  Administered 2018-08-02: 100 mg via INTRAVENOUS

## 2018-08-02 MED ORDER — PROPOFOL 10 MG/ML IV BOLUS
INTRAVENOUS | Status: AC
  Filled 2018-08-02: qty 40

## 2018-08-02 MED ORDER — PROPOFOL 10 MG/ML IV BOLUS
INTRAVENOUS | Status: AC
  Filled 2018-08-02: qty 20

## 2018-08-02 MED ORDER — SODIUM CHLORIDE 0.9 % IV SOLN: INTRAVENOUS | Status: DC

## 2018-08-02 MED ORDER — PROPOFOL 10 MG/ML IV BOLUS
INTRAVENOUS | Status: DC | PRN
  Administered 2018-08-02 (×4): 20 mg via INTRAVENOUS

## 2018-08-02 MED ORDER — LACTATED RINGERS IV SOLN
INTRAVENOUS | Status: DC
  Administered 2018-08-02: 10:00:00 via INTRAVENOUS

## 2018-08-02 MED ORDER — PROPOFOL 500 MG/50ML IV EMUL
INTRAVENOUS | Status: DC | PRN
  Administered 2018-08-02: 140 ug/kg/min via INTRAVENOUS

## 2018-08-02 MED ORDER — ONDANSETRON HCL 4 MG/2ML IJ SOLN
INTRAMUSCULAR | Status: DC | PRN
  Administered 2018-08-02: 4 mg via INTRAVENOUS

## 2018-08-02 NOTE — Transfer of Care (Signed)
Immediate Anesthesia Transfer of Care Note  Patient: Paul Shannon  Procedure(s) Performed: UPPER ENDOSCOPY (N/A ) BIOPSY  Patient Location: PACU and Endoscopy Unit  Anesthesia Type:MAC  Level of Consciousness: awake, sedated and responds to stimulation  Airway & Oxygen Therapy: Patient Spontanous Breathing and Patient connected to nasal cannula oxygen  Post-op Assessment: Report given to RN and Post -op Vital signs reviewed and stable  Post vital signs: Reviewed and stable  Last Vitals:  Vitals Value Taken Time  BP    Temp    Pulse 64 08/02/2018 11:12 AM  Resp 10 08/02/2018 11:12 AM  SpO2 100 % 08/02/2018 11:12 AM  Vitals shown include unvalidated device data.  Last Pain:  Vitals:   08/02/18 0940  TempSrc: Oral  PainSc:          Complications: No apparent anesthesia complications

## 2018-08-02 NOTE — Interval H&P Note (Signed)
History and Physical Interval Note:  08/02/2018 10:39 AM  Paul Shannon  has presented today for surgery, with the diagnosis of Regurgitation with lap band  The various methods of treatment have been discussed with the patient and family.   Still having symptoms.  Wife at bedside.  After consideration of risks, benefits and other options for treatment, the patient has consented to  Procedure(s): UPPER ENDOSCOPY (N/A) as a surgical intervention .  The patient's history has been reviewed, patient examined, no change in status, stable for surgery.  I have reviewed the patient's chart and labs.  Questions were answered to the patient's satisfaction.     Shann Medal

## 2018-08-02 NOTE — Op Note (Addendum)
08/02/2018  11:14 AM  PATIENT:  Paul Shannon, 52 y.o., male, MRN: [id number]  PREOP DIAGNOSIS:  Lap band, persistent vomiting  POSTOP DIAGNOSIS:   Lap band, persistent vomiting, mild gastritis, retained fluid and food in distal esophagus  PROCEDURE:  Esophagogastroduedonoscopy  SURGEON:   Alphonsa Overall, M.D.  ANESTHESIA:   MAC sedation per anesthesia   Anesthesiologist: Lillia Abed, MD CRNA: Lollie Sails, CRNA; Sharlette Dense, CRNA  INDICATIONS FOR PROCEDURE:  Paul Shannon is a 52 y.o. (DOB: 1966-11-24)  white male whose primary care physician is Noralee Space, MD and comes for upper endoscopy to evaluate lap band and persistent vomiting   The indications and risks of the endoscopy were explained to the patient.  The risks include, but are not limited to, perforation, bleeding, or injury to the bowel.  PROCEDURE:  The patient was monitored with a pulse oximetry, BP cuff, and EKG.  The patient has nasal O2 flowing during the procedure.   He had anesthesia supervised by nurse anesthetist.   A flexible Olympus endoscope was passed down the throat without difficulty.  Findings include:   Esophagus:   He had fluid and a small amount of fluid in the esophagus consistent with a partial obstruction at the lap band   GE junction at:  39 cm   Lap band:  The lap band orifice was at about 44 cm.  There was some mild angulation, but the endoscope went through the orifice without difficulty.  I was able to get the scope through the opening of the lap band, it did not seem over tight, there was no erosion or ulcer.   Stomach: Mild gastritits.  Biopsies obtained x 3.   Duodenum:   Normal  PLAN:  Will discuss removal of the lap band.  Alphonsa Overall, MD, [facility] Surgery Pager: [phone] Office phone:  [phone]

## 2018-08-02 NOTE — Anesthesia Procedure Notes (Signed)
Date/Time: 08/02/2018 10:43 AM Performed by: Sharlette Dense, CRNA Oxygen Delivery Method: Nasal cannula

## 2018-08-02 NOTE — Discharge Instructions (Signed)
Esophagogastroduodenoscopy, Care After Refer to this sheet in the next few weeks. These instructions provide you with information about caring for yourself after your procedure. Your health care provider may also give you more specific instructions. Your treatment has been planned according to current medical practices, but problems sometimes occur. Call your health care provider if you have any problems or questions after your procedure. What can I expect after the procedure? After the procedure, it is common to have:  A sore throat.  Nausea.  Bloating.  Dizziness.  Fatigue.  Follow these instructions at home:  Do not eat or drink anything until the numbing medicine (local anesthetic) has worn off and your gag reflex has returned. You will know that the local anesthetic has worn off when you can swallow comfortably.  Do not drive for 24 hours if you received a medicine to help you relax (sedative).  If your health care provider took a tissue sample for testing during the procedure, make sure to get your test results. This is your responsibility. Ask your health care provider or the department performing the test when your results will be ready.  Keep all follow-up visits as told by your health care provider. This is important. Contact a health care provider if:  You cannot stop coughing.  You are not urinating.  You are urinating less than usual. Get help right away if:  You have trouble swallowing.  You cannot eat or drink.  You have throat or chest pain that gets worse.  You are dizzy or light-headed.  You faint.  You have nausea or vomiting.  You have chills.  You have a fever.  You have severe abdominal pain.  You have black, tarry, or bloody stools. This information is not intended to replace advice given to you by your health care provider. Make sure you discuss any questions you have with your health care provider. Document Released: 11/14/2012 Document  Revised: 05/05/2016 Document Reviewed: 10/22/2015 Elsevier Interactive Patient Education  2018 [place] TO PATIENT  Activity:  Driving - May drive tomorrow  Diet:  As tolerated  Medications and dosages:  Resume your home medications.  Call Dr. Lucia Gaskins or his office  [phone]) if you have:  Severe uncontrolled pain,  Difficulty breathing, headache or visual disturbances,  Any other questions or concerns you may have after discharge.  In an emergency, call 911 or go to an Emergency Department at a nearby hospital.

## 2018-08-02 NOTE — Anesthesia Preprocedure Evaluation (Signed)
Anesthesia Evaluation  Patient identified by MRN, date of birth, ID band Patient awake    Reviewed: Allergy & Precautions, NPO status , Patient's Chart, lab work & pertinent test results  Airway Mallampati: I  TM Distance: >3 FB Neck ROM: Full    Dental   Pulmonary    Pulmonary exam normal        Cardiovascular hypertension, Pt. on medications Normal cardiovascular exam     Neuro/Psych Anxiety    GI/Hepatic GERD  Medicated and Controlled,  Endo/Other    Renal/GU      Musculoskeletal   Abdominal   Peds  Hematology   Anesthesia Other Findings   Reproductive/Obstetrics                             Anesthesia Physical Anesthesia Plan  ASA: II  Anesthesia Plan: MAC   Post-op Pain Management:    Induction: Intravenous  PONV Risk Score and Plan: 1 and Ondansetron  Airway Management Planned: Simple Face Mask  Additional Equipment:   Intra-op Plan:   Post-operative Plan:   Informed Consent: I have reviewed the patients History and Physical, chart, labs and discussed the procedure including the risks, benefits and alternatives for the proposed anesthesia with the patient or authorized representative who has indicated his/her understanding and acceptance.     Plan Discussed with: CRNA and Surgeon  Anesthesia Plan Comments:         Anesthesia Quick Evaluation

## 2018-08-02 NOTE — Anesthesia Postprocedure Evaluation (Signed)
Anesthesia Post Note  Patient: Paul Shannon  Procedure(s) Performed: UPPER ENDOSCOPY (N/A ) BIOPSY     Patient location during evaluation: PACU Anesthesia Type: MAC Level of consciousness: awake and alert Pain management: pain level controlled Vital Signs Assessment: post-procedure vital signs reviewed and stable Respiratory status: spontaneous breathing, nonlabored ventilation, respiratory function stable and patient connected to nasal cannula oxygen Cardiovascular status: stable and blood pressure returned to baseline Postop Assessment: no apparent nausea or vomiting Anesthetic complications: no    Last Vitals:  Vitals:   08/02/18 1130 08/02/18 1134  BP: (!) 146/99   Pulse: (!) 57 (!) 53  Resp: 14 15  Temp:    SpO2: 98% 98%    Last Pain:  Vitals:   08/02/18 1134  TempSrc:   PainSc: 0-No pain                 Clarice Zulauf DAVID

## 2018-08-03 ENCOUNTER — Encounter (HOSPITAL_COMMUNITY): Payer: Self-pay | Admitting: Surgery

## 2018-08-09 ENCOUNTER — Other Ambulatory Visit: Payer: Self-pay

## 2018-08-09 ENCOUNTER — Encounter (HOSPITAL_COMMUNITY): Payer: Self-pay | Admitting: *Deleted

## 2018-08-09 MED ORDER — BUPIVACAINE LIPOSOME 1.3 % IJ SUSP
20.0000 mL | INTRAMUSCULAR | Status: DC
  Filled 2018-08-09: qty 20

## 2018-08-09 NOTE — H&P (Signed)
Paul Shannon  Location: [facility] Surgery Patient #: [phone] DOB: 09-14-1966 Married / Language: English / Race: White Male  History of Present Illness   The patient is a 52 year old male is here for LapBand followup.  The PCP is Dr. Jeannine Kitten  He comes by himself.  The patient is somewhat better. He still has night coughing about 2-3 times a week. He says that at night he is propped up more in bed. He is taking Protonix once a day. We talked about doubling the Protonx. He says it regurgitation can happen anytime, but alternates own saliva. He wonders whether he has a dairy allergy. He has modified his diet somewhat. We talked about the options from here. It sounds like his esophagus is not talo recovered. I think he would be best served with continued lap band vacation for a couple more months. If he still having symptoms, would consider repeating the upper GI or do an upper endoscopy. I told him that his esophagus made never fully recover and he may have some minor symptoms. He has gained a little weight and we talked about watching his portion control. He also talked about his sister who sound like she is undergoing a portal vein shunt.  Plan: 1) Follow up in 10 to 12 weeks  Story of lap band trouble: Mr Charney started having trouble with a cold and upper respiratory symptoms in November 2018. He Had increasing difficulty with reflux and night cough. He was treated both for reflux disease and given a course of antibiotics, but did not get better. He got an UGI on 02/17/2018 - 1. Abnormal barium esophagram with diffuse dilatation of the esophagus. 2. Lap band appears in good position. 3. Only a small amount of contrast passes through the lap band into the stomach in the upright, supine, and right left lateral decubitus positions. There is significant retention of contrast in the esophagus even in the upright  position. Dr. Lenna Gilford spoke to me about the patient. I was not available for several days, but he saw Puja in our office.   Past Medical History: 1. Morbid obesity - weight 228, BMI - 32.7  Lap Band, APS - 12/25/2012 2. Lap band too tight On lap band holidary from 04/05/2018 >>>> 3. Esophageal nodule - benign squamous papilloma. 4. History of diverticulitis. 5. Hypertension 6. Seasonal allergies 7. History of kidney stones. 8. Questionable right inguinal hernia. Does not really bother him. 9. On thyroid replacement. For about 10 years. TSH - 0.19 - 06/12/2012. 10. Liver with 70% steatosis changes. Biopsy done during his lap chole on 05/11/2012. He has a NASH score of 8. Significant improvement of liver disease on most recent laparoscopy/liver biopsy - 12/25/2012  11. Biliary dyskinesia - S/P lap chole - 05/11/2012 - D. Ina Poupard 12. 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 Coralie Common about 05/22/2013 He did very well from this.  Social History: Married. 2 children, ages 34 and 26. 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  His sister has liver failure, has esophageal varices, and is not a liver transplant candidate.  Allergies (Tanisha A. Owens Shark, [place]; 06/06/2018 10:58 AM) Bio-Statin *ANTIFUNGALS*  Statins Support *DIETARY PRODUCTS/DIETARY MANAGEMENT PRODUCTS*  Red Dyes  Penicillins  Allergies Reconciled   Medication History (Tanisha A. Owens Shark, [place]; 06/06/2018 10:59 AM) ALPRAZolam (0.5MG  Tablet, Oral) Active. Levothyroxine Sodium (150MCG Tablet, Oral) Active. Aspirin (81MG  Tablet Chewable, Oral) Active. Medications Reconciled  Vitals Yvetta Coder  AOwens Shark RMA; 06/06/2018 10:58 AM) 06/06/2018 10:58 AM Weight: 183.2 lb Height: 69in Body Surface Area: 1.99 m Body Mass Index: 27.05 kg/m  Temp.: 98.53F  Pulse: 86 (Regular)  BP:  132/94 (Sitting, Left Arm, Standard)  Physical Exam  General: WN WM alert and generally healthy appearing. White beard. HEENT: Normal. Pupils equal.  Neck: Supple. No mass. No thyroid mass.  Lymph Nodes: No supraclavicular or cervical nodes.  Lungs: Clear to auscultation and symmetric breath sounds. Heart: RRR. No murmur or rub.  Abdomen: Soft. No mass. No tenderness. No hernia. Normal bowel sounds.  Port in [place].   Extremities: Good strength and ROM in upper and lower extremities.   Assessment & Plan  1.  HISTORY OF LAPAROSCOPIC ADJUSTABLE GASTRIC BANDING (Z98.84)  Story: Lap band, APS - 12/25/2012 - D. Jedidiah Demartini     Lap band too tight On lap band holidary from 04/05/2018 >>>>  Addendum Note(Karnell Vanderloop H. Talana Slatten MD; 06/29/2018 3:30 PM)  UGI on 7/17 shows persistent narrowing at lap band with some retention of contrast above the lap band.  Despite lap band holiday - continues to have symptoms of the lap band being too tight.  Will remove the lap band  2. Esophageal nodule - benign squamous papilloma. 3. History of diverticulitis. 4. Hypertension 5. History of kidney stones. 6. On thyroid replacement. For about 10 years. TSH - 0.19 - 06/12/2012. 7. Liver with 70% steatosis changes. Biopsy done during his lap chole on 05/11/2012. He has a NASH score of 8. Significant improvement of liver disease on most recent laparoscopy/liver biopsy - 12/25/2012  8. 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 Coralie Common about 05/22/2013 He did very well from this.   Alphonsa Overall, MD, [facility] Surgery Pager: [phone] Office phone:  [phone]

## 2018-08-10 ENCOUNTER — Encounter: Payer: Self-pay | Admitting: Family Medicine

## 2018-08-10 ENCOUNTER — Encounter (HOSPITAL_COMMUNITY): Payer: Self-pay | Admitting: *Deleted

## 2018-08-10 ENCOUNTER — Encounter (HOSPITAL_COMMUNITY)
Admission: RE | Disposition: A | Discharge status: Home / Self Care | Payer: Self-pay | Source: Ambulatory Visit | Attending: Surgery

## 2018-08-10 ENCOUNTER — Other Ambulatory Visit: Payer: Self-pay

## 2018-08-10 ENCOUNTER — Observation Stay (HOSPITAL_COMMUNITY)
Admission: RE | Admit: 2018-08-10 | Discharge: 2018-08-11 | DRG: 989 | Disposition: A | Discharge status: Home / Self Care | Payer: No Typology Code available for payment source | Source: Ambulatory Visit | Attending: Surgery | Admitting: Surgery

## 2018-08-10 ENCOUNTER — Ambulatory Visit (HOSPITAL_COMMUNITY): Payer: No Typology Code available for payment source | Admitting: Anesthesiology

## 2018-08-10 DIAGNOSIS — Z7982 Long term (current) use of aspirin: Secondary | ICD-10-CM | POA: Insufficient documentation

## 2018-08-10 DIAGNOSIS — I10 Essential (primary) hypertension: Secondary | ICD-10-CM | POA: Diagnosis not present

## 2018-08-10 DIAGNOSIS — K910 Vomiting following gastrointestinal surgery: Secondary | ICD-10-CM | POA: Diagnosis present

## 2018-08-10 DIAGNOSIS — Z6827 Body mass index (BMI) 27.0-27.9, adult: Secondary | ICD-10-CM | POA: Diagnosis not present

## 2018-08-10 DIAGNOSIS — K219 Gastro-esophageal reflux disease without esophagitis: Secondary | ICD-10-CM | POA: Diagnosis not present

## 2018-08-10 DIAGNOSIS — Z791 Long term (current) use of non-steroidal anti-inflammatories (NSAID): Secondary | ICD-10-CM | POA: Diagnosis not present

## 2018-08-10 DIAGNOSIS — Z79890 Hormone replacement therapy (postmenopausal): Secondary | ICD-10-CM | POA: Insufficient documentation

## 2018-08-10 DIAGNOSIS — K9509 Other complications of gastric band procedure: Principal | ICD-10-CM | POA: Insufficient documentation

## 2018-08-10 DIAGNOSIS — Y848 Other medical procedures as the cause of abnormal reaction of the patient, or of later complication, without mention of misadventure at the time of the procedure: Secondary | ICD-10-CM | POA: Diagnosis not present

## 2018-08-10 DIAGNOSIS — Z79899 Other long term (current) drug therapy: Secondary | ICD-10-CM | POA: Insufficient documentation

## 2018-08-10 HISTORY — DX: Vomiting following gastrointestinal surgery: K91.0

## 2018-08-10 SURGERY — REMOVAL, GASTRIC BAND, LAPAROSCOPIC
Anesthesia: General

## 2018-08-10 MED ORDER — LACTATED RINGERS IV SOLN
INTRAVENOUS | Status: DC
  Administered 2018-08-10: 08:00:00 via INTRAVENOUS

## 2018-08-10 MED ORDER — ONDANSETRON HCL 4 MG/2ML IJ SOLN: 4.0000 mg | Freq: Four times a day (QID) | INTRAMUSCULAR | Status: DC | PRN

## 2018-08-10 MED ORDER — LIDOCAINE 2% (20 MG/ML) 5 ML SYRINGE
INTRAMUSCULAR | Status: AC
  Filled 2018-08-10: qty 5

## 2018-08-10 MED ORDER — SUCCINYLCHOLINE CHLORIDE 200 MG/10ML IV SOSY
PREFILLED_SYRINGE | INTRAVENOUS | Status: AC
  Filled 2018-08-10: qty 10

## 2018-08-10 MED ORDER — BUPIVACAINE-EPINEPHRINE (PF) 0.25% -1:200000 IJ SOLN
INTRAMUSCULAR | Status: AC
  Filled 2018-08-10: qty 30

## 2018-08-10 MED ORDER — LEVOTHYROXINE SODIUM 25 MCG PO TABS
125.0000 ug | ORAL_TABLET | Freq: Every day | ORAL | Status: DC
  Administered 2018-08-11: 125 ug via ORAL
  Filled 2018-08-10: qty 1

## 2018-08-10 MED ORDER — SCOPOLAMINE 1 MG/3DAYS TD PT72
1.0000 | MEDICATED_PATCH | TRANSDERMAL | Status: DC
  Administered 2018-08-10: 1.5 mg via TRANSDERMAL
  Filled 2018-08-10: qty 1

## 2018-08-10 MED ORDER — ONDANSETRON HCL 4 MG/2ML IJ SOLN
INTRAMUSCULAR | Status: AC
  Filled 2018-08-10: qty 2

## 2018-08-10 MED ORDER — FENTANYL CITRATE (PF) 100 MCG/2ML IJ SOLN
INTRAMUSCULAR | Status: AC
  Filled 2018-08-10: qty 2

## 2018-08-10 MED ORDER — AMLODIPINE BESYLATE 5 MG PO TABS: 5.0000 mg | ORAL_TABLET | Freq: Every day | ORAL | Status: DC

## 2018-08-10 MED ORDER — MIDAZOLAM HCL 2 MG/2ML IJ SOLN
INTRAMUSCULAR | Status: AC
  Filled 2018-08-10: qty 2

## 2018-08-10 MED ORDER — FENTANYL CITRATE (PF) 100 MCG/2ML IJ SOLN
25.0000 ug | INTRAMUSCULAR | Status: DC | PRN
  Administered 2018-08-10 (×3): 50 ug via INTRAVENOUS

## 2018-08-10 MED ORDER — ONDANSETRON HCL 4 MG/2ML IJ SOLN
INTRAMUSCULAR | Status: DC | PRN
  Administered 2018-08-10: 4 mg via INTRAVENOUS

## 2018-08-10 MED ORDER — PROMETHAZINE HCL 25 MG/ML IJ SOLN: 6.2500 mg | INTRAMUSCULAR | Status: DC | PRN

## 2018-08-10 MED ORDER — EPHEDRINE 5 MG/ML INJ
INTRAVENOUS | Status: AC
  Filled 2018-08-10: qty 10

## 2018-08-10 MED ORDER — HYDROCODONE-ACETAMINOPHEN 5-325 MG PO TABS: 1.0000 | ORAL_TABLET | ORAL | Status: DC | PRN

## 2018-08-10 MED ORDER — DEXAMETHASONE SODIUM PHOSPHATE 10 MG/ML IJ SOLN
INTRAMUSCULAR | Status: DC | PRN
  Administered 2018-08-10: 10 mg via INTRAVENOUS

## 2018-08-10 MED ORDER — TRAMADOL HCL 50 MG PO TABS
50.0000 mg | ORAL_TABLET | Freq: Four times a day (QID) | ORAL | Status: DC | PRN
  Administered 2018-08-11: 50 mg via ORAL
  Filled 2018-08-10: qty 1

## 2018-08-10 MED ORDER — SUGAMMADEX SODIUM 200 MG/2ML IV SOLN
INTRAVENOUS | Status: DC | PRN
  Administered 2018-08-10: 200 mg via INTRAVENOUS

## 2018-08-10 MED ORDER — SODIUM CHLORIDE 0.9 % IV SOLN
2.0000 g | INTRAVENOUS | Status: AC
  Administered 2018-08-10: 2 g via INTRAVENOUS
  Filled 2018-08-10: qty 2

## 2018-08-10 MED ORDER — MORPHINE SULFATE (PF) 2 MG/ML IV SOLN
1.0000 mg | INTRAVENOUS | Status: DC | PRN
  Administered 2018-08-10 – 2018-08-11 (×3): 2 mg via INTRAVENOUS
  Filled 2018-08-10 (×3): qty 1

## 2018-08-10 MED ORDER — ROCURONIUM BROMIDE 50 MG/5ML IV SOSY
PREFILLED_SYRINGE | INTRAVENOUS | Status: DC | PRN
  Administered 2018-08-10: 5 mg via INTRAVENOUS
  Administered 2018-08-10: 50 mg via INTRAVENOUS

## 2018-08-10 MED ORDER — ACETAMINOPHEN 500 MG PO TABS
1000.0000 mg | ORAL_TABLET | ORAL | Status: AC
  Administered 2018-08-10: 1000 mg via ORAL
  Filled 2018-08-10: qty 2

## 2018-08-10 MED ORDER — EPHEDRINE SULFATE-NACL 50-0.9 MG/10ML-% IV SOSY
PREFILLED_SYRINGE | INTRAVENOUS | Status: DC | PRN
  Administered 2018-08-10 (×2): 5 mg via INTRAVENOUS

## 2018-08-10 MED ORDER — PROPOFOL 10 MG/ML IV BOLUS
INTRAVENOUS | Status: DC | PRN
  Administered 2018-08-10: 150 mg via INTRAVENOUS

## 2018-08-10 MED ORDER — CHLORHEXIDINE GLUCONATE CLOTH 2 % EX PADS: 6.0000 | MEDICATED_PAD | Freq: Once | CUTANEOUS | Status: DC

## 2018-08-10 MED ORDER — LACTATED RINGERS IR SOLN
Status: DC | PRN
  Administered 2018-08-10: 1000 mL

## 2018-08-10 MED ORDER — ALPRAZOLAM 0.5 MG PO TABS
0.5000 mg | ORAL_TABLET | Freq: Every day | ORAL | Status: DC
  Administered 2018-08-10: 0.5 mg via ORAL
  Filled 2018-08-10: qty 1

## 2018-08-10 MED ORDER — ROCURONIUM BROMIDE 100 MG/10ML IV SOLN
INTRAVENOUS | Status: AC
  Filled 2018-08-10: qty 1

## 2018-08-10 MED ORDER — SUCCINYLCHOLINE CHLORIDE 200 MG/10ML IV SOSY
PREFILLED_SYRINGE | INTRAVENOUS | Status: DC | PRN
  Administered 2018-08-10: 120 mg via INTRAVENOUS

## 2018-08-10 MED ORDER — FENTANYL CITRATE (PF) 250 MCG/5ML IJ SOLN
INTRAMUSCULAR | Status: AC
  Filled 2018-08-10: qty 5

## 2018-08-10 MED ORDER — MIDAZOLAM HCL 5 MG/5ML IJ SOLN
INTRAMUSCULAR | Status: DC | PRN
  Administered 2018-08-10: 2 mg via INTRAVENOUS

## 2018-08-10 MED ORDER — ONDANSETRON 4 MG PO TBDP: 4.0000 mg | ORAL_TABLET | Freq: Four times a day (QID) | ORAL | Status: DC | PRN

## 2018-08-10 MED ORDER — GABAPENTIN 300 MG PO CAPS
300.0000 mg | ORAL_CAPSULE | ORAL | Status: AC
  Administered 2018-08-10: 300 mg via ORAL
  Filled 2018-08-10: qty 1

## 2018-08-10 MED ORDER — BUPIVACAINE HCL 0.25 % IJ SOLN
INTRAMUSCULAR | Status: DC | PRN
  Administered 2018-08-10: 30 mL

## 2018-08-10 MED ORDER — PROPOFOL 10 MG/ML IV BOLUS
INTRAVENOUS | Status: AC
  Filled 2018-08-10: qty 20

## 2018-08-10 MED ORDER — FENTANYL CITRATE (PF) 100 MCG/2ML IJ SOLN
INTRAMUSCULAR | Status: DC | PRN
  Administered 2018-08-10 (×3): 50 ug via INTRAVENOUS

## 2018-08-10 MED ORDER — DEXAMETHASONE SODIUM PHOSPHATE 10 MG/ML IJ SOLN
INTRAMUSCULAR | Status: AC
  Filled 2018-08-10: qty 1

## 2018-08-10 MED ORDER — ACETAMINOPHEN 500 MG PO TABS
1000.0000 mg | ORAL_TABLET | Freq: Four times a day (QID) | ORAL | Status: DC
  Administered 2018-08-10 – 2018-08-11 (×4): 1000 mg via ORAL
  Filled 2018-08-10 (×4): qty 2

## 2018-08-10 MED ORDER — SUGAMMADEX SODIUM 200 MG/2ML IV SOLN
INTRAVENOUS | Status: AC
  Filled 2018-08-10: qty 2

## 2018-08-10 MED ORDER — HEPARIN SODIUM (PORCINE) 5000 UNIT/ML IJ SOLN
5000.0000 [IU] | Freq: Three times a day (TID) | INTRAMUSCULAR | Status: DC
  Administered 2018-08-10 – 2018-08-11 (×2): 5000 [IU] via SUBCUTANEOUS
  Filled 2018-08-10 (×2): qty 1

## 2018-08-10 MED ORDER — LIDOCAINE 2% (20 MG/ML) 5 ML SYRINGE
INTRAMUSCULAR | Status: DC | PRN
  Administered 2018-08-10: 100 mg via INTRAVENOUS

## 2018-08-10 MED ORDER — KCL IN DEXTROSE-NACL 20-5-0.45 MEQ/L-%-% IV SOLN
INTRAVENOUS | Status: DC
  Administered 2018-08-10 – 2018-08-11 (×2): via INTRAVENOUS
  Filled 2018-08-10 (×2): qty 1000

## 2018-08-10 MED ORDER — BUPIVACAINE LIPOSOME 1.3 % IJ SUSP
INTRAMUSCULAR | Status: DC | PRN
  Administered 2018-08-10: 20 mL

## 2018-08-10 MED ORDER — PANTOPRAZOLE SODIUM 40 MG PO TBEC
40.0000 mg | DELAYED_RELEASE_TABLET | Freq: Two times a day (BID) | ORAL | Status: DC
  Administered 2018-08-10: 40 mg via ORAL
  Filled 2018-08-10: qty 1

## 2018-08-10 MED ORDER — 0.9 % SODIUM CHLORIDE (POUR BTL) OPTIME
TOPICAL | Status: DC | PRN
  Administered 2018-08-10: 1000 mL

## 2018-08-10 SURGICAL SUPPLY — 52 items
BLADE HEX COATED 2.75 (ELECTRODE) ×3 IMPLANT
BLADE SURG 15 STRL LF DISP TIS (BLADE) IMPLANT
BLADE SURG 15 STRL SS (BLADE)
BLADE SURG SZ11 CARB STEEL (BLADE) ×3 IMPLANT
CABLE HIGH FREQUENCY MONO STRZ (ELECTRODE) ×3 IMPLANT
DECANTER SPIKE VIAL GLASS SM (MISCELLANEOUS) ×3 IMPLANT
DERMABOND ADVANCED (GAUZE/BANDAGES/DRESSINGS) ×2
DERMABOND ADVANCED .7 DNX12 (GAUZE/BANDAGES/DRESSINGS) ×1 IMPLANT
DEVICE SUT QUICK LOAD TK 5 (STAPLE) IMPLANT
DEVICE SUT TI-KNOT TK 5X26 (MISCELLANEOUS) IMPLANT
DEVICE SUTURE ENDOST 10MM (ENDOMECHANICALS) IMPLANT
DEVICE TI KNOT TK5 (MISCELLANEOUS)
DRAPE UTILITY XL STRL (DRAPES) ×3 IMPLANT
ELECT PENCIL ROCKER SW 15FT (MISCELLANEOUS) ×3 IMPLANT
ELECT REM PT RETURN 15FT ADLT (MISCELLANEOUS) ×3 IMPLANT
GLOVE BIOGEL PI IND STRL 7.0 (GLOVE) ×1 IMPLANT
GLOVE BIOGEL PI INDICATOR 7.0 (GLOVE) ×2
GOWN SPEC L4 XLG W/TWL (GOWN DISPOSABLE) ×3 IMPLANT
GOWN STRL REUS W/ TWL XL LVL3 (GOWN DISPOSABLE) ×2 IMPLANT
GOWN STRL REUS W/TWL LRG LVL3 (GOWN DISPOSABLE) ×6 IMPLANT
GOWN STRL REUS W/TWL XL LVL3 (GOWN DISPOSABLE) ×4
HOVERMATT SINGLE USE (MISCELLANEOUS) IMPLANT
KIT BASIN OR (CUSTOM PROCEDURE TRAY) ×3 IMPLANT
NEEDLE SPNL 22GX3.5 QUINCKE BK (NEEDLE) ×3 IMPLANT
PACK UNIVERSAL I (CUSTOM PROCEDURE TRAY) ×3 IMPLANT
QUICK LOAD TK 5 (STAPLE)
SCISSORS LAP 5X45 EPIX DISP (ENDOMECHANICALS) ×3 IMPLANT
SET IRRIG TUBING LAPAROSCOPIC (IRRIGATION / IRRIGATOR) ×3 IMPLANT
SHEARS HARMONIC ACE PLUS 36CM (ENDOMECHANICALS) ×3 IMPLANT
SLEEVE ADV FIXATION 5X100MM (TROCAR) ×3 IMPLANT
SOLUTION ANTI FOG 6CC (MISCELLANEOUS) ×3 IMPLANT
SPONGE LAP 18X18 RF (DISPOSABLE) ×3 IMPLANT
STAPLER VISISTAT 35W (STAPLE) IMPLANT
SUT ETHIBOND 2 0 SH (SUTURE)
SUT ETHIBOND 2 0 SH 36X2 (SUTURE) IMPLANT
SUT MNCRL AB 4-0 PS2 18 (SUTURE) ×6 IMPLANT
SUT PROLENE 2 0 CT2 30 (SUTURE) ×3 IMPLANT
SUT SILK 0 (SUTURE) ×2
SUT SILK 0 30XBRD TIE 6 (SUTURE) ×1 IMPLANT
SUT SURGIDAC NAB ES-9 0 48 120 (SUTURE) IMPLANT
SUT VIC AB 2-0 SH 27 (SUTURE) ×2
SUT VIC AB 2-0 SH 27X BRD (SUTURE) ×1 IMPLANT
SUT VIC AB 3-0 SH 18 (SUTURE) ×3 IMPLANT
SYR 20CC LL (SYRINGE) ×3 IMPLANT
SYR CONTROL 10ML LL (SYRINGE) ×3 IMPLANT
TOWEL OR 17X26 10 PK STRL BLUE (TOWEL DISPOSABLE) ×3 IMPLANT
TOWEL OR NON WOVEN STRL DISP B (DISPOSABLE) ×3 IMPLANT
TROCAR ADV FIXATION 11X100MM (TROCAR) IMPLANT
TROCAR BLADELESS OPT 5 100 (ENDOMECHANICALS) ×6 IMPLANT
TROCAR XCEL NON-BLD 11X100MML (ENDOMECHANICALS) IMPLANT
TUBE CALIBRATION LAPBAND (TUBING) IMPLANT
TUBING INSUF HEATED (TUBING) IMPLANT

## 2018-08-10 NOTE — Op Note (Addendum)
08/10/2018  11:05 AM  PATIENT:  Paul Shannon, 52 y.o., male, MRN: [id number]  PREOP DIAGNOSIS:  OBSTRUCTED LAP BAND  POSTOP DIAGNOSIS:   Obstructing lap band  PROCEDURE:   Procedure(s):   LAPAROSCOPIC REMOVAL OF GASTRIC BAND and lap band port [photos at the end of note]  SURGEON:   Alphonsa Overall, M.D.  Terrence DupontClyda Greener, M.D.  ANESTHESIA:   general  Anesthesiologist: Duane Boston, MD CRNA: Lind Covert, CRNA; Maxwell Caul, CRNA  General  EBL:  minimal  ml  BLOOD ADMINISTERED: none  DRAINS: none   LOCAL MEDICATIONS USED:   20 cc of Exparel and 30 cc 1/4 marcaine  SPECIMEN:   None  COUNTS CORRECT:  YES  INDICATIONS FOR PROCEDURE:  Paul Shannon is a 52 y.o. (DOB: 1966-11-17) white male whose primary care physician is Noralee Space, MD and comes for removal of lap band.  He has had successful weight loss with the lap band, but has developed chronic GI obstructive symptoms related to the lap band.  He now comes for removal of the lap band.   The indications and risks of the surgery were explained to the patient.  The risks include, but are not limited to, infection, bleeding, and nerve injury.  PROCEDURE: The patient was taken to OR room #2 at [facility] long.  He was given general anesthetic.  His abdomen shaved and prepped with ChloraPrep and sterilely draped.  A timeout was held the surgical checklist run.  I accessed the abdominal cavity with a 5 mm Optiview Ethicon trocar in the left upper quadrant.  I placed 4 additional trochars: A 5 mm trocar to the left of midline, a 5 mm trocar to the right of midline near the Lap Band port, a 5 mm trocar in the right upper quadrant, and a 5 mm trocar in the subxiphoid location for the liver retractor.  Abdominal exploration revealed right and left lobe liver unremarkable.  The liver looked healthy.  The stomach was unremarkable.  The boiwel that I can see is unremarkable.  The LAP-BAND had minimal adhesions around the  upper abdominal structures.  There was some evidence of a small anterior slip of the band, and I think this explains his persistent symptoms.  It looked like there was a narrow margin to try to resite the band.  But he had so much trouble with the Lap Band that I do not think it is worth trying to resite the Lap Band at this time.  I then removed the LAP-BAND without difficulty.  I had upsized the right upper quadrant trocar to a 15 mm trocar and brought the LAP-BAND pieces through this.  I took down the gastric wrap lab work to the band in case in the future we need to get to the cardia of the stomach.  I then remove the LAP-BAND port and the mesh backing.  I infiltrated the abdominal wall bilaterally for a tap block with a local anesthetic.  I also injected local anesthetic at each wound site.  I used a total of 50 cc the mixture of Exparel and quarter percent Marcaine.  I then closed the port site wound with 3-0 Vicryl sutures.  Closed each wound with 4-0 Monocryl suture and painted the wounds with Dermabond.  I have a surgeon as a first assist to retract, expose, and assist on this difficult operation.  The patient tolerated procedure well transferred recovery in good condition    Top left  and top right:  Lap band.  Small anterior slip.  Minimal adhesions. Bottom left:  Stomach after removal of the lap band  Alphonsa Overall, MD, [facility] Surgery Pager: [phone] Office phone:  [phone]

## 2018-08-10 NOTE — Anesthesia Procedure Notes (Signed)
Procedure Name: Intubation Date/Time: 08/10/2018 9:37 AM Performed by: Maxwell Caul, CRNA Pre-anesthesia Checklist: Patient identified, Emergency Drugs available, Suction available and Patient being monitored Patient Re-evaluated:Patient Re-evaluated prior to induction Oxygen Delivery Method: Circle system utilized Preoxygenation: Pre-oxygenation with 100% oxygen Induction Type: IV induction, Rapid sequence and Cricoid Pressure applied Laryngoscope Size: Mac and 4 Grade View: Grade I Tube type: Oral Tube size: 7.5 mm Number of attempts: 1 Airway Equipment and Method: Stylet Placement Confirmation: ETT inserted through vocal cords under direct vision,  positive ETCO2 and breath sounds checked- equal and bilateral Secured at: 21 cm Tube secured with: Tape Dental Injury: Teeth and Oropharynx as per pre-operative assessment

## 2018-08-10 NOTE — Plan of Care (Signed)
Pt alert and oriented, resting with wife at the bedside.  Minimal complaints of pain, scheduled tylenol given.  Tolerating clear liquids. RN will monitor.

## 2018-08-10 NOTE — Anesthesia Postprocedure Evaluation (Signed)
Anesthesia Post Note  Patient: Paul Shannon  Procedure(s) Performed: LAPAROSCOPIC REMOVAL OF GASTRIC BAND (N/A )     Patient location during evaluation: PACU Anesthesia Type: General Level of consciousness: sedated Pain management: pain level controlled Vital Signs Assessment: post-procedure vital signs reviewed and stable Respiratory status: spontaneous breathing and respiratory function stable Cardiovascular status: stable Postop Assessment: no apparent nausea or vomiting Anesthetic complications: no    Last Vitals:  Vitals:   08/10/18 1420 08/10/18 1550  BP: 130/80 133/74  Pulse: 74 66  Resp: 16 17  Temp: 36.7 C 37.1 C  SpO2: 99% 99%    Last Pain:  Vitals:   08/10/18 1550  TempSrc: Oral  PainSc:                  Kammy Klett DANIEL

## 2018-08-10 NOTE — Anesthesia Preprocedure Evaluation (Addendum)
Anesthesia Evaluation  Patient identified by MRN, date of birth, ID band Patient awake    Reviewed: Allergy & Precautions, NPO status , Patient's Chart, lab work & pertinent test results  History of Anesthesia Complications (+) PONV and history of anesthetic complications  Airway Mallampati: II  TM Distance: >3 FB Neck ROM: Full    Dental no notable dental hx. (+) Teeth Intact, Dental Advisory Given   Pulmonary neg pulmonary ROS,    Pulmonary exam normal        Cardiovascular hypertension, Pt. on medications Normal cardiovascular exam     Neuro/Psych PSYCHIATRIC DISORDERS Anxiety negative neurological ROS     GI/Hepatic Neg liver ROS, GERD  Medicated and Controlled,  Endo/Other  Hypothyroidism   Renal/GU negative Renal ROS     Musculoskeletal   Abdominal   Peds  Hematology   Anesthesia Other Findings   Reproductive/Obstetrics                            Anesthesia Physical  Anesthesia Plan  ASA: II  Anesthesia Plan: General   Post-op Pain Management:    Induction: Intravenous, Rapid sequence and Cricoid pressure planned  PONV Risk Score and Plan: 1 and 3 and Ondansetron, Dexamethasone and Scopolamine patch - Pre-op  Airway Management Planned: Oral ETT  Additional Equipment:   Intra-op Plan:   Post-operative Plan: Extubation in OR  Informed Consent: I have reviewed the patients History and Physical, chart, labs and discussed the procedure including the risks, benefits and alternatives for the proposed anesthesia with the patient or authorized representative who has indicated his/her understanding and acceptance.   Dental advisory given  Plan Discussed with: CRNA and Anesthesiologist  Anesthesia Plan Comments:       Anesthesia Quick Evaluation

## 2018-08-10 NOTE — Discharge Instructions (Signed)
[facility]   [facility] - DISCHARGE INSTRUCTIONS TO PATIENT  Activity:  Driving - May drive in 2 - 4 days, if doing well   Lifting - No lifting more than 15 pounds for 14 days.  Wound Care:   Leave the incisions dry until Sunday, then may shower  Diet:  Eat light  Follow up appointment:  Call Dr. Pollie Friar office [facility] Surgery) at [phone] for an appointment in 2 to 4 weeks.  Medications and dosages:  Resume your home medications.  You have a prescription for:  Vicodin  Call Dr. Lucia Gaskins or his office  [phone]) if you have:  Temperature greater than 100.4,  Persistent nausea and vomiting,  Severe uncontrolled pain,  Redness, tenderness, or signs of infection (pain, swelling, redness, odor or green/yellow discharge around the site),  Difficulty breathing, headache or visual disturbances,  Any other questions or concerns you may have after discharge.  In an emergency, call 911 or go to an Emergency Department at a nearby hospital.

## 2018-08-10 NOTE — Transfer of Care (Signed)
Immediate Anesthesia Transfer of Care Note  Patient: Paul Shannon  Procedure(s) Performed: LAPAROSCOPIC REMOVAL OF GASTRIC BAND (N/A )  Patient Location: PACU  Anesthesia Type:General  Level of Consciousness: awake, alert  and oriented  Airway & Oxygen Therapy: Patient Spontanous Breathing and Patient connected to face mask oxygen  Post-op Assessment: Report given to RN and Post -op Vital signs reviewed and stable  Post vital signs: Reviewed and stable  Last Vitals:  Vitals Value Taken Time  BP    Temp    Pulse    Resp    SpO2      Last Pain:  Vitals:   08/10/18 0803  TempSrc: Oral         Complications: No apparent anesthesia complications

## 2018-08-10 NOTE — Interval H&P Note (Signed)
History and Physical Interval Note:  08/10/2018 9:20 AM  Paul Shannon  has presented today for surgery, with the diagnosis of OBSTRUCTED LAP BAND  The various methods of treatment have been discussed with the patient and family.  Wife at bedside.  After consideration of risks, benefits and other options for treatment, the patient has consented to  Procedure(s): LAPAROSCOPIC REMOVAL OF GASTRIC BAND WITH UPPER ENDO (N/A) as a surgical intervention .  The patient's history has been reviewed, patient examined, no change in status, stable for surgery.  I have reviewed the patient's chart and labs.  Questions were answered to the patient's satisfaction.     Shann Medal

## 2018-08-11 DIAGNOSIS — K9509 Other complications of gastric band procedure: Secondary | ICD-10-CM | POA: Diagnosis not present

## 2018-08-11 MED ORDER — HYDROCODONE-ACETAMINOPHEN 5-325 MG PO TABS: 1.0000 | ORAL_TABLET | Freq: Four times a day (QID) | ORAL | 0 refills | Status: DC | PRN

## 2018-08-11 NOTE — Plan of Care (Signed)
Pt up ambulating in hall this morning; c/o indigestion. Reviewed d/c instructions and copy given to patient. IV removed. Pt ready for discharge.

## 2018-08-11 NOTE — Discharge Summary (Signed)
Physician Discharge Summary  Patient ID: Paul Shannon MRN: [id number] DOB/AGE: [birth year] 51 y.o.  PCP: Noralee Space, MD  Admit date: 08/10/2018 Discharge date: 08/11/2018  Admission Diagnoses:  lapband dysfunction  Discharge Diagnoses:  same  Active Problems:   Vomiting (bilious) following gastrointestinal surgery   Surgery:  Removal of lapband  Discharged Condition: improved  Hospital Course:   Patient had removal of lapband by Dr. Lucia Gaskins on Friday.  Did well overnight and ready for discharge on Saturday.  Consults: none  Significant Diagnostic Studies: none    Discharge Exam: Blood pressure (!) 132/92, pulse (!) 55, temperature 98.1 F (36.7 C), temperature source Oral, resp. rate 16, height 5\' 10"  (1.778 m), weight 80.3 kg, SpO2 97 %. Incisions with Dermabond and look ok  Disposition: Discharge disposition: 01-Home or Self Care       Discharge Instructions    Ambulate hourly while awake   Complete by:  As directed    Call MD for:  difficulty breathing, headache or visual disturbances   Complete by:  As directed    Call MD for:  persistant dizziness or light-headedness   Complete by:  As directed    Call MD for:  persistant nausea and vomiting   Complete by:  As directed    Call MD for:  redness, tenderness, or signs of infection (pain, swelling, redness, odor or green/yellow discharge around incision site)   Complete by:  As directed    Call MD for:  severe uncontrolled pain   Complete by:  As directed    Call MD for:  temperature >101 F   Complete by:  As directed    Diet bariatric full liquid   Complete by:  As directed    Incentive spirometry   Complete by:  As directed    Perform hourly while awake     Allergies as of 08/11/2018      Reactions   Niacin    REACTION: pt states INTOL w/ flushing   Other    Red dye=rash with a tattoo   Penicillins    REACTION: childhood - extreme rash   Red Dye    Rosuvastatin    REACTION: pt states INTOL      Medication List    TAKE these medications   ALPRAZolam 0.5 MG tablet Commonly known as:  XANAX Take 1/2 to 1 tablet by mouth three times daily as needed What changed:    how much to take  how to take this  when to take this  additional instructions   amLODipine 5 MG tablet Commonly known as:  NORVASC TAKE 1 TABLET(5 MG) BY MOUTH DAILY What changed:  See the new instructions.   ciprofloxacin 500 MG tablet Commonly known as:  CIPRO Take 1 tablet (500 mg total) by mouth 2 (two) times daily.   HYDROcodone-acetaminophen 5-325 MG tablet Commonly known as:  NORCO/VICODIN Take 1-2 tablets by mouth every 6 (six) hours as needed for moderate pain.   ibuprofen 200 MG tablet Commonly known as:  ADVIL,MOTRIN Take 400 mg by mouth every 8 (eight) hours as needed (for pain.).   levothyroxine 125 MCG tablet Commonly known as:  SYNTHROID, LEVOTHROID Take 1 tablet (125 mcg total) by mouth daily before breakfast.   pantoprazole 40 MG tablet Commonly known as:  PROTONIX TAKE 1 TABLET BY MOUTH TWICE DAILY FOR TWO WEEKS, THEN 1 TABLET DAILY        Signed: Pedro Earls 08/11/2018, 8:21 AM

## 2018-08-18 ENCOUNTER — Other Ambulatory Visit: Payer: Self-pay | Admitting: Pulmonary Disease

## 2018-08-27 ENCOUNTER — Other Ambulatory Visit: Payer: Self-pay | Admitting: *Deleted

## 2018-08-27 ENCOUNTER — Encounter: Payer: Self-pay | Admitting: Pulmonary Disease

## 2018-08-27 MED ORDER — ALPRAZOLAM 0.5 MG PO TABS: ORAL_TABLET | ORAL | 2 refills | Status: DC

## 2018-08-27 NOTE — Telephone Encounter (Signed)
Per 08/27/18 e-mail from patient:  Paul Shannon to Paul Space, Paul Shannon  08/27/18 10:10 AM  Paul.[name]  I wanted to let you know that I had to have the lap band removed. Part of my stomach somehow was sucked up into my band and that was causing my issues. So now I have to be even more cautions of my diet and health.   Also I keep striking out on finding a my primary care doc. I was looking at the [facility] in [place] but the one guy a liked is not taken new patients for primary care just sport medicine.(Paul.Copeland). They have a lot of PA, do you have anyone else to reach out to? I'll do my home work on them just not sure if I should go with a PA. I'm good with the [facility] or [employer] location. With my liver issues just want to make sure I tried to get the right person. Thanks for all you have done for me and my family.   Also I can't submit a refill for my Xanax what do I need to do?   Paul Shannon  [phone]  DOB 22-Jan-1966     Paul Shannon, please advise if there is a PCP that you recommend for patient and also Paul Shannon vs if PA is okay.  Patient is also requesting a refill on his Xanax 0.5mg  [facility].  Last refilled 2.24.19 #90 with 5 refills.  Thank you.

## 2018-09-18 ENCOUNTER — Other Ambulatory Visit: Payer: Self-pay | Admitting: Pulmonary Disease

## 2018-11-22 ENCOUNTER — Encounter: Payer: Self-pay | Admitting: Podiatry

## 2018-11-22 ENCOUNTER — Ambulatory Visit (INDEPENDENT_AMBULATORY_CARE_PROVIDER_SITE_OTHER): Payer: PRIVATE HEALTH INSURANCE

## 2018-11-22 ENCOUNTER — Ambulatory Visit (INDEPENDENT_AMBULATORY_CARE_PROVIDER_SITE_OTHER): Payer: PRIVATE HEALTH INSURANCE | Admitting: Podiatry

## 2018-11-22 VITALS — BP 178/115 | HR 65 | Resp 16

## 2018-11-22 DIAGNOSIS — M2012 Hallux valgus (acquired), left foot: Secondary | ICD-10-CM

## 2018-11-22 DIAGNOSIS — M2011 Hallux valgus (acquired), right foot: Secondary | ICD-10-CM

## 2018-11-22 NOTE — Patient Instructions (Signed)

## 2018-11-22 NOTE — Progress Notes (Signed)
   Subjective:    Patient ID: Paul Shannon, male    DOB: 1966/08/03, 52 y.o.   MRN: [id number]  HPI    Review of Systems  All other systems reviewed and are negative.      Objective:   Physical Exam        Assessment & Plan:

## 2018-11-22 NOTE — Progress Notes (Signed)
Subjective:   Patient ID: Paul Shannon, male   DOB: 52 y.o.   MRN: [id number]   HPI Patient presents with painful bunion deformity left foot that is bothered him for over a fairly long time and is worsened over the last couple months.  Patient states he has been trying wider shoes and soaks without relief and is reaching a point he cannot wear shoe gear with any degree of comfort and states the right one is not as bad.  Patient does not smoke likes to be active   Review of Systems  All other systems reviewed and are negative.       Objective:  Physical Exam Vitals signs and nursing note reviewed.  Constitutional:      Appearance: He is well-developed.  Pulmonary:     Effort: Pulmonary effort is normal.  Musculoskeletal: Normal range of motion.  Skin:    General: Skin is warm.  Neurological:     Mental Status: He is alert.     Neurovascular status intact muscle strength is adequate patient noted to have blood pressure that high today but he states it is not generally high.  He has redness around the first metatarsal head left over right with pain with palpation and moderate deviation of the hallux with no crepitus of the joint and mild deformity noted right foot.  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  Patient states he is tried shoe gear modifications he is tried other modalities and he would go ahead and get this fixed and I have recommended a distal osteotomy explained we not get complete correction but he will stay ambulatory.  Patient wants surgery and is scheduled for consult and is tentatively scheduled for surgery third week of January I also want him to have his blood pressure checked by his family physician  X-ray indicates structural bunion deformity left over right with redness  around the first metatarsal head and pain with palpation

## 2018-12-03 ENCOUNTER — Ambulatory Visit (INDEPENDENT_AMBULATORY_CARE_PROVIDER_SITE_OTHER): Payer: PRIVATE HEALTH INSURANCE | Admitting: Podiatry

## 2018-12-03 ENCOUNTER — Encounter: Payer: Self-pay | Admitting: Podiatry

## 2018-12-03 DIAGNOSIS — M2011 Hallux valgus (acquired), right foot: Secondary | ICD-10-CM

## 2018-12-03 DIAGNOSIS — M2012 Hallux valgus (acquired), left foot: Secondary | ICD-10-CM

## 2018-12-03 NOTE — Patient Instructions (Signed)
Pre-Operative Instructions  Congratulations, you have decided to take an important step towards improving your quality of life.  You can be assured that the doctors and staff at [facility] will be with you every step of the way.  Here are some important things you should know:  1. Plan to be at the surgery center/hospital at least 1 (one) hour prior to your scheduled time, unless otherwise directed by the surgical center/hospital staff.  You must have a responsible adult accompany you, remain during the surgery and drive you home.  Make sure you have directions to the surgical center/hospital to ensure you arrive on time. 2. If you are having surgery at [facility] or [facility], you will need a copy of your medical history and physical form from your family physician within one month prior to the date of surgery. We will give you a form for your primary physician to complete.  3. We make every effort to accommodate the date you request for surgery.  However, there are times where surgery dates or times have to be moved.  We will contact you as soon as possible if a change in schedule is required.   4. No aspirin/ibuprofen for one week before surgery.  If you are on aspirin, any non-steroidal anti-inflammatory medications (Mobic, Aleve, Ibuprofen) should not be taken seven (7) days prior to your surgery.  You make take Tylenol for pain prior to surgery.  5. Medications - If you are taking daily heart and blood pressure medications, seizure, reflux, allergy, asthma, anxiety, pain or diabetes medications, make sure you notify the surgery center/hospital before the day of surgery so they can tell you which medications you should take or avoid the day of surgery. 6. No food or drink after midnight the night before surgery unless directed otherwise by surgical center/hospital staff. 7. No alcoholic beverages 24-hours prior to surgery.  No smoking 24-hours prior or 24-hours after  surgery. 8. Wear loose pants or shorts. They should be loose enough to fit over bandages, boots, and casts. 9. Don't wear slip-on shoes. Sneakers are preferred. 10. Bring your boot with you to the surgery center/hospital.  Also bring crutches or a walker if your physician has prescribed it for you.  If you do not have this equipment, it will be provided for you after surgery. 11. If you have not been contacted by the surgery center/hospital by the day before your surgery, call to confirm the date and time of your surgery. 12. Leave-time from work may vary depending on the type of surgery you have.  Appropriate arrangements should be made prior to surgery with your employer. 13. Prescriptions will be provided immediately following surgery by your doctor.  Fill these as soon as possible after surgery and take the medication as directed. Pain medications will not be refilled on weekends and must be approved by the doctor. 14. Remove nail polish on the operative foot and avoid getting pedicures prior to surgery. 15. Wash the night before surgery.  The night before surgery wash the foot and leg well with water and the antibacterial soap provided. Be sure to pay special attention to beneath the toenails and in between the toes.  Wash for at least three (3) minutes. Rinse thoroughly with water and dry well with a towel.  Perform this wash unless told not to do so by your physician.  Enclosed: 1 Ice pack (please put in freezer the night before surgery)   1 Hibiclens skin cleaner     Pre-op instructions  If you have any questions regarding the instructions, please do not hesitate to call our office.  [place]: [address], , [place] [address] -- [phone]  [place]: [address] [place], [place] [address] -- [phone]  [place]: [address]  [place], [place] [address] -- [phone]  [place]: [address], [address], [place],  [address] -- [phone]  Website:  [email or web address] 

## 2018-12-04 NOTE — Progress Notes (Signed)
Subjective:   Patient ID: Paul Shannon, male   DOB: 52 y.o.   MRN: [id number]   HPI Patient presents stating that he is ready to get the bunion fixed on the left and he wants to discuss the procedure   ROS      Objective:  Physical Exam  Neurovascular status found to be intact muscle strength is adequate range of motion within normal limits with patient found to have prominent bunion deformity left with redness and pain with palpation.  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  I allowed him to go over consent form reviewing alternative treatments complications and I explained he may not be able to get complete resolution of symptoms but I do think that we will be able to put this in a much better functioning position and patient wants surgery understanding the surgery and risk and also the recovery can take approximately 6 months to 1 year.  Patient is scheduled for outpatient surgery and air fracture walker dispensed today with all instructions on usage.  Encouraged to call with questions prior to procedure

## 2018-12-12 HISTORY — PX: BUNIONECTOMY: SHX129

## 2019-01-01 ENCOUNTER — Encounter: Payer: Self-pay | Admitting: Podiatry

## 2019-01-01 DIAGNOSIS — M2012 Hallux valgus (acquired), left foot: Secondary | ICD-10-CM

## 2019-01-07 ENCOUNTER — Ambulatory Visit (INDEPENDENT_AMBULATORY_CARE_PROVIDER_SITE_OTHER): Payer: PRIVATE HEALTH INSURANCE

## 2019-01-07 ENCOUNTER — Ambulatory Visit (INDEPENDENT_AMBULATORY_CARE_PROVIDER_SITE_OTHER): Payer: PRIVATE HEALTH INSURANCE | Admitting: Podiatry

## 2019-01-07 DIAGNOSIS — M2011 Hallux valgus (acquired), right foot: Secondary | ICD-10-CM | POA: Diagnosis not present

## 2019-01-07 DIAGNOSIS — Z09 Encounter for follow-up examination after completed treatment for conditions other than malignant neoplasm: Secondary | ICD-10-CM

## 2019-01-07 DIAGNOSIS — M2012 Hallux valgus (acquired), left foot: Secondary | ICD-10-CM | POA: Diagnosis not present

## 2019-01-09 NOTE — Progress Notes (Signed)
Subjective:   Patient ID: Paul Shannon, male   DOB: 53 y.o.   MRN: [id number]   HPI Patient states doing real well with foot with minimal discomfort and able to walk   ROS      Objective:  Physical Exam  Neurovascular status intact negative Homans sign noted wound edges well coapted first MPJ left with good alignment of the hallux and no drainage with range of motion adequate for this.  Postop     Assessment:  Doing well post osteotomy first metatarsal left     Plan:  H&P condition reviewed and at this point reviewed x-rays with patient.  I recommended continued immobilization elevation compression and reapplied dressing and reappoint in the next several weeks for reevaluation and encouraged to call with any questions concerns  X-ray indicates osteotomies healing well alignment in place fixation in place joint congruence

## 2019-01-11 ENCOUNTER — Telehealth: Payer: Self-pay | Admitting: Pulmonary Disease

## 2019-01-11 MED ORDER — AZITHROMYCIN 250 MG PO TABS: ORAL_TABLET | ORAL | 0 refills | Status: DC

## 2019-01-11 NOTE — Telephone Encounter (Signed)
Pt is calling back [phone]

## 2019-01-11 NOTE — Telephone Encounter (Signed)
Please try Mucinex DM Twice daily  As needed congestion  Saline nasal rinses   Zpack #1 take as directed , to have on hold if symptoms do not improve or worsen with discolored mucus.   Ov if not improving  Please contact office for sooner follow up if symptoms do not improve or worsen or seek emergency care

## 2019-01-11 NOTE — Telephone Encounter (Signed)
Primary Pulmonologist: Dr Lenna Gilford Last office visit and with whom: 04/17/18, Dr Lenna Gilford What do we see them for (pulmonary problems): PCP pt/chronic cough Last OV assessment/plan:      Instructions   Today we updated your med list in our EPIC system...    Continue your current medications the same...  Please return to our lab one morning this week for your FASTING blood work...    We will contact you w/ the results when available...   Keep your follow up visits w/ [facility] surgery for swallowing follow up & lap-band adjustments...  We reviewed the needed ANTIREFLUX REGIMEN:    Take the Protonix ~75min before the evening meal...    Do not eat or drink after dinner in the eve...    Elevate the head of your bed ~6" on blocks  Call for any questions or if we can be of service in any way...        Was appointment offered to patient (explain)?  Yes, declined. Patient stated he is needing a zpak.   Reason for call:  Patient called requested a z pak, because he stated that he had the beginning of sinus infection.  He is having sinus pressure and head congestion. He denies cough, or fever. Patient is aware SN is retiring.  He stated that he is currently trying to find a new PCP.  He has no future pulmonary appointment, but has been previously seen by Dr Elsworth Soho, 02/2018.    Will route to Alvira Monday, NP

## 2019-01-11 NOTE — Telephone Encounter (Signed)
LMTCB

## 2019-01-11 NOTE — Telephone Encounter (Signed)
Called and spoke with Patient. Tammy P, NP, recommendations given.  Understanding stated.  Z pak prescription called into requested pharmacy, for white tablets due to red dye allergy.  Nothing further at this time.

## 2019-01-21 ENCOUNTER — Ambulatory Visit (INDEPENDENT_AMBULATORY_CARE_PROVIDER_SITE_OTHER): Payer: PRIVATE HEALTH INSURANCE

## 2019-01-21 ENCOUNTER — Ambulatory Visit (INDEPENDENT_AMBULATORY_CARE_PROVIDER_SITE_OTHER): Payer: PRIVATE HEALTH INSURANCE | Admitting: Podiatry

## 2019-01-21 ENCOUNTER — Encounter: Payer: Self-pay | Admitting: Podiatry

## 2019-01-21 DIAGNOSIS — M2012 Hallux valgus (acquired), left foot: Secondary | ICD-10-CM | POA: Diagnosis not present

## 2019-01-21 DIAGNOSIS — M2011 Hallux valgus (acquired), right foot: Secondary | ICD-10-CM

## 2019-01-21 DIAGNOSIS — Z09 Encounter for follow-up examination after completed treatment for conditions other than malignant neoplasm: Secondary | ICD-10-CM | POA: Diagnosis not present

## 2019-01-21 NOTE — Progress Notes (Signed)
Subjective:   Patient ID: Paul Shannon, male   DOB: 53 y.o.   MRN: [id number]   HPI Patient states overall doing well with mild discomfort of 5 been on it too long but overall him back to activity and work and pleased with how things are going   ROS      Objective:  Physical Exam  Neurovascular status intact with good healing osteotomy first metatarsal left wound edges well coapted hallux in rectus position with good range of motion     Assessment:  Doing well post osteotomy first metatarsal left     Plan:  H&P condition reviewed and at this point reviewed x-ray and advised this patient on gradual increase in activities continue range of motion exercises at compression  X-rays indicate the osteotomy is healing well structural position good good reduction of intermetatarsal angle with joint congruence fixation in place

## 2019-02-12 ENCOUNTER — Telehealth: Payer: Self-pay | Admitting: Adult Health

## 2019-02-12 NOTE — Telephone Encounter (Signed)
Called and spoke with Patient. Patient requested assistance with finding a new PCP since Dr Jeannine Kitten retirement.  Printed list of [facility] PCP taking new patients placed in envelope, for out going mail. Nothing further at this time.

## 2019-02-18 ENCOUNTER — Ambulatory Visit (INDEPENDENT_AMBULATORY_CARE_PROVIDER_SITE_OTHER): Payer: PRIVATE HEALTH INSURANCE

## 2019-02-18 ENCOUNTER — Encounter: Payer: Self-pay | Admitting: Podiatry

## 2019-02-18 ENCOUNTER — Ambulatory Visit (INDEPENDENT_AMBULATORY_CARE_PROVIDER_SITE_OTHER): Payer: PRIVATE HEALTH INSURANCE | Admitting: Podiatry

## 2019-02-18 DIAGNOSIS — M2012 Hallux valgus (acquired), left foot: Secondary | ICD-10-CM | POA: Diagnosis not present

## 2019-02-18 DIAGNOSIS — Z09 Encounter for follow-up examination after completed treatment for conditions other than malignant neoplasm: Secondary | ICD-10-CM

## 2019-02-18 MED ORDER — TERBINAFINE HCL 250 MG PO TABS: 250.0000 mg | ORAL_TABLET | Freq: Every day | ORAL | 0 refills | Status: DC

## 2019-02-20 NOTE — Progress Notes (Signed)
Subjective:   Patient ID: Paul Shannon, male   DOB: 53 y.o.   MRN: [id number]   HPI Patient states overall feeling good and back to normal activities but he still gets swelling and funny feelings if he does too much   ROS      Objective:  Physical Exam  Neurovascular status intact with patient found to have well-healed surgical site left first metatarsal with wound edges well coapted good range of motion and moderate swelling around the first MPJ     Assessment:  Doing well overall with normal amount of swelling that is consistent with his excessive activity levels     Plan:  H&P conditions reviewed and I have recommended gradual increase in activity levels at this time.  Is encouraged to still be careful with it and not do any type of jumping on the foot and will be seen back in 8 weeks for final visit or earlier if needed  X-ray indicates the osteotomy is healing well with slight stress on it which is probably due to his excessive activities but overall very pleased with the results at this point

## 2019-02-25 ENCOUNTER — Telehealth: Payer: Self-pay

## 2019-02-25 NOTE — Telephone Encounter (Signed)
Called and spoke with pt letting her know the recs per TP. Pt expressed understanding. Nothing further needed.

## 2019-02-25 NOTE — Telephone Encounter (Signed)
  Primary Pulmonologist: Dr. Lenna Gilford was PCP (Pt has new PCP upcoming appt but not until April) Last office visit and with whom: 04/17/18 with SN What do we see them for (pulmonary problems): PCP pt of SN (last seen for chronic cough) Last OV assessment/plan: Instructions   Today we updated your med list in our EPIC system...    Continue your current medications the same...  Please return to our lab one morning this week for your FASTING blood work...    We will contact you w/ the results when available...   Keep your follow up visits w/ [facility] surgery for swallowing follow up & lap-band adjustments...  We reviewed the needed ANTIREFLUX REGIMEN:    Take the Protonix ~74min before the evening meal...    Do not eat or drink after dinner in the eve...    Elevate the head of your bed ~6" on blocks  Call for any questions or if we can be of service in any way...        Was appointment offered to patient (explain)?  No; pt requesting meds to be called in   Reason for call: Called and spoke with pt who stated he believes he might have a sinus infection with bloody green mucus from nose and states his gums were also sore.  Pt stated symptoms began 4 days ago but stated after mowing the lawn two days ago symptoms became worse. Pt denies any fever, no recent travel.  Pt states he typically has a sinus infection about once a year. Pt requesting meds to be called in to help with symptoms. Tammy, please advise for pt. Thanks!

## 2019-02-25 NOTE — Telephone Encounter (Signed)
Please try mucinex dm Twice daily As needed  Cough/congestion  Saline nasal rinses As needed   flonase 2 puffs daily  Fluids and rest  Tylenol As needed     If not improving or worsens call back sooner.   Please contact office for sooner follow up if symptoms do not improve or worsen or seek emergency care

## 2019-02-25 NOTE — Telephone Encounter (Signed)
Called office stating he realizes that Dr. Lenna Gilford has retired but he has an appt with his new PCP but he is needing something for his sinus infection. Denies fever. He states Saturday while mowing the grass he started having bloody green mucous from his nose. He state this typically happens once a year. He states he takes zyrtec to help but it makes him very drowsy. He states he typically gets something from TP. Denies recent travel and he has not been around anyone sick. He tried a nasal spray that seemed to make it worse. He states typically when he gets a sinus infections his gums are sore as well.   Pharmacy: [facility] on [address]    Call back # [phone]

## 2019-03-08 ENCOUNTER — Telehealth: Payer: Self-pay | Admitting: Pulmonary Disease

## 2019-03-08 NOTE — Telephone Encounter (Signed)
lvm to r/s 4/1 appt.

## 2019-03-13 ENCOUNTER — Ambulatory Visit: Payer: PRIVATE HEALTH INSURANCE | Admitting: Family Medicine

## 2019-03-25 ENCOUNTER — Other Ambulatory Visit: Payer: Self-pay | Admitting: Pulmonary Disease

## 2019-03-26 ENCOUNTER — Other Ambulatory Visit: Payer: Self-pay | Admitting: Nurse Practitioner

## 2019-03-28 NOTE — Telephone Encounter (Signed)
He will need to wait on the xanax until seen by PCP. He has not been seen here in almost 1 year. I did not approve the refill on the Norvasc because I was out of the office on the day it was sent in. It should have been sent in a triage message because this is not a typical pulmonary medication and the provider that was previously seeing the patient is retired. It should be ok to refill the Norvasc until seen by PCP. Thanks.

## 2019-03-28 NOTE — Telephone Encounter (Signed)
Pt returned call. Stated to pt that I had sent a message to TP in regards to the refill request for amlodipine but after further looking into this, it looks like Paul Arms, NP had approved a refill of his med on 03/26/2019.  I made pt aware that the Rx was sent to pharmacy.  While also speaking with pt, pt stated he was also needing a refill on Xanax. Pt has been out of med for a little over a month now.   Tonya, please advise if you are okay refilling pt's Xanax to hold him over until he has been able to have appt with new PCP. Pt does have a new PCP appt scheduled in June. His original PCP appt was scheduled for April 2020 but that had to be cancelled due to [place].

## 2019-03-28 NOTE — Telephone Encounter (Signed)
Refill request received for pt's amlodipine. Pt was a PCP pt of Dr. Lenna Gilford, last seen 04/17/18. Checked pt's past appts and pt it looks like pt has not been able to have an appt yet for establishment with a new PCP due to COVID-19. Tammy, please advise if you are okay with [place] refilling med for pt until he is able to have appt with new PCP. Pt does have an appt scheduled with new PCP in June 2020 for establishment. Thanks!

## 2019-03-28 NOTE — Telephone Encounter (Signed)
Called and spoke with pt stating to him that [place] refused refill of xanax since it has been almost a year since he has been seen at office. Pt expressed understanding. Nothing further needed.

## 2019-04-08 ENCOUNTER — Other Ambulatory Visit: Payer: PRIVATE HEALTH INSURANCE | Admitting: Podiatry

## 2019-04-10 ENCOUNTER — Ambulatory Visit (INDEPENDENT_AMBULATORY_CARE_PROVIDER_SITE_OTHER): Payer: Commercial Managed Care - PPO | Admitting: Podiatry

## 2019-04-10 ENCOUNTER — Other Ambulatory Visit: Payer: Self-pay

## 2019-04-10 ENCOUNTER — Ambulatory Visit (INDEPENDENT_AMBULATORY_CARE_PROVIDER_SITE_OTHER): Payer: Commercial Managed Care - PPO

## 2019-04-10 ENCOUNTER — Encounter: Payer: Self-pay | Admitting: Podiatry

## 2019-04-10 VITALS — Temp 97.7°F

## 2019-04-10 DIAGNOSIS — M2012 Hallux valgus (acquired), left foot: Secondary | ICD-10-CM

## 2019-04-10 DIAGNOSIS — Z09 Encounter for follow-up examination after completed treatment for conditions other than malignant neoplasm: Secondary | ICD-10-CM | POA: Diagnosis not present

## 2019-04-10 NOTE — Progress Notes (Signed)
Subjective:   Patient ID: Paul Shannon, male   DOB: 53 y.o.   MRN: [id number]   HPI Patient states doing very well after surgery very pleased with occasional swelling still if he is on his foot too much   ROS      Objective:  Physical Exam  Neurovascular status intact with patient's left foot healing well wound edges well coapted hallux in rectus position good range of motion with no crepitus of the joint     Assessment:  Doing well post Liane Comber type osteotomy first metatarsal left     Plan:  H&P x-rays reviewed and at this point I recommended gradual return to normal shoe and activity continue compression elevation and range of motion exercises.  Patient's discharge will be seen back as needed  X-ray indicates osteotomies healing well good alignment noted fixation in place joint congruence

## 2019-05-16 ENCOUNTER — Telehealth: Payer: Self-pay | Admitting: Family Medicine

## 2019-05-16 NOTE — Telephone Encounter (Signed)
Called pt to discuss making new patient appt a virtual visit. If he is able to, we need to make it later so he can come by prior for vital check. Lvm asking him to call office.

## 2019-05-20 ENCOUNTER — Encounter: Payer: Self-pay | Admitting: Family Medicine

## 2019-05-20 ENCOUNTER — Other Ambulatory Visit: Payer: Self-pay

## 2019-05-20 ENCOUNTER — Ambulatory Visit (INDEPENDENT_AMBULATORY_CARE_PROVIDER_SITE_OTHER): Payer: Commercial Managed Care - PPO | Admitting: Family Medicine

## 2019-05-20 ENCOUNTER — Other Ambulatory Visit (INDEPENDENT_AMBULATORY_CARE_PROVIDER_SITE_OTHER): Payer: Commercial Managed Care - PPO

## 2019-05-20 ENCOUNTER — Telehealth: Payer: Self-pay | Admitting: Family Medicine

## 2019-05-20 VITALS — BP 168/114 | HR 75 | Temp 98.2°F | Resp 16 | Ht 68.0 in | Wt 227.2 lb

## 2019-05-20 DIAGNOSIS — E7800 Pure hypercholesterolemia, unspecified: Secondary | ICD-10-CM | POA: Diagnosis not present

## 2019-05-20 DIAGNOSIS — I10 Essential (primary) hypertension: Secondary | ICD-10-CM | POA: Diagnosis not present

## 2019-05-20 DIAGNOSIS — K760 Fatty (change of) liver, not elsewhere classified: Secondary | ICD-10-CM | POA: Diagnosis not present

## 2019-05-20 DIAGNOSIS — E039 Hypothyroidism, unspecified: Secondary | ICD-10-CM

## 2019-05-20 LAB — COMPREHENSIVE METABOLIC PANEL
ALT: 87 U/L — ABNORMAL HIGH (ref 0–53)
AST: 63 U/L — ABNORMAL HIGH (ref 0–37)
Albumin: 4.5 g/dL (ref 3.5–5.2)
Alkaline Phosphatase: 85 U/L (ref 39–117)
BUN: 18 mg/dL (ref 6–23)
CO2: 28 mEq/L (ref 19–32)
Calcium: 9.5 mg/dL (ref 8.4–10.5)
Chloride: 103 mEq/L (ref 96–112)
Creatinine, Ser: 0.96 mg/dL (ref 0.40–1.50)
GFR: 82.15 mL/min (ref >60.00)
Glucose, Bld: 86 mg/dL (ref 70–99)
Potassium: 3.6 mEq/L (ref 3.5–5.1)
Sodium: 139 mEq/L (ref 135–145)
Total Bilirubin: 0.8 mg/dL (ref 0.2–1.2)
Total Protein: 7.5 g/dL (ref 6.0–8.3)

## 2019-05-20 LAB — TSH: TSH: 25.24 u[IU]/mL — ABNORMAL HIGH (ref 0.35–4.50)

## 2019-05-20 LAB — LIPID PANEL
Cholesterol: 130 mg/dL (ref 0–200)
HDL: 37.5 mg/dL — ABNORMAL LOW (ref >39.00)
LDL Cholesterol: 63 mg/dL (ref 0–99)
NonHDL: 92.45
Total CHOL/HDL Ratio: 3
Triglycerides: 145 mg/dL (ref 0.0–149.0)
VLDL: 29 mg/dL (ref 0.0–40.0)

## 2019-05-20 MED ORDER — AMLODIPINE BESYLATE 5 MG PO TABS: ORAL_TABLET | ORAL | 2 refills | Status: DC

## 2019-05-20 NOTE — Assessment & Plan Note (Signed)
ASCVD risk is 6%. No need for medication. Will continue to monitor

## 2019-05-20 NOTE — Patient Instructions (Addendum)
Sleep hygiene checklist: ?1. Avoid naps during the day ?2. Avoid stimulants such as caffeine and nicotine. Avoid bedtime alcohol (it can speed onset of sleep but the body's metabolism can cause awakenings). At least 2 hours before bedtime ?3. All forms of exercise help ensure sound sleep - limit vigorous exercise to morning or late afternoon ?4. Avoid food too close to bedtime including chocolate (which contains caffeine) ?5. Soak up natural light ?6. Establish regular bedtime routine. ?7. Associate bed with sleep - avoid TV, computer or phone, reading while in bed. ?8. Ensure pleasant, relaxing sleep environment - quiet, dark, cool room. ? ?Good Sleep Hygiene Habits ?-- Got to bed and wake up within an hour of the same time every day ?-- Avoid bright screens (from laptop, phone, TV) within at least 30 minutes before bed. The "blue light" supresses the sleep hormone melatonin and the content may stimulate as well ?-- Maintain a quiet and dark sleep environment (blackout curtains, turn on a fan or white noise to block out disruptive sounds) ?-- Practicing relaxing activites before bed (taking a shower, reading a book, journaling, meditation app) ?-- To quiet a busy mind -- consider journaling before bed (jotting down reminders, worry thoughts, as well as positive things like a gratitude list) ? ? ?Begin a Mindfulness/Meditation practice -- this can take a little as 3 minutes ?-- You can find resources in books ?-- Or you can download apps like  ?---- Headspace App (which currently has free content called "Weathering the Storm") ?---- Calm (which has a few free options)  ?---- Insignt Timer ?---- Stop, Breathe & Think ? ?# With each of these Apps - you should decline the "start free trial" offer and as you search through the App should be able to access some of their free content. You can also chose to pay for the content if you find one that works well for you.  ? ?# Many of them also offer sleep specific content  which may help with insomnia ? ?

## 2019-05-20 NOTE — Assessment & Plan Note (Addendum)
Improved after lap band, but that has been removed due to complications. Will recheck labs today as he has gained around 30 lbs. AST/ALT elevated - will myChart patient to encourage weightloss/diet changes

## 2019-05-20 NOTE — Telephone Encounter (Signed)
Attempted to call patient to relay lab results.   Got voicemail. Said I would relay results in [employer]

## 2019-05-20 NOTE — Assessment & Plan Note (Signed)
BP elevated today in setting of being out of medications. Refilled meds but advised close follow-up if bp elevated on home monitoring.

## 2019-05-20 NOTE — Progress Notes (Signed)
Subjective:     Paul Shannon is a 53 y.o. male presenting for Establish Care (previous PCP Dr. Lenna Gilford.) and Discuss fatty liver (Lap band helped his levels. )     HPI   #HTN - has been out of medication since Friday - checks bp at home and typically 120/80 - can get nervous before appointments - notices that his BP will be high when his weight is up - no cp, sob, ha, vision changes  #s/p Lap band - improved fatty liver disease - then having some reflux symptoms > which improved since removing the lap band - then have some restrictions at the band on recent imaging - lab band was removed - dad and sister progressed to cirrhosis - did get a liver biopsy when the GB was removed - removed the lap band in November - the 177 was after severe n/V and liquid diet for months - baseline before was 195   #Anxiety/insomnia - was taking 1 xanax or 1/2 tablet every night - taking melatonin now - will sleep from around 10-2:30 am - racing thoughts are what is keeping him up at night - occasionally daytime symptoms, but mostly doing better now - starts his day at 5:30 AM    Review of Systems  See HPI   Social History   Tobacco Use  Smoking Status Never Smoker  Smokeless Tobacco Never Used        Objective:    BP Readings from Last 3 Encounters:  05/20/19 (!) 168/114  11/22/18 (!) 178/115  08/11/18 (!) 132/92   Wt Readings from Last 3 Encounters:  05/20/19 227 lb 4 oz (103.1 kg)  08/10/18 177 lb (80.3 kg)  08/02/18 177 lb (80.3 kg)    BP (!) 168/114   Pulse 75   Temp 98.2 F (36.8 C)   Resp 16   Ht 5\' 8"  (1.727 m)   Wt 227 lb 4 oz (103.1 kg)   BMI 34.55 kg/m    Physical Exam Constitutional:      Appearance: Normal appearance. He is not ill-appearing or diaphoretic.  HENT:     Right Ear: External ear normal.     Left Ear: External ear normal.     Nose: Nose normal.  Eyes:     General: No scleral icterus.    Extraocular Movements: Extraocular  movements intact.     Conjunctiva/sclera: Conjunctivae normal.  Neck:     Musculoskeletal: Neck supple.  Cardiovascular:     Rate and Rhythm: Normal rate and regular rhythm.     Heart sounds: No murmur.  Pulmonary:     Effort: Pulmonary effort is normal. No respiratory distress.     Breath sounds: Normal breath sounds. No wheezing.  Skin:    General: Skin is warm and dry.  Neurological:     Mental Status: He is alert. Mental status is at baseline.  Psychiatric:        Mood and Affect: Mood normal.    Lab Results  Component Value Date   CHOL 130 05/20/2019   HDL 37.50 (L) 05/20/2019   LDLCALC 63 05/20/2019   TRIG 145.0 05/20/2019   CHOLHDL 3 05/20/2019    The 10-year ASCVD risk score Mikey Bussing DC Jr., et al., 2013) is: 6%   Values used to calculate the score:     Age: 53 years     Sex: Male     Is Non-Hispanic African American: No     Diabetic: No  Tobacco smoker: No     Systolic Blood Pressure: 680 mmHg     Is BP treated: Yes     HDL Cholesterol: 37.5 mg/dL     Total Cholesterol: 130 mg/dL   CMP     Component Value Date/Time   NA 139 05/20/2019 0905   K 3.6 05/20/2019 0905   CL 103 05/20/2019 0905   CO2 28 05/20/2019 0905   GLUCOSE 86 05/20/2019 0905   BUN 18 05/20/2019 0905   CREATININE 0.96 05/20/2019 0905   CALCIUM 9.5 05/20/2019 0905   PROT 7.5 05/20/2019 0905   ALBUMIN 4.5 05/20/2019 0905   AST 63 (H) 05/20/2019 0905   ALT 87 (H) 05/20/2019 0905   ALKPHOS 85 05/20/2019 0905   BILITOT 0.8 05/20/2019 0905   GFRNONAA >60 07/30/2018 1015   GFRAA >60 07/30/2018 1015         Assessment & Plan:   Problem List Items Addressed This Visit      Cardiovascular and Mediastinum   Essential hypertension - Primary    BP elevated today in setting of being out of medications. Refilled meds but advised close follow-up if bp elevated on home monitoring.       Relevant Medications   amLODipine (NORVASC) 5 MG tablet   Other Relevant Orders   Lipid panel  (Completed)   Comprehensive metabolic panel (Completed)     Digestive   Hepatic steatosis    Improved after lap band, but that has been removed due to complications. Will recheck labs today as he has gained around 30 lbs.       Relevant Orders   Lipid panel (Completed)   Comprehensive metabolic panel (Completed)     Endocrine   Hypothyroidism     Other   HYPERCHOLESTEROLEMIA    ASCVD risk is 6%. No need for medication. Will continue to monitor      Relevant Medications   amLODipine (NORVASC) 5 MG tablet       Return in about 4 weeks (around 06/17/2019) for physical.  Lesleigh Noe, MD

## 2019-05-21 ENCOUNTER — Telehealth: Payer: Self-pay | Admitting: Family Medicine

## 2019-05-21 DIAGNOSIS — E039 Hypothyroidism, unspecified: Secondary | ICD-10-CM

## 2019-05-21 MED ORDER — LEVOTHYROXINE SODIUM 150 MCG PO TABS: 150.0000 ug | ORAL_TABLET | Freq: Every day | ORAL | 1 refills | Status: DC

## 2019-05-21 NOTE — Addendum Note (Signed)
Addended by: Lesleigh Noe on: 05/21/2019 09:45 AM   Modules accepted: Orders

## 2019-05-21 NOTE — Telephone Encounter (Signed)
Pt called back.   Takes his medication daily and before breakfast.   Discussed dose increase.   Noted he was previously on 150 mcg and was as high as 200 mcg when heavier.   Decided to increase to 150 mcg and will need lab appointment for recheck in 6 weeks

## 2019-05-21 NOTE — Telephone Encounter (Signed)
Attempted to call patient to discuss lab result. Got voicemail and asked him to call back.    If he calls back, can try to get me or Anastasiya.   Thyroid level was very high -- this means he his not getting enough thyroid medication.   See if he has missed doses of levothyroxine or if he is not taking it on an empty stomach.   If taking regularly we will need to increase the dose.   Lesleigh Noe

## 2019-06-19 ENCOUNTER — Encounter: Payer: Commercial Managed Care - PPO | Admitting: Family Medicine

## 2019-06-20 ENCOUNTER — Other Ambulatory Visit: Payer: Self-pay

## 2019-06-20 ENCOUNTER — Encounter: Payer: Self-pay | Admitting: Family Medicine

## 2019-06-20 ENCOUNTER — Ambulatory Visit (INDEPENDENT_AMBULATORY_CARE_PROVIDER_SITE_OTHER): Payer: Commercial Managed Care - PPO | Admitting: Family Medicine

## 2019-06-20 VITALS — BP 152/92 | HR 75 | Temp 98.4°F | Resp 18 | Ht 68.0 in | Wt 228.5 lb

## 2019-06-20 DIAGNOSIS — I10 Essential (primary) hypertension: Secondary | ICD-10-CM

## 2019-06-20 DIAGNOSIS — E039 Hypothyroidism, unspecified: Secondary | ICD-10-CM

## 2019-06-20 DIAGNOSIS — Z8349 Family history of other endocrine, nutritional and metabolic diseases: Secondary | ICD-10-CM

## 2019-06-20 DIAGNOSIS — K760 Fatty (change of) liver, not elsewhere classified: Secondary | ICD-10-CM

## 2019-06-20 LAB — CBC
HCT: 44.9 % (ref 39.0–52.0)
Hemoglobin: 15.8 g/dL (ref 13.0–17.0)
MCHC: 35.3 g/dL (ref 30.0–36.0)
MCV: 91.8 fl (ref 78.0–100.0)
Platelets: 237 10*3/uL (ref 150.0–400.0)
RBC: 4.89 Mil/uL (ref 4.22–5.81)
RDW: 13.3 % (ref 11.5–15.5)
WBC: 6.2 10*3/uL (ref 4.0–10.5)

## 2019-06-20 LAB — TSH: TSH: 5.28 u[IU]/mL — ABNORMAL HIGH (ref 0.35–4.50)

## 2019-06-20 MED ORDER — AMLODIPINE BESYLATE 10 MG PO TABS: 10.0000 mg | ORAL_TABLET | Freq: Every day | ORAL | 1 refills | Status: DC

## 2019-06-20 NOTE — Patient Instructions (Addendum)
#Hypertension - increase amlodipine to 10 mg daily #Thyroid - check today #Fatty liver - additional labs today  Preventive Care 49-52 Years Old, Male Preventive care refers to lifestyle choices and visits with your health care provider that can promote health and wellness. This includes:  A yearly physical exam. This is also called an annual well check.  Regular dental and eye exams.  Immunizations.  Screening for certain conditions.  Healthy lifestyle choices, such as eating a healthy diet, getting regular exercise, not using drugs or products that contain nicotine and tobacco, and limiting alcohol use. What can I expect for my preventive care visit? Physical exam Your health care provider will check:  Height and weight. These may be used to calculate body mass index (BMI), which is a measurement that tells if you are at a healthy weight.  Heart rate and blood pressure.  Your skin for abnormal spots. Counseling Your health care provider may ask you questions about:  Alcohol, tobacco, and drug use.  Emotional well-being.  Home and relationship well-being.  Sexual activity.  Eating habits.  Work and work Statistician. What immunizations do I need?  Influenza (flu) vaccine  This is recommended every year. Tetanus, diphtheria, and pertussis (Tdap) vaccine  You may need a Td booster every 10 years. Varicella (chickenpox) vaccine  You may need this vaccine if you have not already been vaccinated. Zoster (shingles) vaccine  You may need this after age 75. Measles, mumps, and rubella (MMR) vaccine  You may need at least one dose of MMR if you were born in 1957 or later. You may also need a second dose. Pneumococcal conjugate (PCV13) vaccine  You may need this if you have certain conditions and were not previously vaccinated. Pneumococcal polysaccharide (PPSV23) vaccine  You may need one or two doses if you smoke cigarettes or if you have certain conditions.  Meningococcal conjugate (MenACWY) vaccine  You may need this if you have certain conditions. Hepatitis A vaccine  You may need this if you have certain conditions or if you travel or work in places where you may be exposed to hepatitis A. Hepatitis B vaccine  You may need this if you have certain conditions or if you travel or work in places where you may be exposed to hepatitis B. Haemophilus influenzae type b (Hib) vaccine  You may need this if you have certain risk factors. Human papillomavirus (HPV) vaccine  If recommended by your health care provider, you may need three doses over 6 months. You may receive vaccines as individual doses or as more than one vaccine together in one shot (combination vaccines). Talk with your health care provider about the risks and benefits of combination vaccines. What tests do I need? Blood tests  Lipid and cholesterol levels. These may be checked every 5 years, or more frequently if you are over 40 years old.  Hepatitis C test.  Hepatitis B test. Screening  Lung cancer screening. You may have this screening every year starting at age 81 if you have a 30-pack-year history of smoking and currently smoke or have quit within the past 15 years.  Prostate cancer screening. Recommendations will vary depending on your family history and other risks.  Colorectal cancer screening. All adults should have this screening starting at age 98 and continuing until age 84. Your health care provider may recommend screening at age 73 if you are at increased risk. You will have tests every 1-10 years, depending on your results and the type of  screening test.  Diabetes screening. This is done by checking your blood sugar (glucose) after you have not eaten for a while (fasting). You may have this done every 1-3 years.  Sexually transmitted disease (STD) testing. Follow these instructions at home: Eating and drinking  Eat a diet that includes fresh fruits and  vegetables, whole grains, lean protein, and low-fat dairy products.  Take vitamin and mineral supplements as recommended by your health care provider.  Do not drink alcohol if your health care provider tells you not to drink.  If you drink alcohol: ? Limit how much you have to 0-2 drinks a day. ? Be aware of how much alcohol is in your drink. In the [place] one drink equals one 12 oz bottle of beer (355 mL), one 5 oz glass of wine (148 mL), or one 1 oz glass of hard liquor (44 mL). Lifestyle  Take daily care of your teeth and gums.  Stay active. Exercise for at least 30 minutes on 5 or more days each week.  Do not use any products that contain nicotine or tobacco, such as cigarettes, e-cigarettes, and chewing tobacco. If you need help quitting, ask your health care provider.  If you are sexually active, practice safe sex. Use a condom or other form of protection to prevent STIs (sexually transmitted infections).  Talk with your health care provider about taking a low-dose aspirin every day starting at age 28. What's next?  Go to your health care provider once a year for a well check visit.  Ask your health care provider how often you should have your eyes and teeth checked.  Stay up to date on all vaccines. This information is not intended to replace advice given to you by your health care provider. Make sure you discuss any questions you have with your health care provider. Document Released: 12/25/2015 Document Revised: 11/22/2018 Document Reviewed: 11/22/2018 Elsevier Patient Education  2020 [employer].

## 2019-06-20 NOTE — Progress Notes (Signed)
Annual Exam   Chief Complaint:  Chief Complaint  Patient presents with  . Annual Exam    History of Present Illness:  Paul Shannon is a 53 y.o. presents today for annual examination.     Nutrition/Lifestyle Diet: cut out bread, soda, drinking water, lean meat, working on weight loss Exercise: not great - yard work 2 times per week, just needs the motivation He is single partner, contraception - IUD.   Social History   Tobacco Use  Smoking Status Never Smoker  Smokeless Tobacco Never Used   Social History   Substance and Sexual Activity  Alcohol Use Yes   Comment: glass of wine 1 time per month   Social History   Substance and Sexual Activity  Drug Use No     Safety The patient wears seatbelts: yes.     The patient feels safe at home and in their relationships: yes.  General Health Dentist in the last year: Yes Eye doctor: yes  Weight Wt Readings from Last 3 Encounters:  06/20/19 228 lb 8 oz (103.6 kg)  05/20/19 227 lb 4 oz (103.1 kg)  08/10/18 177 lb (80.3 kg)   Patient has high BMI  BMI Readings from Last 1 Encounters:  06/20/19 34.74 kg/m     Chronic disease screening Blood pressure monitoring:  BP Readings from Last 3 Encounters:  06/20/19 (!) 152/92  05/20/19 (!) 168/114  11/22/18 (!) 178/115   Checks BP occasionally - 150/75  Lipid Monitoring: Indication for screening: age >35, obesity, diabetes, family hx, CV risk factors.  Lipid screening: Not Indicated  Lab Results  Component Value Date   CHOL 130 05/20/2019   HDL 37.50 (L) 05/20/2019   LDLCALC 63 05/20/2019   TRIG 145.0 05/20/2019   CHOLHDL 3 05/20/2019     Diabetes Screening: age >43, overweight, family hx, PCOS, hx of gestational diabetes, at risk ethnicity, elevated blood pressure >135/80.  Diabetes Screening screening: Not Indicated  No results found for: HGBA1C normal glucose   Prostate Cancer Screening: Not Indicated Age 61-69 yo Shared Decision Making Higher Risk:  Older age, African American, Family Hx of Prostate Cancer - No Benefits: screening may prevent 1.3 deaths from prostate cancer over 13 years per 1000 men screened and prevent 3 metastatic cases per 1000 men screened. Not enough evidence to support more benefit for AA or [place] Harms: False Positive and psychological harms. 15% of me with false positive over a 2 to 4 year period > resulting in biopsy and complications such as pain, hematospermia, infections. Overdiagnosis - increases with age - found that 20-50% of prostate cancer through screening may have never caused any issues. Harms of treatment include - erectile dysfunction, urinary incontinence, and bothersome bowel symptoms.     Colon Cancer Screening - up to date    Immunization History  Administered Date(s) Administered  . Hep A / Hep B 06/26/2012, 07/03/2012, 07/27/2012, 07/31/2013  . Td 03/19/2010    Past Medical History:  Diagnosis Date  . Allergy   . Anxiety state, unspecified yrs ago  . Arthritis    both knees  . Biliary dyskinesia   . Complication of anesthesia    nausea x 1 surgery lapband 3-4 hyrs ago  . Diverticulitis   . Diverticulitis   . 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 Esophageal reflux   . Fatty liver    non problems since lapband surgery  . History of chickenpox   .  History of kidney stones   . Hypertension   . Irritable bowel syndrome   . Nausea & vomiting    at night due to regirgitation, hob elevated sleeps on pillows or recliner  . Overweight(278.02)   . Unspecified hemorrhoids without mention of complication   . Unspecified hypothyroidism   . Vomiting (bilious) following gastrointestinal surgery 08/10/2018    Past Surgical History:  Procedure Laterality Date  . BIOPSY  08/02/2018   Procedure: BIOPSY;  Surgeon: Alphonsa Overall, MD;  Location: [facility];  Service: General;;  . Fidel Levy H PYLORI  10/02/2012   Procedure: Lauris Chroman;  Surgeon: Shann Medal, MD;  Location: [facility];  Service: General;  Laterality: N/A;  . BUNIONECTOMY  2020  . CHOLECYSTECTOMY  05/11/2012  . COLONOSCOPY  2014  . ESOPHAGOGASTRODUODENOSCOPY N/A 08/02/2018   Procedure: UPPER ENDOSCOPY;  Surgeon: Alphonsa Overall, MD;  Location: [facility];  Service: General;  Laterality: N/A;  . KNEE SURGERY  2011   right knee  arthroscopic 2 surgeries  . LAPAROSCOPIC GASTRIC BANDING  12/25/2012   Procedure: LAPAROSCOPIC GASTRIC BANDING;  Surgeon: Shann Medal, MD;  Location: [facility];  Service: General;  Laterality: N/A;  Lap Band Placement  . LIVER BIOPSY    . LIVER BIOPSY  12/25/2012   Procedure: LIVER BIOPSY;  Surgeon: Shann Medal, MD;  Location: [facility];  Service: General;;  . MESH APPLIED TO LAP PORT  12/25/2012   Procedure: MESH APPLIED TO LAP PORT;  Surgeon: Shann Medal, MD;  Location: [facility];  Service: General;;  . TONSILLECTOMY AND ADENOIDECTOMY  as child    Prior to Admission medications   Medication Sig Start Date End Date Taking? Authorizing Provider  amLODipine (NORVASC) 5 MG tablet TAKE 1 TABLET(5 MG) BY MOUTH DAILY 05/20/19  Yes Lesleigh Noe, MD  fluticasone (FLONASE) 50 MCG/ACT nasal spray Place into both nostrils daily.   Yes [provider]  levothyroxine (SYNTHROID) 150 MCG tablet Take 1 tablet (150 mcg total) by mouth daily before breakfast. 05/21/19  Yes Lesleigh Noe, MD  MELATONIN PO Take by mouth. 12 mg 1 daily   Yes [provider]  ALPRAZolam Duanne Moron) 0.5 MG tablet Take 1/2 to 1 tablet by mouth three times daily as needed Patient not taking: Reported on 06/20/2019 08/27/18   Noralee Space, MD  ibuprofen (ADVIL,MOTRIN) 200 MG tablet Take 400 mg by mouth every 8 (eight) hours as needed (for pain.).    [provider]    Allergies  Allergen Reactions  . Niacin     REACTION: pt states INTOL w/ flushing  . Other     Red dye=rash with a tattoo   . Penicillins     REACTION: childhood -  extreme rash  . Red Dye   . Rosuvastatin     REACTION: pt states INTOL     Social History   Socioeconomic History  . Marital status: Married    Spouse name: Joelene Millin  . Number of children: 2   . Years of education: management trainings  . Highest education level: Not on file  Occupational History  . Occupation: Arts administrator: [employer]  . Financial resource strain: Not hard at all  . Food insecurity    Worry: Not on file    Inability: Not on file  . Transportation needs    Medical: Not on file    Non-medical: Not on file  Tobacco  Use  . Smoking status: Never Smoker  . Smokeless tobacco: Never Used  Substance and Sexual Activity  . Alcohol use: Yes    Comment: glass of wine 1 time per month  . Drug use: No  . Sexual activity: Yes    Birth control/protection: I.U.D.  Lifestyle  . Physical activity    Days per week: Not on file    Minutes per session: Not on file  . Stress: Not on file  Relationships  . Social Herbalist on phone: Not on file    Gets together: Not on file    Attends religious service: Not on file    Active member of club or organization: Not on file    Attends meetings of clubs or organizations: Not on file    Relationship status: Not on file  . Intimate partner violence    Fear of current or ex partner: Not on file    Emotionally abused: Not on file    Physically abused: Not on file    Forced sexual activity: Not on file  Other Topics Concern  . Not on file  Social History Narrative   05/20/19   From: [place], moved to [place] 27 years ago   Living: with [name]   Work: [employer]      Family: one daughter 8 yo with grandson and 62 year old son who lives with him      Enjoys: spend time outside, hunting, yard work      Exercise: not since foot injury, was doing boot camps    Diet: not great, planning to make a change today, will stay away from sugary beverages      Safety   Seat belts: Yes     Guns: Yes    Safe in relationships: Yes     Family History  Problem Relation Age of Onset  . Kidney cancer Father   . Hyperlipidemia Father   . Cirrhosis Father        fatty liver  . Diabetes Mother   . Arthritis Mother   . Hyperlipidemia Mother   . Heart disease Maternal Grandfather   . Diabetes Maternal Grandfather   . Thyroid disease Sister   . Arthritis Sister   . COPD Sister   . Cirrhosis Sister        due to fatty liver  . Kidney disease Maternal Grandmother   . Early death Paternal Grandfather        killed  . Other Brother        fatty liver  . Hyperlipidemia Brother   . Other Brother        fatty liver  . Colon cancer Neg Hx   . Esophageal cancer Neg Hx   . Stomach cancer Neg Hx   . Rectal cancer Neg Hx     Review of Systems  Constitutional: Negative for chills and fever.  HENT: Positive for congestion. Negative for sore throat.   Eyes: Negative for blurred vision and double vision.  Respiratory: Negative for shortness of breath.   Cardiovascular: Negative for chest pain.  Gastrointestinal: Negative for heartburn, nausea and vomiting.  Genitourinary: Negative.   Musculoskeletal: Negative.  Negative for myalgias.  Skin: Negative for rash.  Neurological: Negative for dizziness and headaches.  Endo/Heme/Allergies: Does not bruise/bleed easily.  Psychiatric/Behavioral: Negative for depression. The patient is not nervous/anxious.      Physical Exam BP (!) 152/92   Pulse 75   Temp 98.4 F (36.9  C)   Resp 18   Ht 5\' 8"  (1.727 m)   Wt 228 lb 8 oz (103.6 kg)   BMI 34.74 kg/m    BP Readings from Last 3 Encounters:  06/20/19 (!) 152/92  05/20/19 (!) 168/114  11/22/18 (!) 178/115      Physical Exam Constitutional:      General: He is not in acute distress.    Appearance: He is well-developed. He is not diaphoretic.  HENT:     Head: Normocephalic and atraumatic.     Right Ear: Tympanic membrane and ear canal normal.     Left Ear: Tympanic  membrane and ear canal normal.     Nose: Nose normal.     Mouth/Throat:     Pharynx: Uvula midline.  Eyes:     General: No scleral icterus.    Conjunctiva/sclera: Conjunctivae normal.     Pupils: Pupils are equal, round, and reactive to light.  Neck:     Musculoskeletal: Normal range of motion and neck supple.  Cardiovascular:     Rate and Rhythm: Normal rate and regular rhythm.     Heart sounds: Normal heart sounds. No murmur.  Pulmonary:     Effort: Pulmonary effort is normal. No respiratory distress.     Breath sounds: Normal breath sounds. No wheezing.  Abdominal:     General: Bowel sounds are normal. There is no distension.     Palpations: Abdomen is soft. There is no mass.     Tenderness: There is no abdominal tenderness. There is no guarding.  Musculoskeletal: Normal range of motion.  Lymphadenopathy:     Cervical: No cervical adenopathy.  Skin:    General: Skin is warm and dry.     Capillary Refill: Capillary refill takes less than 2 seconds.  Neurological:     Mental Status: He is alert and oriented to person, place, and time.        Results: PHQ-9:    Office Visit from 05/20/2019 in [place] at [facility]  PHQ-9 Total Score  4        Assessment: 53 y.o. No obstetric history on file. male here for routine annual physical examination.  Plan: Problem List Items Addressed This Visit      Cardiovascular and Mediastinum   Essential hypertension - Primary   Relevant Medications   amLODipine (NORVASC) 10 MG tablet     Digestive   Hepatic steatosis   Relevant Orders   CBC     Endocrine   Hypothyroidism   Relevant Orders   TSH      Screening: -- Blood pressure screen - elevated, increase amlodipine -- cholesterol screening: not due for screening -- Weight screening: overweight: continue to monitor -- Diabetes Screening: not due for screening -- Nutrition: normal  The 10-year ASCVD risk score Mikey Bussing DC Jr., et al., 2013) is: 5.1%    Values used to calculate the score:     Age: 38 years     Sex: Male     Is Non-Hispanic African American: No     Diabetic: No     Tobacco smoker: No     Systolic Blood Pressure: 165 mmHg     Is BP treated: Yes     HDL Cholesterol: 37.5 mg/dL     Total Cholesterol: 130 mg/dL  -- ASA 81 mg discussed if CVD risk >10% age 62-59 and willing to take for 10 years -- Statin therapy for Age 83-75 with CVD risk >7.5%  Psych -- Depression  screening (PHQ-9): negative  Safety -- tobacco screening: not using -- alcohol screening: low-risk usage. -- no evidence of domestic violence or intimate partner violence.  Cancer Screening -- Prostate not indicated -- Colon up to date -- Lung not indicated   Immunizations -- flu vaccine - advised getting in the fall, pt does not get typically -- TDAP q10 years up to date   Lesleigh Noe

## 2019-09-13 ENCOUNTER — Encounter: Payer: Self-pay | Admitting: Family Medicine

## 2019-09-13 DIAGNOSIS — Z8349 Family history of other endocrine, nutritional and metabolic diseases: Secondary | ICD-10-CM

## 2019-09-17 NOTE — Addendum Note (Signed)
Addended by: Ellamae Sia on: 09/17/2019 09:24 AM   Modules accepted: Orders

## 2019-09-18 ENCOUNTER — Other Ambulatory Visit: Payer: Self-pay

## 2019-09-18 ENCOUNTER — Other Ambulatory Visit (INDEPENDENT_AMBULATORY_CARE_PROVIDER_SITE_OTHER): Payer: Commercial Managed Care - PPO

## 2019-09-18 DIAGNOSIS — Z8349 Family history of other endocrine, nutritional and metabolic diseases: Secondary | ICD-10-CM | POA: Diagnosis not present

## 2019-09-18 DIAGNOSIS — E039 Hypothyroidism, unspecified: Secondary | ICD-10-CM

## 2019-09-18 LAB — IBC + FERRITIN
Ferritin: 29.2 ng/mL (ref 22.0–322.0)
Iron: 97 ug/dL (ref 42–165)
Saturation Ratios: 21.4 % (ref 20.0–50.0)
Transferrin: 324 mg/dL (ref 212.0–360.0)

## 2019-09-18 LAB — TSH: TSH: 42.03 u[IU]/mL — ABNORMAL HIGH (ref 0.35–4.50)

## 2019-09-19 ENCOUNTER — Other Ambulatory Visit: Payer: Self-pay | Admitting: Primary Care

## 2019-09-19 DIAGNOSIS — E039 Hypothyroidism, unspecified: Secondary | ICD-10-CM

## 2019-10-08 ENCOUNTER — Other Ambulatory Visit: Payer: Self-pay

## 2019-10-08 NOTE — Addendum Note (Signed)
Addended by: Lesleigh Noe on: 10/08/2019 08:20 AM   Modules accepted: Orders

## 2019-10-08 NOTE — Progress Notes (Signed)
Reordering TSH

## 2019-10-08 NOTE — Addendum Note (Signed)
Addended by: Ellamae Sia on: 10/08/2019 11:26 AM   Modules accepted: Orders

## 2020-01-10 ENCOUNTER — Telehealth: Payer: Self-pay | Admitting: *Deleted

## 2020-01-10 NOTE — Telephone Encounter (Signed)
Called patient and left voicemail for him to call the office and schedule an appt.

## 2020-01-10 NOTE — Telephone Encounter (Signed)
Pt called states he had foot surgery with Dr. Paulla Dolly about 1 year ago and his having a bump with sharp pain in the incision site.

## 2020-01-10 NOTE — Telephone Encounter (Signed)
I informed pt that on occasion the pin in a surgery would foot would back out and he needed an appt to come in to discuss treatment options with Dr. Paulla Dolly. Pt states understanding and I transferred to schedulers.

## 2020-01-15 ENCOUNTER — Other Ambulatory Visit: Payer: Self-pay

## 2020-01-15 ENCOUNTER — Ambulatory Visit (INDEPENDENT_AMBULATORY_CARE_PROVIDER_SITE_OTHER): Payer: Commercial Managed Care - PPO | Admitting: Podiatry

## 2020-01-15 ENCOUNTER — Encounter: Payer: Self-pay | Admitting: Podiatry

## 2020-01-15 ENCOUNTER — Ambulatory Visit (INDEPENDENT_AMBULATORY_CARE_PROVIDER_SITE_OTHER): Payer: Commercial Managed Care - PPO

## 2020-01-15 VITALS — Temp 97.2°F

## 2020-01-15 DIAGNOSIS — Z472 Encounter for removal of internal fixation device: Secondary | ICD-10-CM | POA: Diagnosis not present

## 2020-01-15 DIAGNOSIS — M2012 Hallux valgus (acquired), left foot: Secondary | ICD-10-CM

## 2020-01-15 NOTE — Progress Notes (Signed)
Subjective:   Patient ID: Paul Shannon, male   DOB: 54 y.o.   MRN: [id number]   HPI Patient states all of a sudden he started feel something sharp on top of his foot and he does not remember injury.  States he is doing very well from the bunion surgery but this is been sore   ROS      Objective:  Physical Exam  Neurovascular status intact with inflammation pain dorsum left foot in the proximal portion the incision site with a prominenc indicating possible dorsal pin movement.  Good digital perfusion well oriented x3     Assessment:  Abnormal pin position left first metatarsal     Plan:  8 H&P reviewed condition and recommended pin removal.  Explained procedure risk and patient wants procedure and this will be done in the office and we reviewed x-ray he then signed consent form scheduled for office outpatient surgery.  Patient encouraged to call with any questions he may have  X-rays indicate that the pin is more proximal and more prominent in the first metatarsal left and probably has moved in a dorsal direction

## 2020-01-17 ENCOUNTER — Telehealth: Payer: Self-pay | Admitting: Podiatry

## 2020-01-17 NOTE — Telephone Encounter (Addendum)
OFFICE SURGERY DATE OF SERVICE: 01/29/2020  SURGICAL PROCEDURE: Removal Fixation Deep Kwire/Screw (Pin) from Top of Foot [id number]  [facility] Effective: 03/13/2019  Deductible is $3,500 with $3,500 met and $0 remaining. Out of Pocket is $6,000 with $6,000 met and $0 remaining. CoInsurance is 80% / 20%     100% since deductible and out of pocket have been met.  Per Park Breed V no prior authorization is required. Call ref# [id number].

## 2020-01-21 ENCOUNTER — Encounter: Payer: Self-pay | Admitting: Family Medicine

## 2020-01-21 ENCOUNTER — Ambulatory Visit (INDEPENDENT_AMBULATORY_CARE_PROVIDER_SITE_OTHER): Payer: Commercial Managed Care - PPO | Admitting: Family Medicine

## 2020-01-21 ENCOUNTER — Other Ambulatory Visit: Payer: Self-pay

## 2020-01-21 DIAGNOSIS — E7800 Pure hypercholesterolemia, unspecified: Secondary | ICD-10-CM | POA: Diagnosis not present

## 2020-01-21 DIAGNOSIS — R600 Localized edema: Secondary | ICD-10-CM | POA: Diagnosis not present

## 2020-01-21 DIAGNOSIS — E039 Hypothyroidism, unspecified: Secondary | ICD-10-CM | POA: Diagnosis not present

## 2020-01-21 DIAGNOSIS — I10 Essential (primary) hypertension: Secondary | ICD-10-CM | POA: Diagnosis not present

## 2020-01-21 DIAGNOSIS — K760 Fatty (change of) liver, not elsewhere classified: Secondary | ICD-10-CM

## 2020-01-21 LAB — POCT URINALYSIS DIPSTICK
Bilirubin, UA: NEGATIVE
Blood, UA: NEGATIVE
Glucose, UA: NEGATIVE
Ketones, UA: POSITIVE
Leukocytes, UA: NEGATIVE
Nitrite, UA: NEGATIVE
Protein, UA: POSITIVE — AB
Spec Grav, UA: 1.025 (ref 1.010–1.025)
Urobilinogen, UA: 0.2 E.U./dL
pH, UA: 6 (ref 5.0–8.0)

## 2020-01-21 MED ORDER — LISINOPRIL 10 MG PO TABS: 10.0000 mg | ORAL_TABLET | Freq: Every day | ORAL | 3 refills | Status: DC

## 2020-01-21 NOTE — Assessment & Plan Note (Signed)
Labs today given new edema. If worsening will plan for [place] to evaluate liver.

## 2020-01-21 NOTE — Assessment & Plan Note (Signed)
Dose increase in 09/2019 with TSH >40. Repeat today to re-evaluate.

## 2020-01-21 NOTE — Progress Notes (Addendum)
Subjective:     Paul Shannon is a 54 y.o. male presenting for Leg Swelling (left leg and left foot mainly. x 1 month)     HPI   #Left leg swelling and right - tingling and numbness in the left foot - had a bunion and pins placed in this foot - seeing podiatry and getting this treated - swelling is on both sides - when he elevates his legs - the swelling improves - feels like his blood flow is poor - endorses chronic knee pain - no hip pain - no blood in urine/stool - sinuses have been dry and bleeding - using neti pot - endorses mild SOB  - normal HR  #hypothyroidism - increased to 150 mcg in October - endorses just "not feeling well" - over the last 6 weeks - is falling asleep whenever he rests   Review of Systems   Social History   Tobacco Use  Smoking Status Never Smoker  Smokeless Tobacco Never Used        Objective:    BP Readings from Last 3 Encounters:  01/21/20 (!) 148/94  06/20/19 (!) 152/92  05/20/19 (!) 168/114   Wt Readings from Last 3 Encounters:  01/21/20 239 lb 8 oz (108.6 kg)  06/20/19 228 lb 8 oz (103.6 kg)  05/20/19 227 lb 4 oz (103.1 kg)    BP (!) 148/94   Pulse 86   Temp (!) 97.3 F (36.3 C)   Resp 18   Ht 5\' 8"  (1.727 m)   Wt 239 lb 8 oz (108.6 kg)   SpO2 97%   BMI 36.42 kg/m    Physical Exam Constitutional:      Appearance: Normal appearance. He is not ill-appearing or diaphoretic.  HENT:     Right Ear: External ear normal.     Left Ear: External ear normal.     Nose: Nose normal.  Eyes:     General: No scleral icterus.    Extraocular Movements: Extraocular movements intact.     Conjunctiva/sclera: Conjunctivae normal.  Neck:     Vascular: Hepatojugular reflux and JVD (mildly elevated) present.  Cardiovascular:     Rate and Rhythm: Normal rate and regular rhythm.     Heart sounds: No murmur.  Pulmonary:     Effort: Pulmonary effort is normal. No respiratory distress.     Breath sounds: Normal breath sounds.  No wheezing or rales.  Musculoskeletal:     Cervical back: Neck supple.     Right lower leg: Edema present.     Left lower leg: Edema present.     Comments: Edema: 1+ to knee  Skin:    General: Skin is warm and dry.  Neurological:     Mental Status: He is alert. Mental status is at baseline.  Psychiatric:        Mood and Affect: Mood normal.    UA: 1+ protein       Assessment & Plan:   Problem List Items Addressed This Visit      Cardiovascular and Mediastinum   Essential hypertension    BP elevated. Add lisinopril. Continue amlodipine      Relevant Medications   lisinopril (ZESTRIL) 10 MG tablet   Other Relevant Orders   Comprehensive metabolic panel     Digestive   Hepatic steatosis    Labs today given new edema. If worsening will plan for [place] to evaluate liver.       Relevant Orders   Comprehensive metabolic  panel     Endocrine   Hypothyroidism    Dose increase in 09/2019 with TSH >40. Repeat today to re-evaluate.       Relevant Orders   TSH     Other   HYPERCHOLESTEROLEMIA   Relevant Medications   lisinopril (ZESTRIL) 10 MG tablet   Bilateral lower extremity edema    Elevated JVP concerning for possible CHF. However, pt with known liver disease and with protein on urine. Will get blood work to evaluate for possible. Compression stockings now. Pending blood work plan for additional imaging. Elevate legs.       Relevant Orders   Comprehensive metabolic panel   Brain natriuretic peptide   CBC with Differential   POCT urinalysis dipstick (Completed)       Return in about 4 weeks (around 02/18/2020).  Lesleigh Noe, MD

## 2020-01-21 NOTE — Assessment & Plan Note (Signed)
Elevated JVP concerning for possible CHF. However, pt with known liver disease and with protein on urine. Will get blood work to evaluate for possible. Compression stockings now. Pending blood work plan for additional imaging. Elevate legs.

## 2020-01-21 NOTE — Assessment & Plan Note (Signed)
BP elevated. Add lisinopril. Continue amlodipine

## 2020-01-21 NOTE — Patient Instructions (Addendum)
#  Leg swelling - wear compression socks - elevate your legs as much as possible  #blood pressure - start lisinopril 10 mg daily

## 2020-01-22 ENCOUNTER — Other Ambulatory Visit: Payer: Self-pay | Admitting: Family Medicine

## 2020-01-22 ENCOUNTER — Encounter: Payer: Self-pay | Admitting: Family Medicine

## 2020-01-22 DIAGNOSIS — R7989 Other specified abnormal findings of blood chemistry: Secondary | ICD-10-CM

## 2020-01-22 DIAGNOSIS — K760 Fatty (change of) liver, not elsewhere classified: Secondary | ICD-10-CM

## 2020-01-22 DIAGNOSIS — E039 Hypothyroidism, unspecified: Secondary | ICD-10-CM

## 2020-01-22 DIAGNOSIS — R600 Localized edema: Secondary | ICD-10-CM

## 2020-01-22 LAB — CBC WITH DIFFERENTIAL/PLATELET
Basophils Absolute: 0.1 10*3/uL (ref 0.0–0.1)
Basophils Relative: 1.6 % (ref 0.0–3.0)
Eosinophils Absolute: 0.2 10*3/uL (ref 0.0–0.7)
Eosinophils Relative: 2.4 % (ref 0.0–5.0)
HCT: 42.1 % (ref 39.0–52.0)
Hemoglobin: 15 g/dL (ref 13.0–17.0)
Lymphocytes Relative: 37.5 % (ref 12.0–46.0)
Lymphs Abs: 2.8 10*3/uL (ref 0.7–4.0)
MCHC: 35.7 g/dL (ref 30.0–36.0)
MCV: 91 fl (ref 78.0–100.0)
Monocytes Absolute: 0.8 10*3/uL (ref 0.1–1.0)
Monocytes Relative: 11.1 % (ref 3.0–12.0)
Neutro Abs: 3.6 10*3/uL (ref 1.4–7.7)
Neutrophils Relative %: 47.4 % (ref 43.0–77.0)
Platelets: 296 10*3/uL (ref 150.0–400.0)
RBC: 4.62 Mil/uL (ref 4.22–5.81)
RDW: 13.1 % (ref 11.5–15.5)
WBC: 7.6 10*3/uL (ref 4.0–10.5)

## 2020-01-22 LAB — COMPREHENSIVE METABOLIC PANEL
ALT: 104 U/L — ABNORMAL HIGH (ref 0–53)
AST: 79 U/L — ABNORMAL HIGH (ref 0–37)
Albumin: 4.5 g/dL (ref 3.5–5.2)
Alkaline Phosphatase: 87 U/L (ref 39–117)
BUN: 13 mg/dL (ref 6–23)
CO2: 28 mEq/L (ref 19–32)
Calcium: 9.3 mg/dL (ref 8.4–10.5)
Chloride: 100 mEq/L (ref 96–112)
Creatinine, Ser: 0.98 mg/dL (ref 0.40–1.50)
GFR: 80.01 mL/min (ref >60.00)
Glucose, Bld: 96 mg/dL (ref 70–99)
Potassium: 3.1 mEq/L — ABNORMAL LOW (ref 3.5–5.1)
Sodium: 138 mEq/L (ref 135–145)
Total Bilirubin: 0.9 mg/dL (ref 0.2–1.2)
Total Protein: 7.6 g/dL (ref 6.0–8.3)

## 2020-01-22 LAB — BRAIN NATRIURETIC PEPTIDE: Pro B Natriuretic peptide (BNP): 42 pg/mL (ref 0.0–100.0)

## 2020-01-22 LAB — TSH: TSH: 9.35 u[IU]/mL — ABNORMAL HIGH (ref 0.35–4.50)

## 2020-01-22 MED ORDER — LEVOTHYROXINE SODIUM 175 MCG PO TABS: 175.0000 ug | ORAL_TABLET | Freq: Every day | ORAL | 1 refills | Status: DC

## 2020-01-22 NOTE — Progress Notes (Signed)
MyChart to patient regarding changes and orders

## 2020-01-23 NOTE — Addendum Note (Signed)
Addended by: Lesleigh Noe on: 01/23/2020 12:44 PM   Modules accepted: Orders

## 2020-01-29 ENCOUNTER — Other Ambulatory Visit: Payer: Self-pay

## 2020-01-29 ENCOUNTER — Encounter: Payer: Self-pay | Admitting: Podiatry

## 2020-01-29 ENCOUNTER — Ambulatory Visit (INDEPENDENT_AMBULATORY_CARE_PROVIDER_SITE_OTHER): Payer: Commercial Managed Care - PPO | Admitting: Podiatry

## 2020-01-29 VITALS — BP 154/94 | HR 67 | Temp 97.9°F | Resp 16

## 2020-01-29 DIAGNOSIS — Z472 Encounter for removal of internal fixation device: Secondary | ICD-10-CM

## 2020-01-29 NOTE — Progress Notes (Signed)
Subjective:   Patient ID: Paul Shannon, male   DOB: 54 y.o.   MRN: [id number]   HPI Patient presents for removal of pin from the left first metatarsal that is been sore and making shoe gear difficult   ROS      Objective:  Physical Exam  Neurovascular status intact with painful prominent pin proximal first metatarsal left     Assessment:  Proximal pin position first metatarsal left     Plan:  Patient was anesthetized 60 mg like Marcaine mixture and brought to the OR.  Patient's foot was prepped and draped utilizing standard aseptic technique and ankle tourniquet inflated to 250 mmHg.  Following procedure was performed.  Attention was directed to the dorsal aspect left foot where a 3 cm incision was made over the metatarsal shaft.  It was taken down to capsule with hemostasis being acquired as necessary and further the capsular tissue were sharply dissected off the underlying bone.  Utilizing sharp and blunt dissection the pin was identified and then removed in toto wound flushed copious master Garamycin solution and sutured with 4-0 nylon.  Sterile dressing applied tourniquet released capillary fill noted be immediate to all digits on the left foot and patient left the OR in satisfactory condition

## 2020-01-30 ENCOUNTER — Other Ambulatory Visit: Payer: Commercial Managed Care - PPO

## 2020-01-31 ENCOUNTER — Other Ambulatory Visit: Payer: Commercial Managed Care - PPO

## 2020-02-06 ENCOUNTER — Encounter: Payer: Self-pay | Admitting: Family Medicine

## 2020-02-06 ENCOUNTER — Ambulatory Visit
Admission: RE | Admit: 2020-02-06 | Discharge: 2020-02-06 | Disposition: A | Discharge status: Home / Self Care | Payer: Commercial Managed Care - PPO | Source: Ambulatory Visit | Attending: Family Medicine | Admitting: Family Medicine

## 2020-02-06 DIAGNOSIS — R7989 Other specified abnormal findings of blood chemistry: Secondary | ICD-10-CM

## 2020-02-06 DIAGNOSIS — K760 Fatty (change of) liver, not elsewhere classified: Secondary | ICD-10-CM

## 2020-02-06 DIAGNOSIS — R600 Localized edema: Secondary | ICD-10-CM

## 2020-02-12 ENCOUNTER — Encounter: Payer: Self-pay | Admitting: Podiatry

## 2020-02-12 ENCOUNTER — Ambulatory Visit (INDEPENDENT_AMBULATORY_CARE_PROVIDER_SITE_OTHER): Payer: Commercial Managed Care - PPO

## 2020-02-12 ENCOUNTER — Ambulatory Visit (INDEPENDENT_AMBULATORY_CARE_PROVIDER_SITE_OTHER): Payer: Commercial Managed Care - PPO | Admitting: Podiatry

## 2020-02-12 ENCOUNTER — Other Ambulatory Visit: Payer: Self-pay

## 2020-02-12 VITALS — Temp 97.7°F

## 2020-02-12 DIAGNOSIS — M2012 Hallux valgus (acquired), left foot: Secondary | ICD-10-CM | POA: Diagnosis not present

## 2020-02-12 DIAGNOSIS — Z472 Encounter for removal of internal fixation device: Secondary | ICD-10-CM

## 2020-02-12 NOTE — Progress Notes (Signed)
Subjective:   Patient ID: Paul Shannon, male   DOB: 54 y.o.   MRN: [id number]   HPI Patient presents stating I am feeling better and it seems to resolve my problem   ROS      Objective:  Physical Exam  Neurovascular status intact with stitches in place left first metatarsal wound edges well coapted no drainage     Assessment:  Doing well post pin removal left metatarsal shaft     Plan:  Stitches removed sterile dressing applied instructed on wider based shoes and patient will be seen back as needed should heal uneventfully  X-rays indicate the osteotomy has healed well and there is no pathology noted current

## 2020-03-06 IMAGING — RF DG UGI W/ HIGH DENSITY W/KUB
3 series · 12 of 12 positions shown · non-contrast
Comparison: None.

CLINICAL DATA: Dysphagia.  History of lap band.

EXAM:
UPPER GI SERIES WITH KUB
TECHNIQUE: After obtaining a scout radiograph an upper GI series was performed
using high density barium.
FLUOROSCOPY TIME:  Fluoroscopy Time:  1 minutes 48 seconds
Radiation Exposure Index (if provided by the fluoroscopic device):
184 mGy

[Series 1: one shot · 0.14mm/px · 2 of 2 slices shown (1 of 2)]
[im 1/2]
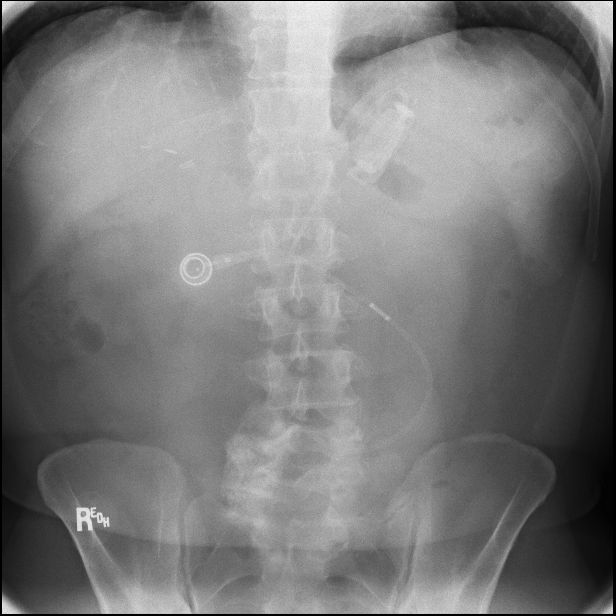
[im 2/2]
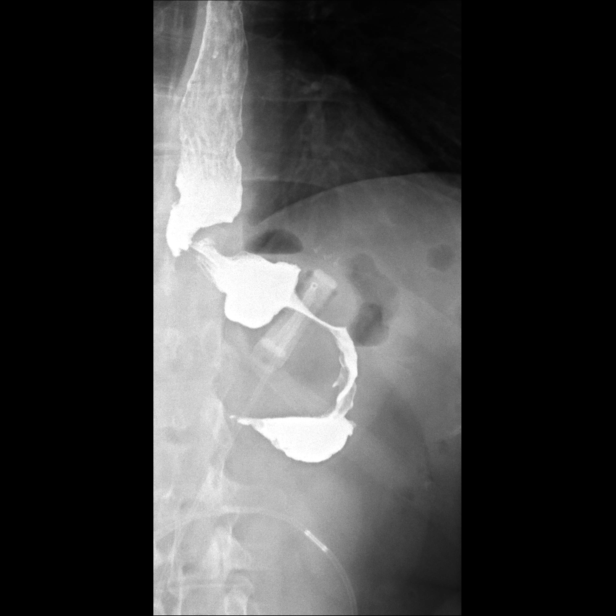

[Series 2: sequence · 4 of 20 frames shown]
[frame 2/20]
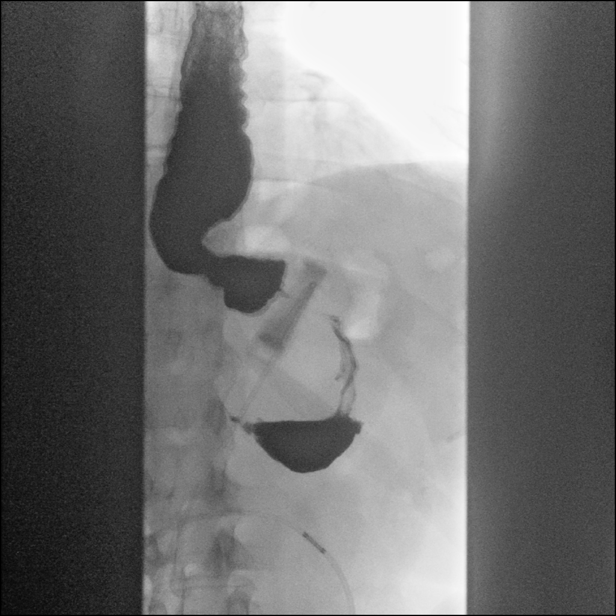
[frame 4/20]
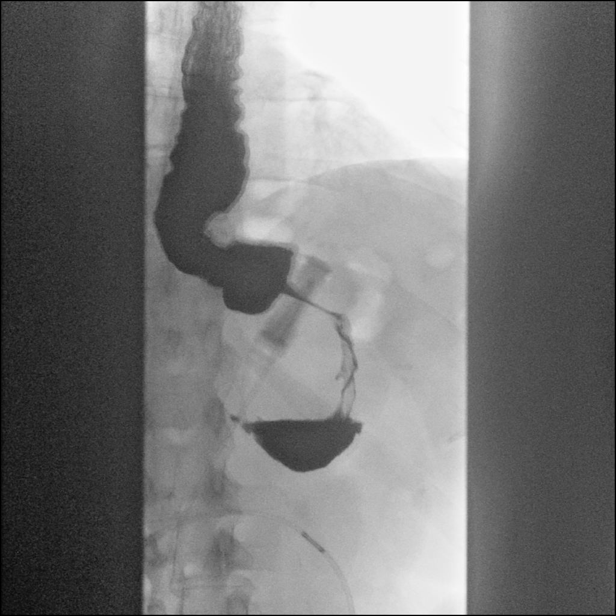
[frame 11/20]
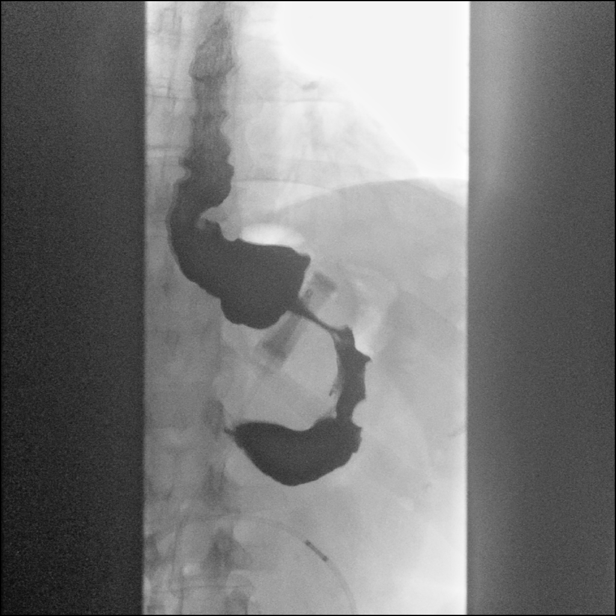
[frame 18/20]
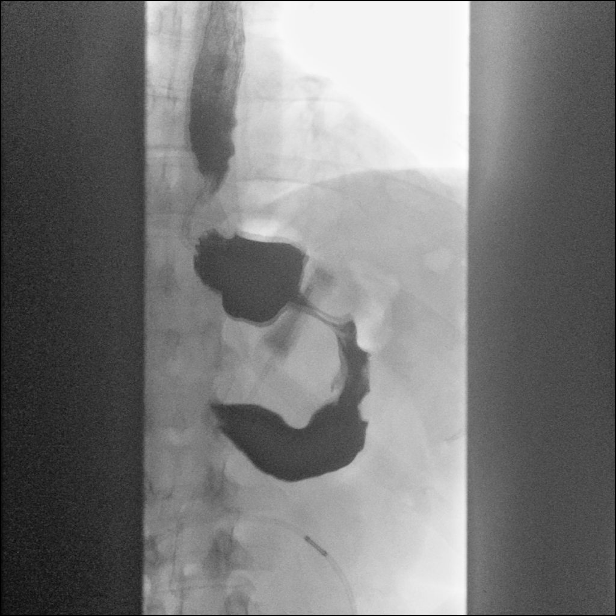

[Series 3: one shot · 0.14mm/px · 6 of 6 slices shown (2 of 2)]
[im 1/6]
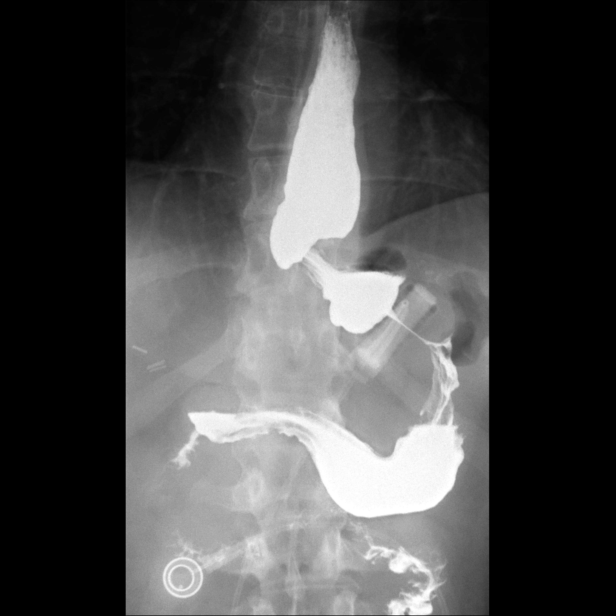
[im 2/6]
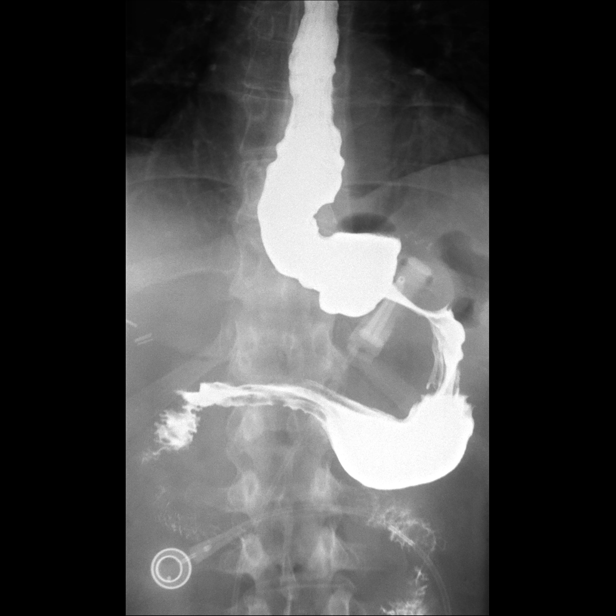
[im 3/6]
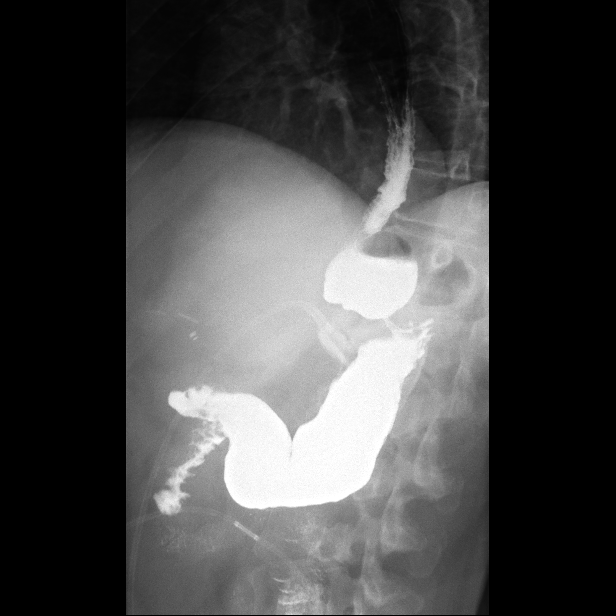
[im 4/6]
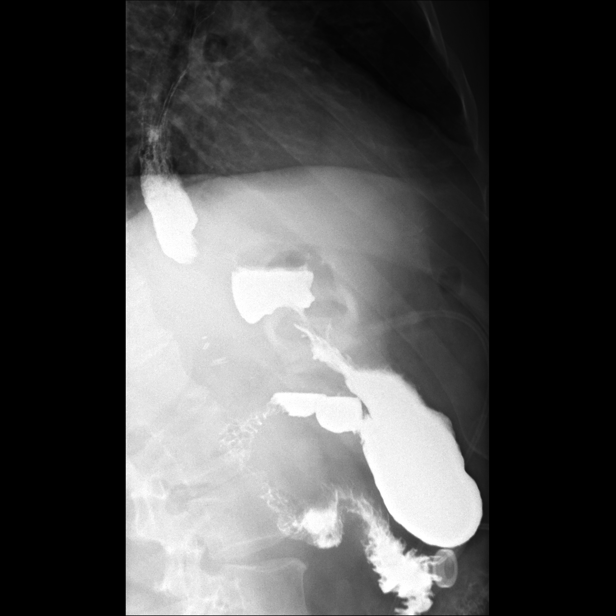
[im 5/6]
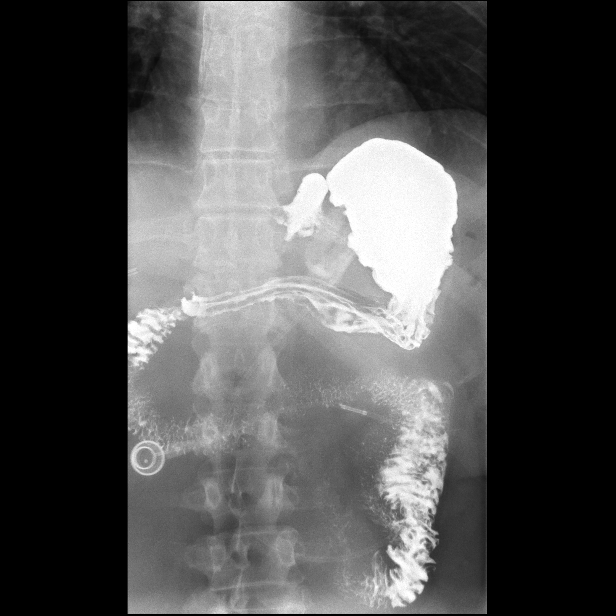
[im 6/6]
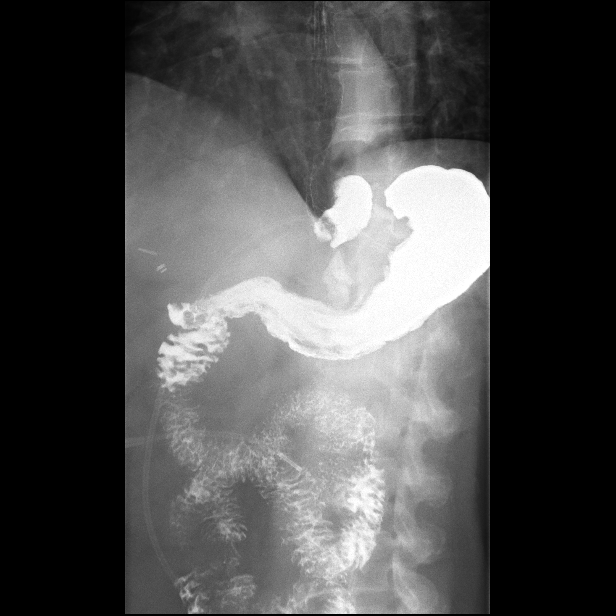

[12 of 12 positions shown; findings below may reference images not displayed]

FINDINGS: The KUB demonstrates a normal bowel gas pattern. Lap band appears in
good position. Surgical clips from previous cholecystectomy.
Degenerative facet arthritis in the right side of the lower lumbar
spine.

I observed the patient drinking high-density barium in the upright
position. There is marked narrowing of the barium column at the
level of the lap band but barium did pass through that area. I
recorded a rapid sequence of images to demonstrate the persistence
of the narrowing.

The barium then passed through the remainder of the stomach and into
the normal appearing duodenum. A small amount of contrast remained
in the gastric fundus above the lap band.
IMPRESSION: Persistent narrowing of the gastric lumen at the level of the lap
band as described, even though the patient reports that the fluid
has been removed from the band. There is some retention of contrast
in the gastric fundus at the completion of the study.

## 2020-05-20 ENCOUNTER — Encounter: Payer: Self-pay | Admitting: Family Medicine

## 2020-08-03 ENCOUNTER — Ambulatory Visit (INDEPENDENT_AMBULATORY_CARE_PROVIDER_SITE_OTHER): Payer: Commercial Managed Care - PPO | Admitting: Family Medicine

## 2020-08-03 ENCOUNTER — Encounter: Payer: Self-pay | Admitting: Family Medicine

## 2020-08-03 ENCOUNTER — Other Ambulatory Visit: Payer: Self-pay

## 2020-08-03 VITALS — BP 120/80 | HR 80 | Temp 98.0°F | Ht 68.0 in | Wt 217.0 lb

## 2020-08-03 DIAGNOSIS — E039 Hypothyroidism, unspecified: Secondary | ICD-10-CM

## 2020-08-03 DIAGNOSIS — E7800 Pure hypercholesterolemia, unspecified: Secondary | ICD-10-CM | POA: Diagnosis not present

## 2020-08-03 DIAGNOSIS — K760 Fatty (change of) liver, not elsewhere classified: Secondary | ICD-10-CM | POA: Diagnosis not present

## 2020-08-03 DIAGNOSIS — I10 Essential (primary) hypertension: Secondary | ICD-10-CM

## 2020-08-03 DIAGNOSIS — Z0000 Encounter for general adult medical examination without abnormal findings: Secondary | ICD-10-CM | POA: Diagnosis not present

## 2020-08-03 DIAGNOSIS — Z131 Encounter for screening for diabetes mellitus: Secondary | ICD-10-CM

## 2020-08-03 MED ORDER — LOSARTAN POTASSIUM 50 MG PO TABS: 50.0000 mg | ORAL_TABLET | Freq: Every day | ORAL | 3 refills | Status: DC

## 2020-08-03 NOTE — Progress Notes (Signed)
Annual Exam   Chief Complaint:  Chief Complaint  Patient presents with  . Annual Exam  . Cough    consistenly since May, post covid vaccine    History of Present Illness:  Paul Shannon is a 54 y.o. presents today for annual examination.    #HTN - only taking lisinopril 10 mg - 140/85 - no cp, vision changes, breathing difficulty   Nutrition/Lifestyle Diet: healthy diet - limits sugars, smaller portions Exercise: walks 1-2 miles daily, occasional jogging/hiking He is not sexually active.    Social History   Tobacco Use  Smoking Status Never Smoker  Smokeless Tobacco Never Used   Social History   Substance and Sexual Activity  Alcohol Use Yes   Comment: glass of wine 1 time per month   Social History   Substance and Sexual Activity  Drug Use No     Safety The patient wears seatbelts: yes.     The patient feels safe at home and in their relationships: yes.  General Health Dentist in the last year: Yes Eye doctor: yes  Weight Wt Readings from Last 3 Encounters:  08/03/20 217 lb (98.4 kg)  01/21/20 239 lb 8 oz (108.6 kg)  06/20/19 228 lb 8 oz (103.6 kg)   Patient has high BMI  BMI Readings from Last 1 Encounters:  08/03/20 32.99 kg/m     Chronic disease screening Blood pressure monitoring:  BP Readings from Last 3 Encounters:  08/03/20 120/80  01/29/20 (!) 154/94  01/21/20 (!) 148/94    Lipid Monitoring: Indication for screening: age >35, obesity, diabetes, family hx, CV risk factors.  Lipid screening: Yes  Lab Results  Component Value Date   CHOL 130 05/20/2019   HDL 37.50 (L) 05/20/2019   LDLCALC 63 05/20/2019   TRIG 145.0 05/20/2019   CHOLHDL 3 05/20/2019     Diabetes Screening: age >16, overweight, family hx, PCOS, hx of gestational diabetes, at risk ethnicity, elevated blood pressure >135/80.  Diabetes Screening screening: Yes  No results found for: HGBA1C   Prostate Cancer Screening: Not Indicated Age 41-69 yo Shared  Decision Making Higher Risk: Older age, African American, Family Hx of Prostate Cancer - No Benefits: screening may prevent 1.3 deaths from prostate cancer over 13 years per 1000 men screened and prevent 3 metastatic cases per 1000 men screened. Not enough evidence to support more benefit for AA or [place] Harms: False Positive and psychological harms. 15% of me with false positive over a 2 to 4 year period > resulting in biopsy and complications such as pain, hematospermia, infections. Overdiagnosis - increases with age - found that 20-50% of prostate cancer through screening may have never caused any issues. Harms of treatment include - erectile dysfunction, urinary incontinence, and bothersome bowel symptoms.   After discussion he does not want to get a PSA checked today.   Inadequate evidence for screening <55 No mortality benefit for screening >70   Lab Results  Component Value Date   PSA 1.15 04/23/2018   PSA 1.41 04/06/2017   PSA 0.94 04/14/2016       Colon Cancer Screening:  Age 29-75 yo - benefits outweigh the risk. Adults 9-85 yo who have never been screened benefit.  Benefits: 134000 people in 2016 will be diagnosed and 49,000 will die - early detection helps Harms: Complications 2/2 to colonoscopy High Risk (Colonoscopy): genetic disorder (Lynch syndrome or familial adenomatous polyposis), personal hx of IBD, previous adenomatous polyp, or previous colorectal cancer, FamHx start 10 years before  the age at diagnosis, increased in males and black race  Options:  FIT - looks for hemoglobin (blood in the stool) - specific and fairly sensitive - must be done annually Cologuard - looks for DNA and blood - more sensitive - therefore can have more false positives, every 3 years Colonoscopy - every 10 years if normal - sedation, bowl prep, must have someone drive you  Shared decision making and the patient had decided to do colonoscopy - not due until next year.   Social History    Tobacco Use  Smoking Status Never Smoker  Smokeless Tobacco Never Used     Past Medical History:  Diagnosis Date  . Allergy   . Anxiety state, unspecified yrs ago  . Arthritis    both knees  . Biliary dyskinesia   . Complication of anesthesia    nausea x 1 surgery lapband 3-4 hyrs ago  . Diverticulitis   . Diverticulitis   . 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 Esophageal reflux   . Fatty liver    non problems since lapband surgery  . History of chickenpox   . History of kidney stones   . Hypertension   . Irritable bowel syndrome   . Nausea & vomiting    at night due to regirgitation, hob elevated sleeps on pillows or recliner  . Overweight(278.02)   . Unspecified hemorrhoids without mention of complication   . Unspecified hypothyroidism   . Vomiting (bilious) following gastrointestinal surgery 08/10/2018    Past Surgical History:  Procedure Laterality Date  . BIOPSY  08/02/2018   Procedure: BIOPSY;  Surgeon: Alphonsa Overall, MD;  Location: [facility];  Service: General;;  . Fidel Levy H PYLORI  10/02/2012   Procedure: Lauris Chroman;  Surgeon: Shann Medal, MD;  Location: [facility];  Service: General;  Laterality: N/A;  . BUNIONECTOMY  2020  . CHOLECYSTECTOMY  05/11/2012  . COLONOSCOPY  2014  . ESOPHAGOGASTRODUODENOSCOPY N/A 08/02/2018   Procedure: UPPER ENDOSCOPY;  Surgeon: Alphonsa Overall, MD;  Location: [facility];  Service: General;  Laterality: N/A;  . KNEE SURGERY  2011   right knee  arthroscopic 2 surgeries  . LAPAROSCOPIC GASTRIC BANDING  12/25/2012   Procedure: LAPAROSCOPIC GASTRIC BANDING;  Surgeon: Shann Medal, MD;  Location: [facility];  Service: General;  Laterality: N/A;  Lap Band Placement  . LIVER BIOPSY    . LIVER BIOPSY  12/25/2012   Procedure: LIVER BIOPSY;  Surgeon: Shann Medal, MD;  Location: [facility];  Service: General;;  . MESH APPLIED TO LAP PORT  12/25/2012   Procedure: MESH  APPLIED TO LAP PORT;  Surgeon: Shann Medal, MD;  Location: [facility];  Service: General;;  . TONSILLECTOMY AND ADENOIDECTOMY  as child    Prior to Admission medications   Medication Sig Start Date End Date Taking? Authorizing Provider  fluticasone (FLONASE) 50 MCG/ACT nasal spray Place into both nostrils daily.   Yes [provider]  levothyroxine (SYNTHROID) 175 MCG tablet Take 1 tablet (175 mcg total) by mouth daily before breakfast. 01/22/20  Yes Lesleigh Noe, MD  lisinopril (ZESTRIL) 10 MG tablet Take 1 tablet (10 mg total) by mouth daily. 01/21/20  Yes Lesleigh Noe, MD  MELATONIN PO Take by mouth as needed. 12 mg 1 daily    Yes [provider]    Allergies  Allergen Reactions  . Niacin     REACTION: pt  states INTOL w/ flushing  . Other     Red dye=rash with a tattoo   . Penicillins     REACTION: childhood - extreme rash  . Red Dye   . Rosuvastatin     REACTION: pt states INTOL     Social History   Socioeconomic History  . Marital status: Legally Separated    Spouse name: Not on file  . Number of children: 2   . Years of education: management trainings  . Highest education level: Not on file  Occupational History  . Occupation: Arts administrator: [employer]  Tobacco Use  . Smoking status: Never Smoker  . Smokeless tobacco: Never Used  Vaping Use  . Vaping Use: Never used  Substance and Sexual Activity  . Alcohol use: Yes    Comment: glass of wine 1 time per month  . Drug use: No  . Sexual activity: Yes    Birth control/protection: I.U.D.  Other Topics Concern  . Not on file  Social History Narrative   08/03/20   From: [place], moved to [place] 27 years ago   Living: with his son (getting divorced from wife   Work: Advice worker      Family: one daughter Mel Almond with grandson  and Reva Bores (2004) who lives with him      Enjoys: spend time outside, hunting, yard work      Exercise: not since foot injury, was doing boot camps    Diet:  not great, planning to make a change today, will stay away from sugary beverages      Safety   Seat belts: Yes    Guns: Yes    Safe in relationships: Yes    Social Determinants of Health   Financial Resource Strain:   . Difficulty of Paying Living Expenses: Not on file  Food Insecurity:   . Worried About Charity fundraiser in the Last Year: Not on file  . Ran Out of Food in the Last Year: Not on file  Transportation Needs:   . Lack of Transportation (Medical): Not on file  . Lack of Transportation (Non-Medical): Not on file  Physical Activity:   . Days of Exercise per Week: Not on file  . Minutes of Exercise per Session: Not on file  Stress:   . Feeling of Stress : Not on file  Social Connections:   . Frequency of Communication with Friends and Family: Not on file  . Frequency of Social Gatherings with Friends and Family: Not on file  . Attends Religious Services: Not on file  . Active Member of Clubs or Organizations: Not on file  . Attends Archivist Meetings: Not on file  . Marital Status: Not on file  Intimate Partner Violence:   . Fear of Current or Ex-Partner: Not on file  . Emotionally Abused: Not on file  . Physically Abused: Not on file  . Sexually Abused: Not on file    Family History  Problem Relation Age of Onset  . Kidney cancer Father   . Hyperlipidemia Father   . Cirrhosis Father        fatty liver  . Diabetes Mother   . Arthritis Mother   . Hyperlipidemia Mother   . Heart disease Maternal Grandfather   . Diabetes Maternal Grandfather   . Thyroid disease Sister   . Arthritis Sister   . COPD Sister   . Cirrhosis Sister        due to  fatty liver  . Kidney disease Maternal Grandmother   . Early death Paternal Grandfather        killed  . Other Brother        fatty liver  . Hyperlipidemia Brother   . Other Brother        fatty liver  . Colon cancer Neg Hx   . Esophageal cancer Neg Hx   . Stomach cancer Neg Hx   . Rectal cancer Neg  Hx     Review of Systems  Constitutional: Negative for chills and fever.  HENT: Negative for congestion and sore throat.   Eyes: Negative for blurred vision and double vision.  Respiratory: Positive for cough. Negative for shortness of breath.   Cardiovascular: Negative for chest pain.  Gastrointestinal: Negative for heartburn, nausea and vomiting.  Genitourinary: Negative.   Musculoskeletal: Negative.  Negative for myalgias.  Skin: Negative for rash.  Neurological: Negative for dizziness and headaches.  Endo/Heme/Allergies: Does not bruise/bleed easily.  Psychiatric/Behavioral: Negative for depression. The patient is not nervous/anxious.      Physical Exam BP 120/80   Pulse 80   Temp 98 F (36.7 C) (Temporal)   Ht 5\' 8"  (1.727 m)   Wt 217 lb (98.4 kg)   SpO2 96%   BMI 32.99 kg/m    BP Readings from Last 3 Encounters:  08/03/20 120/80  01/29/20 (!) 154/94  01/21/20 (!) 148/94      Physical Exam Constitutional:      General: He is not in acute distress.    Appearance: He is well-developed. He is not diaphoretic.  HENT:     Head: Normocephalic and atraumatic.     Right Ear: Tympanic membrane and ear canal normal.     Left Ear: Tympanic membrane and ear canal normal.     Nose: Nose normal.     Mouth/Throat:     Pharynx: Uvula midline.  Eyes:     General: No scleral icterus.    Conjunctiva/sclera: Conjunctivae normal.     Pupils: Pupils are equal, round, and reactive to light.  Cardiovascular:     Rate and Rhythm: Normal rate and regular rhythm.     Heart sounds: Normal heart sounds. No murmur heard.   Pulmonary:     Effort: Pulmonary effort is normal. No respiratory distress.     Breath sounds: Normal breath sounds. No wheezing.  Abdominal:     General: Bowel sounds are normal. There is no distension.     Palpations: Abdomen is soft. There is no mass.     Tenderness: There is no abdominal tenderness. There is no guarding.  Musculoskeletal:        General:  Normal range of motion.     Cervical back: Normal range of motion and neck supple.  Lymphadenopathy:     Cervical: No cervical adenopathy.  Skin:    General: Skin is warm and dry.     Capillary Refill: Capillary refill takes less than 2 seconds.  Neurological:     Mental Status: He is alert and oriented to person, place, and time.  Psychiatric:        Mood and Affect: Mood normal.        Results:  PHQ-9:    Office Visit from 08/03/2020 in [place] at [facility] Total Score 4        Assessment: 54 y.o. here for routine annual physical examination.  Plan: Problem List Items Addressed This Visit      Cardiovascular  and Mediastinum   Essential hypertension    Chronic cough. Will switch to losartan      Relevant Medications   losartan (COZAAR) 50 MG tablet   Other Relevant Orders   Comprehensive metabolic panel     Digestive   Hepatic steatosis     Endocrine   Hypothyroidism   Relevant Orders   TSH     Other   HYPERCHOLESTEROLEMIA   Relevant Medications   losartan (COZAAR) 50 MG tablet   Other Relevant Orders   Lipid panel    Other Visit Diagnoses    Annual physical exam    -  Primary   Screening for diabetes mellitus       Relevant Orders   Hemoglobin A1c      Screening: -- Blood pressure screen at goal, but switching medication -- cholesterol screening: will obtain -- Weight screening: overweight: continue to monitor -- Diabetes Screening: will obtain -- Nutrition: normal - Encouraged healthy diet  The 10-year ASCVD risk score Mikey Bussing DC Jr., et al., 2013) is: 3.7%   Values used to calculate the score:     Age: 17 years     Sex: Male     Is Non-Hispanic African American: No     Diabetic: No     Tobacco smoker: No     Systolic Blood Pressure: 169 mmHg     Is BP treated: Yes     HDL Cholesterol: 37.5 mg/dL     Total Cholesterol: 130 mg/dL  -- ASA 81 mg discussed if CVD risk >10% age 79-59 and willing to take for 10  years -- Statin therapy for Age 58-75 with CVD risk >7.5%  Psych -- Depression screening (PHQ-9): negative  Safety -- tobacco screening: not using -- alcohol screening: \ low-risk usage. -- no evidence of domestic violence or intimate partner violence.  Cancer Screening -- Prostate (age 45-69) not indicated -- Colon (age 83-75) due next year -- Lung not indicated   Immunizations Immunization History  Administered Date(s) Administered  . Hep A / Hep B 06/26/2012, 07/03/2012, 07/27/2012, 07/31/2013  . Moderna SARS-COVID-2 Vaccination 03/12/2020, 04/09/2020  . Td 03/19/2010    -- flu vaccine - encouraged -- TDAP q10 years up to date -- Shingles (age >29) - not interested at this time -- Covid-19 Vaccine up to date  Encouraged regular vision and dental screening. Encouraged healthy exercise and diet.   Lesleigh Noe

## 2020-08-03 NOTE — Assessment & Plan Note (Signed)
Chronic cough. Will switch to losartan

## 2020-08-03 NOTE — Patient Instructions (Signed)
Stop Lisinopril  Start Losartan   MyChart in 2 weeks with blood pressure readings  And whether or not you are still coughing  Please check your blood pressure 2-4 times a week.   To check your blood pressure 1) Sit in a quiet and relaxed place for 5 minutes 2) Make sure your feet are flat on the ground 3) Consider checking first thing in the morning   Normal blood pressure is less than 140/90 Ideally you blood pressure should be around 120/80

## 2020-08-04 ENCOUNTER — Encounter: Payer: Self-pay | Admitting: Family Medicine

## 2020-08-04 ENCOUNTER — Other Ambulatory Visit: Payer: Self-pay | Admitting: Family Medicine

## 2020-08-04 DIAGNOSIS — I10 Essential (primary) hypertension: Secondary | ICD-10-CM

## 2020-08-04 LAB — LIPID PANEL
Cholesterol: 89 mg/dL (ref 0–200)
HDL: 35.9 mg/dL — ABNORMAL LOW (ref >39.00)
LDL Cholesterol: 42 mg/dL (ref 0–99)
NonHDL: 53.03
Total CHOL/HDL Ratio: 2
Triglycerides: 53 mg/dL (ref 0.0–149.0)
VLDL: 10.6 mg/dL (ref 0.0–40.0)

## 2020-08-04 LAB — COMPREHENSIVE METABOLIC PANEL
ALT: 90 U/L — ABNORMAL HIGH (ref 0–53)
AST: 61 U/L — ABNORMAL HIGH (ref 0–37)
Albumin: 4.7 g/dL (ref 3.5–5.2)
Alkaline Phosphatase: 71 U/L (ref 39–117)
BUN: 21 mg/dL (ref 6–23)
CO2: 27 mEq/L (ref 19–32)
Calcium: 9.6 mg/dL (ref 8.4–10.5)
Chloride: 98 mEq/L (ref 96–112)
Creatinine, Ser: 1 mg/dL (ref 0.40–1.50)
GFR: 78.01 mL/min (ref >60.00)
Glucose, Bld: 77 mg/dL (ref 70–99)
Potassium: 4.2 mEq/L (ref 3.5–5.1)
Sodium: 135 mEq/L (ref 135–145)
Total Bilirubin: 1.6 mg/dL — ABNORMAL HIGH (ref 0.2–1.2)
Total Protein: 7.6 g/dL (ref 6.0–8.3)

## 2020-08-04 LAB — TSH: TSH: 0.38 u[IU]/mL (ref 0.35–4.50)

## 2020-08-04 LAB — HEMOGLOBIN A1C: Hgb A1c MFr Bld: 5.1 % (ref 4.6–6.5)

## 2020-08-19 ENCOUNTER — Other Ambulatory Visit: Payer: Self-pay | Admitting: Family Medicine

## 2020-08-19 DIAGNOSIS — E039 Hypothyroidism, unspecified: Secondary | ICD-10-CM

## 2021-01-14 ENCOUNTER — Other Ambulatory Visit: Payer: Self-pay | Admitting: Family Medicine

## 2021-01-14 DIAGNOSIS — I10 Essential (primary) hypertension: Secondary | ICD-10-CM

## 2021-01-15 ENCOUNTER — Encounter: Payer: Self-pay | Admitting: Family Medicine

## 2021-01-18 ENCOUNTER — Telehealth: Payer: Commercial Managed Care - PPO | Admitting: Physician Assistant

## 2021-01-18 DIAGNOSIS — B9689 Other specified bacterial agents as the cause of diseases classified elsewhere: Secondary | ICD-10-CM | POA: Diagnosis not present

## 2021-01-18 DIAGNOSIS — J019 Acute sinusitis, unspecified: Secondary | ICD-10-CM

## 2021-01-18 MED ORDER — DOXYCYCLINE HYCLATE 100 MG PO CAPS: 100.0000 mg | ORAL_CAPSULE | Freq: Two times a day (BID) | ORAL | 0 refills | Status: DC

## 2021-01-18 NOTE — Progress Notes (Signed)

## 2021-01-18 NOTE — Progress Notes (Signed)
I have spent 5 minutes in review of e-visit questionnaire, review and updating patient chart, medical decision making and response to patient.   Aylani Spurlock Cody Lanai Conlee, PA-C    

## 2021-01-25 ENCOUNTER — Encounter: Payer: Self-pay | Admitting: Family Medicine

## 2021-01-26 ENCOUNTER — Other Ambulatory Visit: Payer: Self-pay

## 2021-01-26 ENCOUNTER — Ambulatory Visit (INDEPENDENT_AMBULATORY_CARE_PROVIDER_SITE_OTHER): Payer: Commercial Managed Care - PPO | Admitting: Family Medicine

## 2021-01-26 VITALS — BP 136/94 | HR 81 | Temp 98.0°F | Resp 16 | Wt 201.0 lb

## 2021-01-26 DIAGNOSIS — I10 Essential (primary) hypertension: Secondary | ICD-10-CM

## 2021-01-26 DIAGNOSIS — R5383 Other fatigue: Secondary | ICD-10-CM | POA: Diagnosis not present

## 2021-01-26 DIAGNOSIS — E039 Hypothyroidism, unspecified: Secondary | ICD-10-CM

## 2021-01-26 DIAGNOSIS — G4700 Insomnia, unspecified: Secondary | ICD-10-CM | POA: Insufficient documentation

## 2021-01-26 LAB — CBC
HCT: 43.4 % (ref 39.0–52.0)
Hemoglobin: 15.3 g/dL (ref 13.0–17.0)
MCHC: 35.4 g/dL (ref 30.0–36.0)
MCV: 93.4 fl (ref 78.0–100.0)
Platelets: 221 10*3/uL (ref 150.0–400.0)
RBC: 4.65 Mil/uL (ref 4.22–5.81)
RDW: 14.1 % (ref 11.5–15.5)
WBC: 6.5 10*3/uL (ref 4.0–10.5)

## 2021-01-26 LAB — COMPREHENSIVE METABOLIC PANEL
ALT: 34 U/L (ref 0–53)
AST: 43 U/L — ABNORMAL HIGH (ref 0–37)
Albumin: 4.7 g/dL (ref 3.5–5.2)
Alkaline Phosphatase: 74 U/L (ref 39–117)
BUN: 16 mg/dL (ref 6–23)
CO2: 32 mEq/L (ref 19–32)
Calcium: 10 mg/dL (ref 8.4–10.5)
Chloride: 99 mEq/L (ref 96–112)
Creatinine, Ser: 1.09 mg/dL (ref 0.40–1.50)
GFR: 77.22 mL/min (ref >60.00)
Glucose, Bld: 81 mg/dL (ref 70–99)
Potassium: 4.3 mEq/L (ref 3.5–5.1)
Sodium: 137 mEq/L (ref 135–145)
Total Bilirubin: 1.4 mg/dL — ABNORMAL HIGH (ref 0.2–1.2)
Total Protein: 7.9 g/dL (ref 6.0–8.3)

## 2021-01-26 LAB — TSH: TSH: 62.29 u[IU]/mL — ABNORMAL HIGH (ref 0.35–4.50)

## 2021-01-26 LAB — VITAMIN D 25 HYDROXY (VIT D DEFICIENCY, FRACTURES): VITD: 30.36 ng/mL (ref 30.00–100.00)

## 2021-01-26 MED ORDER — AMLODIPINE BESYLATE 5 MG PO TABS: 5.0000 mg | ORAL_TABLET | Freq: Every day | ORAL | 3 refills | Status: DC

## 2021-01-26 MED ORDER — TRAZODONE HCL 50 MG PO TABS: 25.0000 mg | ORAL_TABLET | Freq: Every evening | ORAL | 3 refills | Status: DC | PRN

## 2021-01-26 NOTE — Progress Notes (Signed)
Subjective:     Paul Shannon is a 55 y.o. male presenting for Insomnia (Hard time falling asleep and then wakes up around 3 or 3:30 am, fatigue. All started about 3 weeks ago. He has tried Melatonin )     HPI  #early wakening - going through a divorce  - started about 4-5 months ago - initially was hard, then started feeling really good - was working out and riding a bicycle daily - has had successful weight loss and doing well - pel[place] bike - prior to 3 weeks ago lays down: 10 pm, fall asleep 11pm, wake up 5 am  3 weeks  Lay down: 7:30-8pm Asleep: 8:30 pm Waking up: 3 am  Endorsing daytime sleepiness and fatigue during riding His exercise is decreased as well   Feels like his energy has been drained over the last 2-3 weeks -stress at work and with wife  #HTN - bp at home has been elevated 130/90s - when doing well and exercising his bp was controlled - he stopped the losartan 50 mg for a period of time but restarted this   Review of Systems  Constitutional: Positive for fatigue. Negative for chills, diaphoresis and fever.  Respiratory: Negative for cough and shortness of breath.   Cardiovascular: Negative for chest pain, palpitations and leg swelling.  Gastrointestinal: Negative for blood in stool, constipation and diarrhea.  Endocrine: Positive for cold intolerance. Negative for heat intolerance.  Genitourinary: Negative.   Neurological: Negative for weakness.   01/25/2021: Mychart - 3 weeks of feeling exhausted - going through a divorce and not sleeping  Social History   Tobacco Use  Smoking Status Never Smoker  Smokeless Tobacco Never Used        Objective:    BP Readings from Last 3 Encounters:  01/26/21 (!) 136/94  08/03/20 120/80  01/29/20 (!) 154/94   Wt Readings from Last 3 Encounters:  01/26/21 201 lb (91.2 kg)  08/03/20 217 lb (98.4 kg)  01/21/20 239 lb 8 oz (108.6 kg)    BP (!) 136/94   Pulse 81   Temp 98 F (36.7 C)   Resp 16    Wt 201 lb (91.2 kg)   SpO2 98%   BMI 30.56 kg/m    Physical Exam Constitutional:      Appearance: Normal appearance. He is not ill-appearing or diaphoretic.  HENT:     Right Ear: External ear normal.     Left Ear: External ear normal.     Nose: Nose normal.  Eyes:     General: No scleral icterus.    Extraocular Movements: Extraocular movements intact.     Conjunctiva/sclera: Conjunctivae normal.  Cardiovascular:     Rate and Rhythm: Normal rate.     Heart sounds: No murmur heard.   Pulmonary:     Effort: Pulmonary effort is normal.  Musculoskeletal:     Cervical back: Neck supple.  Skin:    General: Skin is warm and dry.  Neurological:     Mental Status: He is alert. Mental status is at baseline.  Psychiatric:        Mood and Affect: Mood normal.        Assessment & Plan:   Problem List Items Addressed This Visit      Cardiovascular and Mediastinum   Essential hypertension - Primary    BP elevated in setting of stopping amlodipine. Will restart amlodipine 5 mg. Cont losartan 50 mg. Return 4 weeks for rechec  Relevant Medications   sildenafil (REVATIO) 20 MG tablet   amLODipine (NORVASC) 5 MG tablet   Other Relevant Orders   Comprehensive metabolic panel     Endocrine   Hypothyroidism    Last TSH normal. New fatigue, will reassess. Cont levothyroxine 175 mcg      Relevant Orders   TSH     Other   Insomnia    Difficulty staying asleep and now with fatigue. Discussed trial of trazodone to help with sleep. Return 4 weeks for check in      Relevant Medications   traZODone (DESYREL) 50 MG tablet   Other fatigue    Daytime sleepiness suspect this is sleep related, but will get blood work to rule out other causes. If no improvement with sleep/blood work will consider sleep apnea referral - hx of snoring.       Relevant Orders   CBC   TSH   Vitamin D, 25-hydroxy       Return in about 4 weeks (around 02/23/2021) for sleep and fatigue if not  improved.  Lesleigh Noe, MD  This visit occurred during the SARS-CoV-2 public health emergency.  Safety protocols were in place, including screening questions prior to the visit, additional usage of staff PPE, and extensive cleaning of exam room while observing appropriate contact time as indicated for disinfecting solutions.

## 2021-01-26 NOTE — Assessment & Plan Note (Signed)
Difficulty staying asleep and now with fatigue. Discussed trial of trazodone to help with sleep. Return 4 weeks for check in

## 2021-01-26 NOTE — Patient Instructions (Addendum)
#  Fatigue - blood work today - if normal and no improvement with sleep - will consider sleep apnea referral  #Insomnia - can try shifting back your clock by 10-15 minutes daily - if ineffective - trazodone to help keep you asleep   #Blood pressure - restart Amlodipine 5 mg - Continue Losartan 50 mg   Voltaren Gel - topical NSAID   Curcumin or Tumeric - for joint pain  Research has shown that Curcumin (Tumeric) supplements are just as good as high dose anti-inflammatory medications (like diclofenac and ibuprofen) at treating pain from osteoarthritis without the side effects.   Take Curcumin 500 mg Three times daily   -- You can find a bottle at [employer] for $8 which should last a month -- Arnold is around $9 and is available at [employer]

## 2021-01-26 NOTE — Assessment & Plan Note (Signed)
BP elevated in setting of stopping amlodipine. Will restart amlodipine 5 mg. Cont losartan 50 mg. Return 4 weeks for rechec

## 2021-01-26 NOTE — Assessment & Plan Note (Signed)
Daytime sleepiness suspect this is sleep related, but will get blood work to rule out other causes. If no improvement with sleep/blood work will consider sleep apnea referral - hx of snoring.

## 2021-01-26 NOTE — Assessment & Plan Note (Signed)
Last TSH normal. New fatigue, will reassess. Cont levothyroxine 175 mcg

## 2021-02-02 ENCOUNTER — Telehealth: Payer: Self-pay | Admitting: Family Medicine

## 2021-02-02 ENCOUNTER — Other Ambulatory Visit: Payer: Self-pay | Admitting: Family Medicine

## 2021-02-02 DIAGNOSIS — E039 Hypothyroidism, unspecified: Secondary | ICD-10-CM

## 2021-02-02 NOTE — Telephone Encounter (Signed)
Called patient and LVM to call back. Pt needs to schedule lab only visit in 2 weeks.

## 2021-02-02 NOTE — Progress Notes (Signed)
Pt with abnormal TSH in setting of OTC vitamin supplementation.   Ordering repeat TSH to check to make sure it is returning to normal.   Please call pt to schedule lab appointment to check TSH in 2 weeks. Glad he is feeling better off his vitamins.

## 2021-02-17 ENCOUNTER — Other Ambulatory Visit: Payer: Self-pay | Admitting: Family Medicine

## 2021-02-17 DIAGNOSIS — E039 Hypothyroidism, unspecified: Secondary | ICD-10-CM

## 2021-02-17 NOTE — Telephone Encounter (Signed)
Pharmacy requests refill on: Levothyroxine 175 mcg   LAST REFILL: 08/19/2020 (Q-90, R-1) LAST OV: 01/26/2021 NEXT OV: 02/25/2021 PHARMACY: [id number] [place], [place]  TSH (01/26/2021): 62.29

## 2021-02-25 ENCOUNTER — Ambulatory Visit (INDEPENDENT_AMBULATORY_CARE_PROVIDER_SITE_OTHER): Payer: Commercial Managed Care - PPO | Admitting: Family Medicine

## 2021-02-25 ENCOUNTER — Encounter: Payer: Self-pay | Admitting: Family Medicine

## 2021-02-25 ENCOUNTER — Other Ambulatory Visit: Payer: Self-pay

## 2021-02-25 VITALS — BP 142/78 | HR 82 | Temp 96.9°F | Wt 202.0 lb

## 2021-02-25 DIAGNOSIS — R0981 Nasal congestion: Secondary | ICD-10-CM | POA: Diagnosis not present

## 2021-02-25 DIAGNOSIS — E039 Hypothyroidism, unspecified: Secondary | ICD-10-CM | POA: Diagnosis not present

## 2021-02-25 DIAGNOSIS — I10 Essential (primary) hypertension: Secondary | ICD-10-CM | POA: Diagnosis not present

## 2021-02-25 MED ORDER — LOSARTAN POTASSIUM 100 MG PO TABS: 100.0000 mg | ORAL_TABLET | Freq: Every day | ORAL | 3 refills | Status: DC

## 2021-02-25 NOTE — Assessment & Plan Note (Signed)
Initial improvement with abx but symptoms have returned. He will try alternative allergy pill and continue saline rinses.

## 2021-02-25 NOTE — Patient Instructions (Addendum)
Blood pressure - increase Losartan to 100 mg daily (2 tablets) - If blood pressure still high, increase amlodipine to 10 mg (2 tablets)    I will send in losartan 100 mg tablet as well   Return 2 weeks for thyroid check  Mychart in 2-4 weeks with home blood pressure

## 2021-02-25 NOTE — Assessment & Plan Note (Signed)
BP improved. He notes he has been taking pseudoephdrine. He will stop and check home BP. If elevated increase losartan to 100 mg daily. Cont amlodipine 5 mg.

## 2021-02-25 NOTE — Progress Notes (Signed)
Subjective:     Paul Shannon is a 55 y.o. male presenting for Follow-up (4 week f/u)     HPI   #Fatigue - stopped supplements  - exercising daily   #HTN - checking at home - 135-140/89-90 - taking amlodipine 5 mg and losartan 50 mg - no cp, sob, ha, vision changes  #sinus congestion - sinus pressure - trying sudafed and saline rinse - had abx in 2/7 - started allegra   Review of Systems  01/26/21: Clinic - HTN -  restart amlodipine 5 mg. cont losartan 50 mg. Hypothyroid - otc vitamins stopped.   Social History   Tobacco Use  Smoking Status Never Smoker  Smokeless Tobacco Never Used        Objective:    BP Readings from Last 3 Encounters:  02/25/21 (!) 142/78  01/26/21 (!) 136/94  08/03/20 120/80   Wt Readings from Last 3 Encounters:  02/25/21 202 lb (91.6 kg)  01/26/21 201 lb (91.2 kg)  08/03/20 217 lb (98.4 kg)    BP (!) 142/78 (BP Location: Right Arm, Patient Position: Sitting, Cuff Size: Normal)   Pulse 82   Temp (!) 96.9 F (36.1 C) (Temporal)   Wt 202 lb (91.6 kg)   SpO2 96%   BMI 30.71 kg/m    Physical Exam Constitutional:      Appearance: Normal appearance. He is not ill-appearing or diaphoretic.  HENT:     Right Ear: External ear normal.     Left Ear: External ear normal.     Nose: Nose normal.  Eyes:     General: No scleral icterus.    Extraocular Movements: Extraocular movements intact.     Conjunctiva/sclera: Conjunctivae normal.  Cardiovascular:     Rate and Rhythm: Normal rate and regular rhythm.     Heart sounds: No murmur heard.   Pulmonary:     Effort: Pulmonary effort is normal. No respiratory distress.     Breath sounds: Normal breath sounds. No wheezing.  Musculoskeletal:     Cervical back: Neck supple.  Skin:    General: Skin is warm and dry.  Neurological:     Mental Status: He is alert. Mental status is at baseline.  Psychiatric:        Mood and Affect: Mood normal.           Assessment & Plan:    Problem List Items Addressed This Visit      Cardiovascular and Mediastinum   Essential hypertension    BP improved. He notes he has been taking pseudoephdrine. He will stop and check home BP. If elevated increase losartan to 100 mg daily. Cont amlodipine 5 mg.       Relevant Medications   losartan (COZAAR) 100 MG tablet     Respiratory   Sinus congestion    Initial improvement with abx but symptoms have returned. He will try alternative allergy pill and continue saline rinses.         Endocrine   Hypothyroidism - Primary    Symptoms improved. Suspect drug interaction with absorption. Repeat TSH in 2 weeks. Cont levo 175 mcg      Relevant Orders   TSH       Return in about 5 months (around 07/28/2021) for annual exam.  Lesleigh Noe, MD  This visit occurred during the SARS-CoV-2 public health emergency.  Safety protocols were in place, including screening questions prior to the visit, additional usage of staff PPE, and extensive cleaning of  exam room while observing appropriate contact time as indicated for disinfecting solutions.

## 2021-02-25 NOTE — Assessment & Plan Note (Signed)
Symptoms improved. Suspect drug interaction with absorption. Repeat TSH in 2 weeks. Cont levo 175 mcg

## 2021-03-10 ENCOUNTER — Other Ambulatory Visit: Payer: Self-pay

## 2021-03-10 ENCOUNTER — Other Ambulatory Visit (INDEPENDENT_AMBULATORY_CARE_PROVIDER_SITE_OTHER): Payer: Commercial Managed Care - PPO

## 2021-03-10 DIAGNOSIS — E039 Hypothyroidism, unspecified: Secondary | ICD-10-CM | POA: Diagnosis not present

## 2021-03-10 LAB — TSH: TSH: 3.52 u[IU]/mL (ref 0.35–4.50)

## 2021-05-17 ENCOUNTER — Other Ambulatory Visit: Payer: Self-pay | Admitting: Family Medicine

## 2021-05-17 DIAGNOSIS — E039 Hypothyroidism, unspecified: Secondary | ICD-10-CM

## 2021-08-23 ENCOUNTER — Encounter: Payer: Self-pay | Admitting: Family Medicine

## 2021-08-23 ENCOUNTER — Ambulatory Visit (INDEPENDENT_AMBULATORY_CARE_PROVIDER_SITE_OTHER): Payer: Commercial Managed Care - PPO | Admitting: Family Medicine

## 2021-08-23 ENCOUNTER — Other Ambulatory Visit: Payer: Self-pay

## 2021-08-23 VITALS — BP 110/88 | HR 81 | Temp 97.0°F | Ht 68.0 in | Wt 210.0 lb

## 2021-08-23 DIAGNOSIS — Z1211 Encounter for screening for malignant neoplasm of colon: Secondary | ICD-10-CM | POA: Diagnosis not present

## 2021-08-23 DIAGNOSIS — E039 Hypothyroidism, unspecified: Secondary | ICD-10-CM | POA: Diagnosis not present

## 2021-08-23 DIAGNOSIS — K760 Fatty (change of) liver, not elsewhere classified: Secondary | ICD-10-CM

## 2021-08-23 DIAGNOSIS — E7800 Pure hypercholesterolemia, unspecified: Secondary | ICD-10-CM

## 2021-08-23 DIAGNOSIS — Z0000 Encounter for general adult medical examination without abnormal findings: Secondary | ICD-10-CM | POA: Diagnosis not present

## 2021-08-23 DIAGNOSIS — Z23 Encounter for immunization: Secondary | ICD-10-CM

## 2021-08-23 DIAGNOSIS — I10 Essential (primary) hypertension: Secondary | ICD-10-CM | POA: Diagnosis not present

## 2021-08-23 LAB — LIPID PANEL
Cholesterol: 121 mg/dL (ref 0–200)
HDL: 36.9 mg/dL — ABNORMAL LOW (ref >39.00)
LDL Cholesterol: 64 mg/dL (ref 0–99)
NonHDL: 84.25
Total CHOL/HDL Ratio: 3
Triglycerides: 99 mg/dL (ref 0.0–149.0)
VLDL: 19.8 mg/dL (ref 0.0–40.0)

## 2021-08-23 LAB — COMPREHENSIVE METABOLIC PANEL
ALT: 21 U/L (ref 0–53)
AST: 21 U/L (ref 0–37)
Albumin: 4.5 g/dL (ref 3.5–5.2)
Alkaline Phosphatase: 70 U/L (ref 39–117)
BUN: 17 mg/dL (ref 6–23)
CO2: 26 mEq/L (ref 19–32)
Calcium: 9.9 mg/dL (ref 8.4–10.5)
Chloride: 101 mEq/L (ref 96–112)
Creatinine, Ser: 0.96 mg/dL (ref 0.40–1.50)
GFR: 89.57 mL/min (ref >60.00)
Glucose, Bld: 80 mg/dL (ref 70–99)
Potassium: 4.1 mEq/L (ref 3.5–5.1)
Sodium: 138 mEq/L (ref 135–145)
Total Bilirubin: 1.2 mg/dL (ref 0.2–1.2)
Total Protein: 7.5 g/dL (ref 6.0–8.3)

## 2021-08-23 LAB — CBC
HCT: 43.6 % (ref 39.0–52.0)
Hemoglobin: 15.1 g/dL (ref 13.0–17.0)
MCHC: 34.6 g/dL (ref 30.0–36.0)
MCV: 89.4 fl (ref 78.0–100.0)
Platelets: 238 10*3/uL (ref 150.0–400.0)
RBC: 4.88 Mil/uL (ref 4.22–5.81)
RDW: 13.6 % (ref 11.5–15.5)
WBC: 5.6 10*3/uL (ref 4.0–10.5)

## 2021-08-23 LAB — TSH: TSH: 0.45 u[IU]/mL (ref 0.35–5.50)

## 2021-08-23 NOTE — Progress Notes (Signed)
Annual Exam   Chief Complaint:  Chief Complaint  Patient presents with   Annual Exam    No concerns  Declined flu shot     History of Present Illness:  Paul Shannon is a 55 y.o. presents today for annual examination.    #HTN - 115/83 at home - well controlled - occasionally higher with exercise  Nutrition/Lifestyle Diet: limits meat, eats chicken, kale and blueberry, oatmeal and eggs Exercise: pelaton bike daily  He is not currently sexual active Any issues with getting or keeping erection? No  Social History   Tobacco Use  Smoking Status Never  Smokeless Tobacco Never   Social History   Substance and Sexual Activity  Alcohol Use Yes   Comment: glass of wine 1 time per month   Social History   Substance and Sexual Activity  Drug Use No     Safety The patient wears seatbelts: yes.     The patient feels safe at home and in their relationships: yes.  General Health Dentist in the last year: Yes Eye doctor: yes  Weight Wt Readings from Last 3 Encounters:  08/23/21 210 lb (95.3 kg)  02/25/21 202 lb (91.6 kg)  01/26/21 201 lb (91.2 kg)   Patient has high BMI  BMI Readings from Last 1 Encounters:  08/23/21 31.93 kg/m     Chronic disease screening Blood pressure monitoring:  BP Readings from Last 3 Encounters:  08/23/21 110/88  02/25/21 (!) 142/78  01/26/21 (!) 136/94    Lipid Monitoring: Indication for screening: age >35, obesity, diabetes, family hx, CV risk factors.  Lipid screening: Yes  Lab Results  Component Value Date   CHOL 89 08/03/2020   HDL 35.90 (L) 08/03/2020   LDLCALC 42 08/03/2020   TRIG 53.0 08/03/2020   CHOLHDL 2 08/03/2020     Diabetes Screening: age >60, overweight, family hx, PCOS, hx of gestational diabetes, at risk ethnicity, elevated blood pressure >135/80.  Diabetes Screening screening: Yes  Lab Results  Component Value Date   HGBA1C 5.1 08/03/2020     Prostate Cancer Screening: Yes Age 67-69 yo Shared  Decision Making Higher Risk: Older age, African American, Family Hx of Prostate Cancer - No Benefits: screening may prevent 1.3 deaths from prostate cancer over 13 years per 1000 men screened and prevent 3 metastatic cases per 1000 men screened. Not enough evidence to support more benefit for AA or [place] Harms: False Positive and psychological harms. 15% of me with false positive over a 2 to 4 year period > resulting in biopsy and complications such as pain, hematospermia, infections. Overdiagnosis - increases with age - found that 20-50% of prostate cancer through screening may have never caused any issues. Harms of treatment include - erectile dysfunction, urinary incontinence, and bothersome bowel symptoms.   After discussion he does want to get a PSA checked today.   Inadequate evidence for screening <55 No mortality benefit for screening >70   Lab Results  Component Value Date   PSA 1.15 04/23/2018   PSA 1.41 04/06/2017   PSA 0.94 04/14/2016       Colon Cancer Screening:  Age 42-75 yo - benefits outweigh the risk. Adults 88-85 yo who have never been screened benefit.  Benefits: 134000 people in 2016 will be diagnosed and 49,000 will die - early detection helps Harms: Complications 2/2 to colonoscopy High Risk (Colonoscopy): genetic disorder (Lynch syndrome or familial adenomatous polyposis), personal hx of IBD, previous adenomatous polyp, or previous colorectal cancer, FamHx start 10  years before the age at diagnosis, increased in males and black race  Options:  FIT - looks for hemoglobin (blood in the stool) - specific and fairly sensitive - must be done annually Cologuard - looks for DNA and blood - more sensitive - therefore can have more false positives, every 3 years Colonoscopy - every 10 years if normal - sedation, bowl prep, must have someone drive you  Shared decision making and the patient had decided to do colonoscopy.   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, port currently drained   Esophageal reflux    Fatty liver    non problems since lapband surgery   History of chickenpox    History of kidney stones    Hypertension    Irritable bowel syndrome    Nausea & vomiting    at night due to regirgitation, hob elevated sleeps on pillows or recliner   Overweight(278.02)    Unspecified hemorrhoids without mention of complication    Unspecified hypothyroidism    Vomiting (bilious) following gastrointestinal surgery 08/10/2018    Past Surgical History:  Procedure Laterality Date   BIOPSY  08/02/2018   Procedure: BIOPSY;  Surgeon: Alphonsa Overall, MD;  Location: [facility];  Service: General;;   BREATH TEK H PYLORI  10/02/2012   Procedure: Lauris Chroman;  Surgeon: Shann Medal, MD;  Location: [facility];  Service: General;  Laterality: N/A;   BUNIONECTOMY  2020   CHOLECYSTECTOMY  05/11/2012   COLONOSCOPY  2014   ESOPHAGOGASTRODUODENOSCOPY N/A 08/02/2018   Procedure: UPPER ENDOSCOPY;  Surgeon: Alphonsa Overall, MD;  Location: [facility];  Service: General;  Laterality: N/A;   KNEE SURGERY  2011   right knee  arthroscopic 2 surgeries   LAPAROSCOPIC GASTRIC BANDING  12/25/2012   Procedure: LAPAROSCOPIC GASTRIC BANDING;  Surgeon: Shann Medal, MD;  Location: [facility];  Service: General;  Laterality: N/A;  Lap Band Placement   LIVER BIOPSY     LIVER BIOPSY  12/25/2012   Procedure: LIVER BIOPSY;  Surgeon: Shann Medal, MD;  Location: [facility];  Service: General;;   MESH APPLIED TO LAP PORT  12/25/2012   Procedure: MESH APPLIED TO LAP PORT;   Surgeon: Shann Medal, MD;  Location: [facility];  Service: General;;   TONSILLECTOMY AND ADENOIDECTOMY  as child    Prior to Admission medications   Medication Sig Start Date End Date Taking? Authorizing Provider  amLODipine (NORVASC) 5 MG tablet Take 1 tablet (5 mg total) by mouth daily. 01/26/21  Yes Lesleigh Noe, MD  fluticasone (FLONASE) 50 MCG/ACT nasal spray Place into both nostrils daily.   Yes [provider]  levothyroxine (SYNTHROID) 175 MCG tablet TAKE 1 TABLET(175 MCG) BY MOUTH DAILY BEFORE AND BREAKFAST 05/17/21  Yes Lesleigh Noe, MD  losartan (COZAAR) 100 MG tablet Take 1 tablet (100 mg total) by mouth daily. 02/25/21  Yes Lesleigh Noe, MD  MELATONIN PO Take by mouth as needed. 12 mg  1 daily   Yes [provider]  sildenafil (REVATIO) 20 MG tablet SMARTSIG:3 Tablet(s) By Mouth PRN 10/26/20  Yes [provider]  traZODone (DESYREL) 50 MG tablet Take 0.5-1 tablets (25-50 mg total) by mouth at bedtime as needed for sleep. 01/26/21  Yes Lesleigh Noe, MD    Allergies  Allergen Reactions   Niacin     REACTION: pt states INTOL w/ flushing   Other     Red dye=rash with a tattoo    Penicillins     REACTION: childhood - extreme rash   Red Dye    Rosuvastatin     REACTION: pt states INTOL     Social History   Socioeconomic History   Marital status: Legally Separated    Spouse name: Not on file   Number of children: 2    Years of education: management trainings   Highest education level: Not on file  Occupational History   Occupation: Arts administrator: [employer]  Tobacco Use   Smoking status: Never   Smokeless tobacco: Never  Vaping Use   Vaping Use: Never used  Substance and Sexual Activity   Alcohol use: Yes    Comment: glass of wine 1 time per month   Drug use: No   Sexual activity: Yes    Birth control/protection: I.U.D.  Other Topics Concern   Not on file  Social History Narrative   08/03/20   From: [place], moved to [place] 27 years ago   Living: with his son (getting divorced from wife   Work: Advice worker      Family: one daughter Mel Almond with grandson  and Reva Bores (2004) who lives with him      Enjoys: spend time outside, hunting, yard work      Exercise: not since foot injury, was doing boot camps    Diet: not great, planning to make a change today, will stay away from sugary beverages      Safety   Seat belts: Yes    Guns: Yes    Safe in relationships: Yes    Social Determinants of Radio broadcast assistant Strain: Not on file  Food Insecurity: Not on file  Transportation Needs: Not on file  Physical Activity: Not on file  Stress: Not on file  Social Connections: Not on file  Intimate Partner Violence: Not on file    Family History  Problem Relation Age of Onset   Kidney cancer Father    Hyperlipidemia Father    Cirrhosis Father        fatty liver   Diabetes Mother    Arthritis Mother    Hyperlipidemia Mother    Heart disease Maternal Grandfather    Diabetes Maternal Grandfather    Thyroid disease Sister    Arthritis Sister    COPD Sister    Cirrhosis Sister        due to fatty liver   Kidney disease Maternal Grandmother    Early death Paternal Grandfather        killed   Other Brother        fatty liver   Hyperlipidemia Brother    Other Brother        fatty liver   Colon cancer Neg Hx    Esophageal cancer Neg Hx    Stomach cancer Neg Hx    Rectal cancer Neg Hx     Review of Systems  Constitutional:  Negative for chills and fever.  HENT:  Negative for congestion and sore throat.   Eyes:  Negative for blurred vision and double vision.  Respiratory:  Negative for shortness of breath.   Cardiovascular:  Negative for chest pain.  Gastrointestinal:  Negative for heartburn, nausea and vomiting.  Genitourinary: Negative.   Musculoskeletal: Negative.  Negative for myalgias.  Skin:  Negative for rash.  Neurological:  Negative for dizziness and headaches.   Endo/Heme/Allergies:  Does not bruise/bleed easily.  Psychiatric/Behavioral:  Negative for depression. The patient is not nervous/anxious.     Physical Exam BP 110/88   Pulse 81   Temp (!) 97 F (36.1 C) (Temporal)   Ht '5\' 8"'$  (1.727 m)   Wt 210 lb (95.3 kg)   SpO2 96%   BMI 31.93 kg/m    BP Readings from Last 3 Encounters:  08/23/21 110/88  02/25/21 (!) 142/78  01/26/21 (!) 136/94      Physical Exam Constitutional:      General: He is not in acute distress.    Appearance: He is well-developed. He is not diaphoretic.  HENT:     Head: Normocephalic and atraumatic.     Right Ear: Tympanic membrane and ear canal normal.     Left Ear: Tympanic membrane and ear canal normal.     Nose: Nose normal.     Mouth/Throat:     Pharynx: Uvula midline.  Eyes:     General: No scleral icterus.    Conjunctiva/sclera: Conjunctivae normal.     Pupils: Pupils are equal, round, and reactive to light.  Cardiovascular:     Rate and Rhythm: Normal rate and regular rhythm.     Heart sounds: Normal heart sounds. No murmur heard. Pulmonary:     Effort: Pulmonary effort is normal. No respiratory distress.     Breath sounds: Normal breath sounds. No wheezing.  Abdominal:     General: Bowel sounds are normal. There is no distension.     Palpations: Abdomen is soft. There is no mass.     Tenderness: There is no abdominal tenderness. There is no guarding.  Musculoskeletal:        General: Normal range of motion.     Cervical back: Normal range of motion and neck supple.  Lymphadenopathy:     Cervical: No cervical adenopathy.  Skin:    General: Skin is warm and dry.     Capillary Refill: Capillary refill takes less than 2 seconds.  Neurological:     Mental Status: He is alert and oriented to person, place, and time.       Results:  PHQ-9:  [place] Office Visit from 01/26/2021 in [place] at [place]  PHQ-9 Total Score 7         Assessment: 55 y.o. here for  routine annual physical examination.  Plan: Problem List Items Addressed This Visit       Cardiovascular and Mediastinum   Essential hypertension   Relevant Orders   Comprehensive metabolic panel   CBC     Digestive   Hepatic steatosis   Relevant Orders   Comprehensive metabolic panel   CBC   Lipid panel     Endocrine   Hypothyroidism   Relevant Orders   TSH     Other   HYPERCHOLESTEROLEMIA   Relevant Orders   Lipid panel   Other Visit Diagnoses     Annual physical exam    -  Primary   Encounter for screening colonoscopy       Relevant  Orders   Ambulatory referral to Gastroenterology   Need for Tdap vaccination       Relevant Orders   Tdap vaccine greater than or equal to 7yo IM (Completed)       Screening: -- Blood pressure screen  controlled -- cholesterol screening: will obtain -- Weight screening: overweight: continue to monitor -- Diabetes Screening: will obtain -- Nutrition: normal - Encouraged healthy diet  The ASCVD Risk score (Arnett DK, et al., 2019) failed to calculate for the following reasons:   The valid total cholesterol range is 130 to 320 mg/dL  -- ASA 81 mg discussed if CVD risk >10% age 56-59 and willing to take for 10 years -- Statin therapy for Age 39-75 with CVD risk >7.5%  Psych -- Depression screening (PHQ-9): negative  Safety -- tobacco screening: not using -- alcohol screening:  low-risk usage. -- no evidence of domestic violence or intimate partner violence.  Cancer Screening -- Prostate (age 50-69) ordered -- Colon (age 52-75) ordered -- Lung not indicated   Immunizations Immunization History  Administered Date(s) Administered   Hep A / Hep B 06/26/2012, 07/03/2012, 07/27/2012, 07/31/2013   Moderna SARS-COV2 Booster Vaccination 12/01/2020   Moderna Sars-Covid-2 Vaccination 03/12/2020, 04/09/2020   Td 03/19/2010   Tdap 08/23/2021    -- flu vaccine not up to date - declined -- TDAP q10 years up to date --  Shingles (age >50) not up to date - pt declined, encouraged getting -- Covid-19 Vaccine up to date  Encouraged regular vision and dental screening. Encouraged healthy exercise and diet.   Lesleigh Noe

## 2021-08-23 NOTE — Patient Instructions (Signed)
Preventive Care 40-54 Years Old, Male Preventive care refers to lifestyle choices and visits with your health care provider that can promote health and wellness. This includes: A yearly physical exam. This is also called an annual wellness visit. Regular dental and eye exams. Immunizations. Screening for certain conditions. Healthy lifestyle choices, such as: Eating a healthy diet. Getting regular exercise. Not using drugs or products that contain nicotine and tobacco. Limiting alcohol use. What can I expect for my preventive care visit? Physical exam Your health care provider will check your: Height and weight. These may be used to calculate your BMI (body mass index). BMI is a measurement that tells if you are at a healthy weight. Heart rate and blood pressure. Body temperature. Skin for abnormal spots. Counseling Your health care provider may ask you questions about your: Past medical problems. Family's medical history. Alcohol, tobacco, and drug use. Emotional well-being. Home life and relationship well-being. Sexual activity. Diet, exercise, and sleep habits. Work and work environment. Access to firearms. What immunizations do I need? Vaccines are usually given at various ages, according to a schedule. Your health care provider will recommend vaccines for you based on your age, medical history, and lifestyle or other factors, such as travel or where you work. What tests do I need? Blood tests Lipid and cholesterol levels. These may be checked every 5 years, or more often if you are over 50 years old. Hepatitis C test. Hepatitis B test. Screening Lung cancer screening. You may have this screening every year starting at age 55 if you have a 30-pack-year history of smoking and currently smoke or have quit within the past 15 years. Prostate cancer screening. Recommendations will vary depending on your family history and other risks. Genital exam to check for testicular cancer  or hernias. Colorectal cancer screening. All adults should have this screening starting at age 50 and continuing until age 75. Your health care provider may recommend screening at age 45 if you are at increased risk. You will have tests every 1-10 years, depending on your results and the type of screening test. Diabetes screening. This is done by checking your blood sugar (glucose) after you have not eaten for a while (fasting). You may have this done every 1-3 years. STD (sexually transmitted disease) testing, if you are at risk. Follow these instructions at home: Eating and drinking  Eat a diet that includes fresh fruits and vegetables, whole grains, lean protein, and low-fat dairy products. Take vitamin and mineral supplements as recommended by your health care provider. Do not drink alcohol if your health care provider tells you not to drink. If you drink alcohol: Limit how much you have to 0-2 drinks a day. Be aware of how much alcohol is in your drink. In the [place] one drink equals one 12 oz bottle of beer (355 mL), one 5 oz glass of wine (148 mL), or one 1 oz glass of hard liquor (44 mL). Lifestyle Take daily care of your teeth and gums. Brush your teeth every morning and night with fluoride toothpaste. Floss one time each day. Stay active. Exercise for at least 30 minutes 5 or more days each week. Do not use any products that contain nicotine or tobacco, such as cigarettes, e-cigarettes, and chewing tobacco. If you need help quitting, ask your health care provider. Do not use drugs. If you are sexually active, practice safe sex. Use a condom or other form of protection to prevent STIs (sexually transmitted infections). If told by your   health care provider, take low-dose aspirin daily starting at age 50. Find healthy ways to cope with stress, such as: Meditation, yoga, or listening to music. Journaling. Talking to a trusted person. Spending time with friends and  family. Safety Always wear your seat belt while driving or riding in a vehicle. Do not drive: If you have been drinking alcohol. Do not ride with someone who has been drinking. When you are tired or distracted. While texting. Wear a helmet and other protective equipment during sports activities. If you have firearms in your house, make sure you follow all gun safety procedures. What's next? Go to your health care provider once a year for an annual wellness visit. Ask your health care provider how often you should have your eyes and teeth checked. Stay up to date on all vaccines. This information is not intended to replace advice given to you by your health care provider. Make sure you discuss any questions you have with your health care provider. Document Revised: 02/05/2021 Document Reviewed: 11/22/2018 Elsevier Patient Education  2022 Elsevier Inc.   

## 2021-08-24 ENCOUNTER — Other Ambulatory Visit: Payer: Self-pay

## 2021-08-24 DIAGNOSIS — Z1211 Encounter for screening for malignant neoplasm of colon: Secondary | ICD-10-CM

## 2021-08-24 MED ORDER — CLENPIQ 10-3.5-12 MG-GM -GM/160ML PO SOLN: 1.0000 | Freq: Once | ORAL | 0 refills | Status: AC

## 2021-08-24 NOTE — Progress Notes (Addendum)
Gastroenterology Pre-Procedure Review  Request Date: 09/28/21 Requesting Physician: Dr. Allen Norris  PATIENT REVIEW QUESTIONS: The patient responded to the following health history questions as indicated:    1. Are you having any GI issues? no 2. Do you have a personal history of Polyps? no 3. Do you have a family history of Colon Cancer or Polyps? no 4. Diabetes Mellitus? no 5. Joint replacements in the past 12 months?no 6. Major health problems in the past 3 months?no 7. Any artificial heart valves, MVP, or defibrillator?no    MEDICATIONS & ALLERGIES:    Patient reports the following regarding taking any anticoagulation/antiplatelet therapy:   Plavix, Coumadin, Eliquis, Xarelto, Lovenox, Pradaxa, Brilinta, or Effient? no Aspirin? no  Patient confirms/reports the following medications:  Current Outpatient Medications  Medication Sig Dispense Refill   amLODipine (NORVASC) 5 MG tablet Take 1 tablet (5 mg total) by mouth daily. 90 tablet 3   fluticasone (FLONASE) 50 MCG/ACT nasal spray Place into both nostrils daily.     levothyroxine (SYNTHROID) 175 MCG tablet TAKE 1 TABLET(175 MCG) BY MOUTH DAILY BEFORE AND BREAKFAST 90 tablet 1   losartan (COZAAR) 100 MG tablet Take 1 tablet (100 mg total) by mouth daily. 90 tablet 3   sildenafil (REVATIO) 20 MG tablet SMARTSIG:3 Tablet(s) By Mouth PRN     No current facility-administered medications for this visit.    Patient confirms/reports the following allergies:  Allergies  Allergen Reactions   Niacin     REACTION: pt states INTOL w/ flushing   Other     Red dye=rash with a tattoo    Penicillins     REACTION: childhood - extreme rash   Red Dye    Rosuvastatin     REACTION: pt states INTOL    No orders of the defined types were placed in this encounter.   AUTHORIZATION INFORMATION Primary Insurance: 1D#: Group #:  Secondary Insurance: 1D#: Group #:  SCHEDULE INFORMATION: Date: 09/28/21 Time: Location: [place]

## 2021-08-30 ENCOUNTER — Encounter: Payer: Self-pay | Admitting: Family Medicine

## 2021-09-08 ENCOUNTER — Encounter: Payer: Self-pay | Admitting: Gastroenterology

## 2021-09-27 ENCOUNTER — Encounter: Payer: Self-pay | Admitting: Gastroenterology

## 2021-09-28 ENCOUNTER — Ambulatory Visit: Payer: Commercial Managed Care - PPO | Admitting: Anesthesiology

## 2021-09-28 ENCOUNTER — Encounter
Admission: RE | Disposition: A | Discharge status: Home / Self Care | Payer: Self-pay | Source: Home / Self Care | Attending: Gastroenterology

## 2021-09-28 ENCOUNTER — Encounter: Payer: Self-pay | Admitting: Gastroenterology

## 2021-09-28 ENCOUNTER — Ambulatory Visit
Admission: RE | Admit: 2021-09-28 | Discharge: 2021-09-28 | Disposition: A | Discharge status: Home / Self Care | Payer: Commercial Managed Care - PPO | Attending: Gastroenterology | Admitting: Gastroenterology

## 2021-09-28 ENCOUNTER — Other Ambulatory Visit: Payer: Self-pay

## 2021-09-28 DIAGNOSIS — K635 Polyp of colon: Secondary | ICD-10-CM

## 2021-09-28 DIAGNOSIS — Z8051 Family history of malignant neoplasm of kidney: Secondary | ICD-10-CM | POA: Insufficient documentation

## 2021-09-28 DIAGNOSIS — Z1211 Encounter for screening for malignant neoplasm of colon: Secondary | ICD-10-CM | POA: Diagnosis present

## 2021-09-28 DIAGNOSIS — K5730 Diverticulosis of large intestine without perforation or abscess without bleeding: Secondary | ICD-10-CM | POA: Insufficient documentation

## 2021-09-28 DIAGNOSIS — Z808 Family history of malignant neoplasm of other organs or systems: Secondary | ICD-10-CM | POA: Insufficient documentation

## 2021-09-28 DIAGNOSIS — K640 First degree hemorrhoids: Secondary | ICD-10-CM | POA: Insufficient documentation

## 2021-09-28 HISTORY — PX: COLONOSCOPY WITH PROPOFOL: SHX5780

## 2021-09-28 SURGERY — COLONOSCOPY WITH PROPOFOL
Anesthesia: General

## 2021-09-28 MED ORDER — SODIUM CHLORIDE 0.9 % IV SOLN: INTRAVENOUS | Status: DC

## 2021-09-28 MED ORDER — PROPOFOL 10 MG/ML IV BOLUS
INTRAVENOUS | Status: DC | PRN
  Administered 2021-09-28: 80 mg via INTRAVENOUS
  Administered 2021-09-28: 20 mg via INTRAVENOUS

## 2021-09-28 MED ORDER — PROPOFOL 500 MG/50ML IV EMUL
INTRAVENOUS | Status: DC | PRN
  Administered 2021-09-28: 140 ug/kg/min via INTRAVENOUS

## 2021-09-28 NOTE — Anesthesia Postprocedure Evaluation (Signed)
Anesthesia Post Note  Patient: Tessie Eke  Procedure(s) Performed: COLONOSCOPY WITH PROPOFOL  Patient location during evaluation: PACU Anesthesia Type: General Level of consciousness: awake and alert, oriented and patient cooperative Pain management: pain level controlled Vital Signs Assessment: post-procedure vital signs reviewed and stable Respiratory status: spontaneous breathing, nonlabored ventilation and respiratory function stable Cardiovascular status: blood pressure returned to baseline and stable Postop Assessment: adequate PO intake Anesthetic complications: no   No notable events documented.   Last Vitals:  Vitals:   09/28/21 0927 09/28/21 0929  BP:  130/82  Pulse:    Resp:    Temp: (!) 35.9 C   SpO2:      Last Pain:  Vitals:   09/28/21 0927  TempSrc: Temporal  PainSc: 0-No pain                 Darrin Nipper

## 2021-09-28 NOTE — Op Note (Signed)
[facility] Gastroenterology Patient Name: Paul Shannon Procedure Date: 09/28/2021 8:40 AM MRN: [id number] Account #: [id number] Date of Birth: 1966-07-01 Admit Type: Outpatient Age: 55 Room: [facility] ENDO ROOM 4 Gender: Male Note Status: Finalized Instrument Name: Jasper Riling [id number] Procedure:             Colonoscopy Indications:           Screening for colorectal malignant neoplasm Providers:             Lucilla Lame MD, MD Referring MD:          Jobe Marker. Einar Pheasant (Referring MD) Medicines:             Propofol per Anesthesia Complications:         No immediate complications. Procedure:             Pre-Anesthesia Assessment:                        - Prior to the procedure, a History and Physical was                         performed, and patient medications and allergies were                         reviewed. The patient's tolerance of previous                         anesthesia was also reviewed. The risks and benefits                         of the procedure and the sedation options and risks                         were discussed with the patient. All questions were                         answered, and informed consent was obtained. Prior                         Anticoagulants: The patient has taken no previous                         anticoagulant or antiplatelet agents. ASA Grade                         Assessment: II - A patient with mild systemic disease.                         After reviewing the risks and benefits, the patient                         was deemed in satisfactory condition to undergo the                         procedure.                        After obtaining informed consent, the colonoscope was  passed under direct vision. Throughout the procedure,                         the patient's blood pressure, pulse, and oxygen                         saturations were monitored continuously. The                          Colonoscope was introduced through the anus and                         advanced to the the cecum, identified by appendiceal                         orifice and ileocecal valve. The colonoscopy was                         performed without difficulty. The patient tolerated                         the procedure well. The quality of the bowel                         preparation was excellent. Findings:      The perianal and digital rectal examinations were normal.      A 4 mm polyp was found in the cecum. The polyp was sessile. The polyp       was removed with a cold snare. Resection and retrieval were complete.      Multiple small-mouthed diverticula were found in the sigmoid colon.      Non-bleeding internal hemorrhoids were found during retroflexion. The       hemorrhoids were Grade I (internal hemorrhoids that do not prolapse). Impression:            - One 4 mm polyp in the cecum, removed with a cold                         snare. Resected and retrieved.                        - Diverticulosis in the sigmoid colon.                        - Non-bleeding internal hemorrhoids. Recommendation:        - Discharge patient to home.                        - Resume previous diet.                        - Continue present medications.                        - Await pathology results.                        - If the pathology report reveals adenomatous tissue,  then repeat the colonoscopy for surveillance in 7                         years otherwise 10 years. Procedure Code(s):     --- Professional ---                        [phone], Colonoscopy, flexible; with removal of                         tumor(s), polyp(s), or other lesion(s) by snare                         technique Diagnosis Code(s):     --- Professional ---                        Z12.11, Encounter for screening for malignant neoplasm                         of colon                        K63.5, Polyp of  colon CPT copyright 2019 American Medical Association. All rights reserved. The codes documented in this report are preliminary and upon coder review may  be revised to meet current compliance requirements. Lucilla Lame MD, MD 09/28/2021 8:58:29 AM This report has been signed electronically. Number of Addenda: 0 Note Initiated On: 09/28/2021 8:40 AM Scope Withdrawal Time: 0 hours 9 minutes 21 seconds  Total Procedure Duration: 0 hours 11 minutes 48 seconds  Estimated Blood Loss:  Estimated blood loss: none.      [facility]

## 2021-09-28 NOTE — Anesthesia Preprocedure Evaluation (Signed)
Anesthesia Evaluation  Patient identified by MRN, date of birth, ID band Patient awake    Reviewed: Allergy & Precautions, NPO status , Patient's Chart, lab work & pertinent test results  History of Anesthesia Complications (+) PONV and history of anesthetic complications  Airway Mallampati: III   Neck ROM: Full    Dental no notable dental hx.    Pulmonary neg pulmonary ROS,    Pulmonary exam normal breath sounds clear to auscultation       Cardiovascular hypertension, Normal cardiovascular exam Rhythm:Regular Rate:Normal     Neuro/Psych PSYCHIATRIC DISORDERS Anxiety negative neurological ROS     GI/Hepatic GERD  ,Fatty liver Lap gastric band s/p removal   Endo/Other  Hypothyroidism   Renal/GU Renal disease (nephrolithiasis)     Musculoskeletal  (+) Arthritis ,   Abdominal   Peds  Hematology negative hematology ROS (+)   Anesthesia Other Findings   Reproductive/Obstetrics                             Anesthesia Physical Anesthesia Plan  ASA: 2  Anesthesia Plan: General   Post-op Pain Management:    Induction: Intravenous  PONV Risk Score and Plan: 3 and Propofol infusion, TIVA and Treatment may vary due to age or medical condition  Airway Management Planned: Natural Airway  Additional Equipment:   Intra-op Plan:   Post-operative Plan:   Informed Consent: I have reviewed the patients History and Physical, chart, labs and discussed the procedure including the risks, benefits and alternatives for the proposed anesthesia with the patient or authorized representative who has indicated his/her understanding and acceptance.       Plan Discussed with: CRNA  Anesthesia Plan Comments:         Anesthesia Quick Evaluation

## 2021-09-28 NOTE — Brief Op Note (Signed)
Simethicone used for syringe wash. Scope washer informed

## 2021-09-28 NOTE — H&P (Signed)
Lucilla Lame, MD [facility] [address], [place], [place] [address] Phone: [phone] Fax : [phone]  Primary Care Physician:  Lesleigh Noe, MD Primary Gastroenterologist:  Dr. Allen Norris  Pre-Procedure History & Physical: HPI:  Paul Shannon is a 55 y.o. male is here for a screening colonoscopy.   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, port currently drained   Esophageal reflux    Fatty liver    non problems since lapband surgery   History of chickenpox    History of kidney stones    Hypertension    Irritable bowel syndrome    Nausea & vomiting    at night due to regirgitation, hob elevated sleeps on pillows or recliner   Overweight(278.02)    Unspecified hemorrhoids without mention of complication    Unspecified hypothyroidism    Vomiting (bilious) following gastrointestinal surgery 08/10/2018    Past Surgical History:  Procedure Laterality Date   BIOPSY  08/02/2018   Procedure: BIOPSY;  Surgeon: Alphonsa Overall, MD;  Location: [facility];  Service: General;;   BREATH TEK H PYLORI  10/02/2012   Procedure: Lauris Chroman;  Surgeon: Shann Medal, MD;  Location: [facility];  Service: General;  Laterality: N/A;   BUNIONECTOMY  2020   CHOLECYSTECTOMY  05/11/2012   COLONOSCOPY  2014   DIAGNOSTIC LAPAROSCOPY     ESOPHAGOGASTRODUODENOSCOPY N/A 08/02/2018   Procedure: UPPER ENDOSCOPY;  Surgeon: Alphonsa Overall, MD;  Location: [facility];  Service: General;  Laterality: N/A;   KNEE SURGERY  2011   right knee  arthroscopic 2 surgeries   LAPAROSCOPIC GASTRIC BANDING  12/25/2012   Procedure: LAPAROSCOPIC GASTRIC BANDING;  Surgeon: Shann Medal, MD;  Location: [facility];  Service: General;   Laterality: N/A;  Lap Band Placement   LIVER BIOPSY     LIVER BIOPSY  12/25/2012   Procedure: LIVER BIOPSY;  Surgeon: Shann Medal, MD;  Location: [facility];  Service: General;;   MESH APPLIED TO LAP PORT  12/25/2012   Procedure: MESH APPLIED TO LAP PORT;  Surgeon: Shann Medal, MD;  Location: [facility];  Service: General;;   TONSILLECTOMY AND ADENOIDECTOMY  as child    Prior to Admission medications   Medication Sig Start Date End Date Taking? Authorizing Provider  amLODipine (NORVASC) 5 MG tablet Take 1 tablet (5 mg total) by mouth daily. 01/26/21  Yes Lesleigh Noe, MD  levothyroxine (SYNTHROID) 175 MCG tablet TAKE 1 TABLET(175 MCG) BY MOUTH DAILY BEFORE AND BREAKFAST 05/17/21  Yes Lesleigh Noe, MD  losartan (COZAAR) 100 MG tablet Take 1 tablet (100 mg total) by mouth daily. 02/25/21  Yes Lesleigh Noe, MD  fluticasone (FLONASE) 50 MCG/ACT nasal spray Place into both nostrils daily.    [provider]  sildenafil (REVATIO) 20 MG tablet SMARTSIG:3 Tablet(s) By Mouth PRN 10/26/20   [provider]    Allergies as of 08/25/2021 - Review Complete 02/25/2021  Allergen Reaction Noted   Niacin     Other  05/01/2012   Penicillins     Red dye  04/28/2015   Rosuvastatin      Family  History  Problem Relation Age of Onset   Kidney cancer Father    Hyperlipidemia Father    Cirrhosis Father        fatty liver   Diabetes Mother    Arthritis Mother    Hyperlipidemia Mother    Heart disease Maternal Grandfather    Diabetes Maternal Grandfather    Thyroid disease Sister    Arthritis Sister    COPD Sister    Cirrhosis Sister        due to fatty liver   Kidney disease Maternal Grandmother    Early death Paternal Grandfather        killed   Other Brother        fatty liver   Hyperlipidemia Brother    Other Brother        fatty liver   Colon cancer Neg Hx    Esophageal cancer Neg Hx    Stomach cancer Neg Hx    Rectal cancer Neg Hx     Social History    Socioeconomic History   Marital status: Legally Separated    Spouse name: Not on file   Number of children: 2    Years of education: management trainings   Highest education level: Not on file  Occupational History   Occupation: Arts administrator: [employer]  Tobacco Use   Smoking status: Never   Smokeless tobacco: Never  Vaping Use   Vaping Use: Never used  Substance and Sexual Activity   Alcohol use: Yes    Comment: glass of wine 1 time per month.non last 24 hrs   Drug use: No   Sexual activity: Yes    Birth control/protection: I.U.D.  Other Topics Concern   Not on file  Social History Narrative   08/03/20   From: [place], moved to [place] 27 years ago   Living: with his son (getting divorced from wife   Work: Advice worker      Family: one daughter Mel Almond with grandson  and Reva Bores (2004) who lives with him      Enjoys: spend time outside, hunting, yard work      Exercise: not since foot injury, was doing boot camps    Diet: not great, planning to make a change today, will stay away from sugary beverages      Safety   Seat belts: Yes    Guns: Yes    Safe in relationships: Yes    Social Determinants of Radio broadcast assistant Strain: Not on file  Food Insecurity: Not on file  Transportation Needs: Not on file  Physical Activity: Not on file  Stress: Not on file  Social Connections: Not on file  Intimate Partner Violence: Not on file    Review of Systems: See HPI, otherwise negative ROS  Physical Exam: BP 140/90   Pulse 70   Temp (!) 97.5 F (36.4 C) (Temporal)   Resp 20   Ht 5\' 10"  (1.778 m)   Wt 93 kg   SpO2 96%   BMI 29.41 kg/m  General:   Alert,  pleasant and cooperative in NAD Head:  Normocephalic and atraumatic. Neck:  Supple; no masses or thyromegaly. Lungs:  Clear throughout to auscultation.    Heart:  Regular rate and rhythm. Abdomen:  Soft, nontender and nondistended. Normal bowel sounds, without guarding, and without rebound.    Neurologic:  Alert and  oriented x4;  grossly normal neurologically.  Impression/Plan: Paul Shannon is now here to undergo  a screening colonoscopy.  Risks, benefits, and alternatives regarding colonoscopy have been reviewed with the patient.  Questions have been answered.  All parties agreeable.

## 2021-09-28 NOTE — Transfer of Care (Signed)
Immediate Anesthesia Transfer of Care Note  Patient: Paul Shannon  Procedure(s) Performed: COLONOSCOPY WITH PROPOFOL  Patient Location: PACU  Anesthesia Type:General  Level of Consciousness: sedated  Airway & Oxygen Therapy: Patient Spontanous Breathing  Post-op Assessment: Report given to RN and Post -op Vital signs reviewed and stable  Post vital signs: Reviewed and stable  Last Vitals:  Vitals Value Taken Time  BP 100/59 09/28/21 0900  Temp    Pulse 64 09/28/21 0900  Resp 13 09/28/21 0900  SpO2 94 % 09/28/21 0900  Vitals shown include unvalidated device data.  Last Pain:  Vitals:   09/28/21 0859  TempSrc:   PainSc: 0-No pain         Complications: No notable events documented.

## 2021-09-29 ENCOUNTER — Encounter: Payer: Self-pay | Admitting: Gastroenterology

## 2021-09-29 LAB — SURGICAL PATHOLOGY

## 2021-09-30 ENCOUNTER — Encounter: Payer: Self-pay | Admitting: Gastroenterology

## 2021-11-22 ENCOUNTER — Other Ambulatory Visit: Payer: Self-pay | Admitting: Family Medicine

## 2021-11-22 DIAGNOSIS — E039 Hypothyroidism, unspecified: Secondary | ICD-10-CM

## 2022-02-01 ENCOUNTER — Other Ambulatory Visit: Payer: Self-pay | Admitting: Family Medicine

## 2022-02-01 DIAGNOSIS — I10 Essential (primary) hypertension: Secondary | ICD-10-CM

## 2022-03-06 ENCOUNTER — Other Ambulatory Visit: Payer: Self-pay | Admitting: Family Medicine

## 2022-03-06 DIAGNOSIS — I10 Essential (primary) hypertension: Secondary | ICD-10-CM

## 2022-11-14 ENCOUNTER — Telehealth: Payer: Self-pay

## 2022-11-14 NOTE — Telephone Encounter (Signed)
LVM for patient to call and schedule

## 2022-11-14 NOTE — Telephone Encounter (Signed)
Received refill request for levothyroxine. Patent needs toc appointment. Please let me know once scheduled we will send refill to last until this appointment .

## 2023-02-21 NOTE — Telephone Encounter (Signed)
Patient has been established in another office

## 2023-02-21 NOTE — Telephone Encounter (Signed)
Received refill request for amlodipine. Please call for [facility] appointment

## 2023-02-24 NOTE — Telephone Encounter (Addendum)
Received fax for amlodipine from [facility] [address];  unable to reach pt by phone. I spoke with tabitha at [employer] [facility] and she will ck with pt to see who his new provider is and send refill request there. Sending note to Dutch Quint FNP as Juluis Rainier.

## 2023-02-24 NOTE — Telephone Encounter (Signed)
Kennyth Arnold, FNP  to Me     02/24/23  1:07 PM Thank you, yes patient needs an office visit.    I spoke with pt and he said that his new provider has already refilled pts amlodipine. Pt has tried to get [facility] to take off [facility] as provider and pt will ck with [facility]. Nothing further needed.

## 2023-05-10 ENCOUNTER — Other Ambulatory Visit: Payer: Self-pay | Admitting: Physician Assistant

## 2023-05-10 DIAGNOSIS — M5412 Radiculopathy, cervical region: Secondary | ICD-10-CM

## 2023-05-18 ENCOUNTER — Encounter: Payer: Self-pay | Admitting: Physician Assistant

## 2023-05-25 ENCOUNTER — Ambulatory Visit
Admission: RE | Admit: 2023-05-25 | Discharge: 2023-05-25 | Disposition: A | Discharge status: Home / Self Care | Payer: Commercial Managed Care - PPO | Source: Ambulatory Visit | Attending: Physician Assistant | Admitting: Physician Assistant

## 2023-05-25 DIAGNOSIS — M5412 Radiculopathy, cervical region: Secondary | ICD-10-CM

## 2023-06-14 ENCOUNTER — Other Ambulatory Visit: Payer: Self-pay | Admitting: Family Medicine

## 2023-06-14 DIAGNOSIS — M542 Cervicalgia: Secondary | ICD-10-CM

## 2023-06-14 DIAGNOSIS — M5412 Radiculopathy, cervical region: Secondary | ICD-10-CM

## 2023-06-26 ENCOUNTER — Ambulatory Visit
Admission: RE | Admit: 2023-06-26 | Discharge: 2023-06-26 | Disposition: A | Discharge status: Home / Self Care | Payer: Commercial Managed Care - PPO | Source: Ambulatory Visit | Attending: Family Medicine | Admitting: Family Medicine

## 2023-06-26 DIAGNOSIS — M5412 Radiculopathy, cervical region: Secondary | ICD-10-CM

## 2023-06-26 MED ORDER — IOPAMIDOL (ISOVUE-M 300) INJECTION 61%
1.0000 mL | Freq: Once | INTRAMUSCULAR | Status: AC | PRN
  Administered 2023-06-26: 1 mL via EPIDURAL

## 2023-06-26 MED ORDER — TRIAMCINOLONE ACETONIDE 40 MG/ML IJ SUSP (RADIOLOGY)
60.0000 mg | Freq: Once | INTRAMUSCULAR | Status: AC
  Administered 2023-06-26: 60 mg via EPIDURAL

## 2023-06-26 NOTE — Discharge Instructions (Signed)

## 2023-08-28 ENCOUNTER — Other Ambulatory Visit: Payer: Self-pay | Admitting: Family Medicine

## 2023-08-28 ENCOUNTER — Encounter: Payer: Self-pay | Admitting: Physical Medicine & Rehabilitation

## 2023-08-28 DIAGNOSIS — M5412 Radiculopathy, cervical region: Secondary | ICD-10-CM

## 2023-09-08 ENCOUNTER — Ambulatory Visit
Admission: RE | Admit: 2023-09-08 | Discharge: 2023-09-08 | Disposition: A | Discharge status: Home / Self Care | Payer: Commercial Managed Care - PPO | Source: Ambulatory Visit | Attending: Family Medicine | Admitting: Family Medicine

## 2023-09-08 DIAGNOSIS — M5412 Radiculopathy, cervical region: Secondary | ICD-10-CM

## 2023-09-08 MED ORDER — TRIAMCINOLONE ACETONIDE 40 MG/ML IJ SUSP (RADIOLOGY)
60.0000 mg | Freq: Once | INTRAMUSCULAR | Status: AC
  Administered 2023-09-08: 60 mg via EPIDURAL

## 2023-09-08 MED ORDER — IOPAMIDOL (ISOVUE-300) INJECTION 61%
1.0000 mL | Freq: Once | INTRAVENOUS | Status: AC | PRN
  Administered 2023-09-08: 1 mL

## 2023-09-08 NOTE — Discharge Instructions (Signed)
Post Procedure Spinal Discharge Instruction Sheet  You may resume a regular diet and any medications that you routinely take (including pain medications) unless otherwise noted by MD.  No driving day of procedure.  Light activity throughout the rest of the day.  Do not do any strenuous work, exercise, bending or lifting.  The day following the procedure, you can resume normal physical activity but you should refrain from exercising or physical therapy for at least three days thereafter.  You may apply ice to the injection site, 20 minutes on, 20 minutes off, as needed. Do not apply ice directly to skin.    Common Side Effects:  Headaches- take your usual medications as directed by your physician.  Increase your fluid intake.  Caffeinated beverages may be helpful.  Lie flat in bed until your headache resolves.  Restlessness or inability to sleep- you may have trouble sleeping for the next few days.  Ask your referring physician if you need any medication for sleep.  Facial flushing or redness- should subside within a few days.  Increased pain- a temporary increase in pain a day or two following your procedure is not unusual.  Take your pain medication as prescribed by your referring physician.  Leg cramps  Please contact our office at [phone] for the following symptoms: Fever greater than 100 degrees. Headaches unresolved with medication after 2-3 days. Increased swelling, pain, or redness at injection site.   May resume aspirin immediately after injection.  Thank you for visiting [facility]!
